# Patient Record
Sex: Female | Born: 1980 | Race: White | Hispanic: No | Marital: Married | Smoking: Former smoker
Health system: Southern US, Community
[De-identification: ages and names within clinical notes are randomized; demographics above are authoritative.]

## PROBLEM LIST (undated history)

## (undated) DIAGNOSIS — J189 Pneumonia, unspecified organism: Secondary | ICD-10-CM

## (undated) DIAGNOSIS — IMO0001 Reserved for inherently not codable concepts without codable children: Secondary | ICD-10-CM

## (undated) DIAGNOSIS — D869 Sarcoidosis, unspecified: Secondary | ICD-10-CM

## (undated) HISTORY — DX: Pneumonia, unspecified organism: J18.9

## (undated) HISTORY — PX: ESOPHAGOGASTRODUODENOSCOPY: SHX1529

---

## 1998-07-28 ENCOUNTER — Encounter: Payer: Self-pay | Admitting: Obstetrics and Gynecology

## 1998-07-28 ENCOUNTER — Ambulatory Visit (HOSPITAL_COMMUNITY): Admission: RE | Admit: 1998-07-28 | Discharge: 1998-07-28 | Payer: Self-pay | Admitting: Obstetrics and Gynecology

## 1998-09-14 ENCOUNTER — Inpatient Hospital Stay (HOSPITAL_COMMUNITY): Admission: AD | Admit: 1998-09-14 | Discharge: 1998-09-14 | Payer: Self-pay | Admitting: Obstetrics and Gynecology

## 1998-10-06 ENCOUNTER — Inpatient Hospital Stay (HOSPITAL_COMMUNITY): Admission: AD | Admit: 1998-10-06 | Discharge: 1998-10-08 | Payer: Self-pay | Admitting: Obstetrics and Gynecology

## 1998-11-18 ENCOUNTER — Other Ambulatory Visit: Admission: RE | Admit: 1998-11-18 | Discharge: 1998-11-18 | Payer: Self-pay | Admitting: Obstetrics and Gynecology

## 1999-11-29 ENCOUNTER — Encounter: Admission: RE | Admit: 1999-11-29 | Discharge: 1999-11-29 | Payer: Self-pay | Admitting: Gastroenterology

## 1999-11-29 ENCOUNTER — Encounter: Payer: Self-pay | Admitting: Gastroenterology

## 1999-12-01 ENCOUNTER — Encounter: Payer: Self-pay | Admitting: Gastroenterology

## 1999-12-01 ENCOUNTER — Ambulatory Visit (HOSPITAL_COMMUNITY): Admission: RE | Admit: 1999-12-01 | Discharge: 1999-12-01 | Payer: Self-pay | Admitting: Gastroenterology

## 1999-12-02 ENCOUNTER — Ambulatory Visit (HOSPITAL_COMMUNITY): Admission: RE | Admit: 1999-12-02 | Discharge: 1999-12-02 | Payer: Self-pay | Admitting: Gastroenterology

## 2000-05-15 ENCOUNTER — Other Ambulatory Visit: Admission: RE | Admit: 2000-05-15 | Discharge: 2000-05-15 | Payer: Self-pay | Admitting: Obstetrics and Gynecology

## 2002-03-14 ENCOUNTER — Other Ambulatory Visit: Admission: RE | Admit: 2002-03-14 | Discharge: 2002-03-14 | Payer: Self-pay | Admitting: Obstetrics & Gynecology

## 2002-03-14 ENCOUNTER — Other Ambulatory Visit: Admission: RE | Admit: 2002-03-14 | Discharge: 2002-03-14 | Payer: Self-pay | Admitting: Obstetrics and Gynecology

## 2002-07-30 ENCOUNTER — Inpatient Hospital Stay (HOSPITAL_COMMUNITY): Admission: AD | Admit: 2002-07-30 | Discharge: 2002-07-30 | Payer: Self-pay | Admitting: Obstetrics and Gynecology

## 2002-09-23 ENCOUNTER — Inpatient Hospital Stay (HOSPITAL_COMMUNITY): Admission: AD | Admit: 2002-09-23 | Discharge: 2002-09-25 | Payer: Self-pay | Admitting: Obstetrics and Gynecology

## 2002-10-29 ENCOUNTER — Other Ambulatory Visit: Admission: RE | Admit: 2002-10-29 | Discharge: 2002-10-29 | Payer: Self-pay | Admitting: Obstetrics and Gynecology

## 2004-06-30 ENCOUNTER — Other Ambulatory Visit: Admission: RE | Admit: 2004-06-30 | Discharge: 2004-06-30 | Payer: Self-pay | Admitting: Obstetrics and Gynecology

## 2004-09-21 ENCOUNTER — Encounter: Admission: RE | Admit: 2004-09-21 | Discharge: 2004-09-21 | Payer: Self-pay | Admitting: Family Medicine

## 2008-08-15 ENCOUNTER — Inpatient Hospital Stay (HOSPITAL_COMMUNITY): Admission: RE | Admit: 2008-08-15 | Discharge: 2008-08-17 | Payer: Self-pay | Admitting: Obstetrics and Gynecology

## 2010-06-14 LAB — CBC
HCT: 29 % — ABNORMAL LOW (ref 36.0–46.0)
HCT: 33.5 % — ABNORMAL LOW (ref 36.0–46.0)
Hemoglobin: 10.2 g/dL — ABNORMAL LOW (ref 12.0–15.0)
Hemoglobin: 11.8 g/dL — ABNORMAL LOW (ref 12.0–15.0)
MCHC: 35.1 g/dL (ref 30.0–36.0)
MCHC: 35.3 g/dL (ref 30.0–36.0)
MCV: 83.7 fL (ref 78.0–100.0)
MCV: 85.4 fL (ref 78.0–100.0)
Platelets: 172 10*3/uL (ref 150–400)
Platelets: 179 10*3/uL (ref 150–400)
RBC: 3.4 MIL/uL — ABNORMAL LOW (ref 3.87–5.11)
RBC: 4.01 MIL/uL (ref 3.87–5.11)
RDW: 13.1 % (ref 11.5–15.5)
RDW: 13.2 % (ref 11.5–15.5)
WBC: 10.5 10*3/uL (ref 4.0–10.5)
WBC: 12.7 10*3/uL — ABNORMAL HIGH (ref 4.0–10.5)

## 2010-06-14 LAB — RPR: RPR Ser Ql: NONREACTIVE

## 2010-07-23 NOTE — Procedures (Signed)
[place]  Patient:    Madison Holt, Madison Holt                     MRN: [id number] Proc. Date: 12/02/99 Adm. Date:  [id number] Attending:  Charna Elizabeth CC:         Meliton Rattan, M.D.   Procedure Report  DATE OF BIRTH:  May 15, 1980  PROCEDURE:  Esophagogastroduodenoscopy with gastroduodenoscopy.  ENDOSCOPIST:  Anselmo Rod, M.D.  INSTRUMENT USED:  Olympus video pan endoscope.  INDICATIONS:  Epigastric and right upper quadrant pain in a 30 year old white female who has peptic ulcer disease, esophagitis, gastritis.  PREPROCEDURE PREPARATION:  Informed consent was procured from the patient. The patient was fasted for eight hours prior to the procedure.  PREPROCEDURE PHYSICAL EXAMINATION:  VITAL SIGNS:  The patient had stable vital signs.  NECK:  Supple.  CHEST:  Clear to auscultation.  HEART:  S1 and S2 are regular.  ABDOMEN:  Soft with normal abdominal bowel sounds.  DESCRIPTION OF PROCEDURE:  The patient was placed in the left lateral decubitus position and sedated with 50 mg of Demerol and 7.5 mg of Versed intravenously.  Once the patient was adequately sedated and maintained on low flow oxygen, continuous cardiac monitoring, the Olympus video panendoscope was advanced through the mouth, over the tongue, into the esophagus under direct vision.  The entire esophagus appeared normal without evidence of ring, stricture, mass, lesions or esophagitis.  The scope was then advanced into the stomach.  There was mild antral gastritis.  No mass, polyps, erosions or ulcerations were seen.  The proximal small bowel to 70 cm appeared normal.  There was no acute outlet obstruction.  IMPRESSION:  Normal EGD except for mild antral gastritis.  RECOMMENDATIONS: 1. Continue Nexium for now. 2. Avoid all nonsteroidals. 3. Increase fluid and fiber in the diet. 4. Outpatient follow up in the next two weeks. 5. _____ p.r.n. for abdominal  spasms. DD:  11/01/99 TD:  12/03/99 Job: [id number]

## 2012-03-07 DIAGNOSIS — J189 Pneumonia, unspecified organism: Secondary | ICD-10-CM

## 2012-03-07 HISTORY — DX: Pneumonia, unspecified organism: J18.9

## 2013-02-04 ENCOUNTER — Inpatient Hospital Stay: Payer: Self-pay | Admitting: Internal Medicine

## 2013-02-04 LAB — CBC
HCT: 45 % (ref 35.0–47.0)
MCH: 29.2 pg (ref 26.0–34.0)
MCHC: 33.9 g/dL (ref 32.0–36.0)
MCV: 86 fL (ref 80–100)
RBC: 5.23 10*6/uL — ABNORMAL HIGH (ref 3.80–5.20)
RDW: 12.8 % (ref 11.5–14.5)
WBC: 11.1 10*3/uL — ABNORMAL HIGH (ref 3.6–11.0)

## 2013-02-04 LAB — BASIC METABOLIC PANEL
Anion Gap: 5 — ABNORMAL LOW (ref 7–16)
Chloride: 105 mmol/L (ref 98–107)
Co2: 25 mmol/L (ref 21–32)
EGFR (African American): >60
Glucose: 92 mg/dL (ref 65–99)
Osmolality: 272 (ref 275–301)
Sodium: 135 mmol/L — ABNORMAL LOW (ref 136–145)

## 2013-02-04 LAB — URINALYSIS, COMPLETE
Bilirubin,UR: NEGATIVE
Blood: NEGATIVE
Glucose,UR: NEGATIVE mg/dL (ref 0–75)
Hyaline Cast: 2
Nitrite: NEGATIVE
Ph: 5 (ref 4.5–8.0)
RBC,UR: 5 /HPF (ref 0–5)
Specific Gravity: 1.016 (ref 1.003–1.030)
Squamous Epithelial: 13
WBC UR: 6 /HPF (ref 0–5)

## 2013-02-05 LAB — CBC WITH DIFFERENTIAL/PLATELET
Basophil #: 0 10*3/uL (ref 0.0–0.1)
Basophil %: 0.2 %
HGB: 13.5 g/dL (ref 12.0–16.0)
Lymphocyte #: 0.5 10*3/uL — ABNORMAL LOW (ref 1.0–3.6)
Lymphocyte %: 7 %
MCH: 29.9 pg (ref 26.0–34.0)
MCHC: 34.1 g/dL (ref 32.0–36.0)
MCV: 87 fL (ref 80–100)
Monocyte %: 0.9 %
Neutrophil #: 6.5 10*3/uL (ref 1.4–6.5)
RBC: 4.53 10*6/uL (ref 3.80–5.20)
RDW: 12.7 % (ref 11.5–14.5)

## 2013-02-06 LAB — BASIC METABOLIC PANEL
BUN: 17 mg/dL (ref 7–18)
Calcium, Total: 8.7 mg/dL (ref 8.5–10.1)
Chloride: 111 mmol/L — ABNORMAL HIGH (ref 98–107)
Co2: 22 mmol/L (ref 21–32)
EGFR (Non-African Amer.): >60
Glucose: 158 mg/dL — ABNORMAL HIGH (ref 65–99)
Potassium: 4.4 mmol/L (ref 3.5–5.1)
Sodium: 141 mmol/L (ref 136–145)

## 2013-02-07 LAB — VANCOMYCIN, TROUGH: Vancomycin, Trough: 4 ug/mL — ABNORMAL LOW (ref 10–20)

## 2013-02-08 LAB — VANCOMYCIN, TROUGH: Vancomycin, Trough: 19 ug/mL (ref 10–20)

## 2013-02-09 LAB — CULTURE, BLOOD (SINGLE)

## 2014-06-27 NOTE — H&P (Signed)
PATIENT NAME:  Madison Holt, Madison Holt MR#:  [id number] DATE OF BIRTH:  Oct 09, 1980  DATE OF ADMISSION:  02/04/2013  PRIMARY CARE PHYSICIAN: None.   EMERGENCY ROOM PHYSICIAN: Dr. Cyril LoosenKinner.   CHIEF COMPLAINT: Shortness of breath and cough.   HISTORY OF PRESENT ILLNESS: A 34 year old female patient with history of shortness of breath and cough for the past 6 weeks. The patient went to urgent care 2 weeks ago and was given a course of Zithromax and prednisone along with inhalers. The patient's symptoms did not improve, so she went back to walk-in clinic again and was given Levaquin. She is almost done with Levaquin. She took 500 mg of Levaquin for 4 days and today is the 5th day, but because of persistent cough and shortness of breath, she comes to Emergency Room.   In the ER, the patient's chest x-ray showed persistent pneumonia on the right side. I was asked to admit the patient because of failure of outpatient therapy with pneumonia. The patient right now feels short of breath and feels tightness in the chest. Says that cough is so worse that her chest hurts and the pain in the chest goes to the back and always feels tight in the chest. Denies any fever now. The patient denies any recent history of travel. No history of COPD or asthma. The patient is working in a salon for a long time. Denies any exposure to different chemicals than usual. The patient states that no sick contacts.   PAST MEDICAL HISTORY: No hypertension or diabetes.   MEDICATIONS: Levaquin 500 mg daily and birth control pills.   ALLERGIES: THE PATIENT DENIES ANY ALLERGIES, BUT THAT SHE SAYS THAT SHE HAS FAMILY HISTORY OF ALLERGY TO MUCINEX, GIVES BLISTERS INSIDE THE THROAT, SO SHE DOES NOT WANT TO TAKE MUCINEX UNLESS IT IS REALLY NEEDED.   SOCIAL HISTORY: No smoking, no drinking, and the patient lives with her husband. She has 3 kids.   FAMILY HISTORY: No history of hypertension or diabetes. No COPD.   PAST SURGICAL HISTORY: None.    REVIEW OF SYSTEMS: CONSTITUTIONAL: Feels tired. Denies any fever. No weight loss.  EYES: No blurred vision.  ENT: No tinnitus. No epistaxis. No difficulty swallowing.  RESPIRATORY: Has cough and shortness of breath and painful respirations. The patient has been having cough and shortness of breath for 6 weeks and started to take antibiotics since 2 weeks.  CARDIOVASCULAR: Does have chest pain on the left side radiating to the back. No orthopnea. No pedal edema. No palpitations.  GASTROINTESTINAL: No nausea. No vomiting. No abdominal pain.  GENITOURINARY: No dysuria or hematuria.  ENDOCRINE: No polyuria or nocturia.  INTEGUMENTARY: No skin rashes.  MUSCULOSKELETAL: No joint pains.  NEUROLOGIC: No numbness or weakness. No dysarthria.  PSYCHIATRIC: No anxiety or insomnia.   PHYSICAL EXAMINATION:  VITAL SIGNS: Temperature 98.1, heart rate 94, blood pressure 159/86. Saturation is 99% on room air.  GENERAL: Alert, awake, oriented, not in distress, answering questions appropriately.  HEENT: normocephalic and atraumatic  EYes: PERLa,EOM intact. No scleral icterus. No conjunctivitis. Hearing is intact. The patient has no pharyngeal erythema.  NECK: No thyroid enlargement. No lymphadenopathy. No JVD. No carotid bruit.  RESPIRATORY: Bilaterally clear to auscultation. The patient has mild expiratory wheeze in all lung fields. Not using accessory muscles of respiration.  CARDIOVASCULAR: S1, S2 regular. No murmurs. Slightly tachycardic. Good pedal pulses, femoral pulses present. No extremity edema.  ABDOMEN: Soft, nontender, nondistended. Bowel sounds present. No hernias.  MUSCULOSKELETAL: Strength 5/5 in upper and  lower extremities. No kyphosis. No cyanosis.  SKIN: No skin rashes. Warm and dry.  LYMPH: No lymphadenopathy in cervical or axillary regions.  NEUROLOGIC: Cranial nerves II through XII are intact. Sensations are intact. Deep tendon reflexes 2+ bilaterally. No dysarthria or aphasia.   PSYCHIATRIC: Oriented to time, place, person.   LABORATORY DATA: WBC 11.1, hemoglobin 15.2, hematocrit 45, platelets 278.   ELECTROLYTES: Sodium 135, potassium 3.9, chloride 105, bicarb 25, BUN 20, creatinine 1.08, glucose 92. The patient's UA is hazy colored, trace leukocyte esterase, 3+ bacteria, and 6  WBC.   Chest x-ray shows patchy confluent increased density in the right upper lobe and also some increased density in the right lower lobe.   EKG shows normal sinus rhythm with 92 beats minute. No ST-T changes.   ASSESSMENT AND PLAN: The patient is a 34 year old female patient with community-acquired pneumonia, failed outpatient therapy. The patient still has right side pneumonia. The patient is admitted to hospitalist service on telemetry. Continue vancomycin and Zosyn and continue Solu-Medrol IV along with DuoNebs and follow how she responds.   THE PATIENT SAID SHE CANNOT TAKE CODEINE-CONTAINING COUGH PREPARATION, SO WE WILL HOLD ANY TUSSIONEX AT THIS TIME. Will give only Tessalon Perles for her cough as needed.   Continue gastrointestinal prophylaxis with proton pump inhibitors, \  , deep vein thrombosis prophylaxis with early ambulation.   TIME SPENT: About 55 minutes on history and physical.   ____________________________ Katha Hamming, MD sk:np D: 02/04/2013 16:33:17 ET T: 02/04/2013 20:12:38 ET JOB#: [id number]  cc: Katha Hamming, MD, <Dictator> Katha Hamming MD ELECTRONICALLY SIGNED 02/13/2013 23:01

## 2014-06-27 NOTE — Discharge Summary (Signed)
PATIENT NAME:  Madison Holt, Fayola MR#:  [id number] DATE OF BIRTH:  [birth year]  DATE OF ADMISSION:  02/04/2013 DATE OF DISCHARGE:  02/08/2013  DISCHARGE DIAGNOSES:  1. Pneumonia.  2. Left lung 5 mm nodule, needs to have followup.  3. Chest pain due to pneumonia.   CONDITION ON DISCHARGE: Stable.   CODE STATUS: FULL CODE.   MEDICATIONS ON DISCHARGE:  1. Prednisone 10 mg oral tablet, start at 60 and taper by 10 mg until complete.  2. Tramadol 50 mg oral 3 to 4 times a day as needed for pain.  3. Amoxicillin/clavulanate 500/125 mg oral tablet every 8 hours for 5 days.  4. Montelukast 10 mg oral tablet once a day.  5. Pantoprazole 40 mg delayed release tablet once a day.  6. Dextromethorphan/guaifenesin oral syrup 5 mL every 4 to 6 hours as needed for cough.   DIET ON DISCHARGE: Regular diet, consistency regular.   TIMEFRAME TO FOLLOW UP: Within 4 to 6 weeks. Advised to follow with PMD in 3 to 5 months. Followup chest x-ray for 5 mm nodule in left lung.   HISTORY OF PRESENTING ILLNESS: As per Dr. Luberta MutterKonidena on 02/04/2013. This is 34 year old female with history of shortness of breath and cough for 6 weeks. Went to urgent care center 3 weeks ago and was given a dose of Zithromax and prednisone with inhaler. Symptoms did not improve. She went back to walk-in clinic and was given Levaquin. She almost finished 4 days of Levaquin, but still she had cough and shortness of breath so decided to come to the Emergency Room. There was some pneumonia on the right side, so she was admitted for pneumonia, failure of outpatient therapy for further management. As she failed routine therapy, we started her on vancomycin and Zosyn IV and kept her in the hospital for symptomatic management of her cough and chest pain. She had slow but gradual improvement in her condition over next 4 days, and she was feeling significantly better, so we discharged her home on Augmentin to finish a total 10 days of therapy.   IMPORTANT  LABORATORY RESULTS IN THE HOSPITAL: White cell count on admission was 11,000. Hemoglobin was 15.2. Troponin less than 0.02. Creatinine 1.08. Urinalysis grossly negative. Chest x-ray, PA and lateral, showed patchy confluent increased density in right upper lobe with minimal increased density in the right lower lobe. May reflect residual pneumonia. Blood cultures were negative. Chest x-ray, PA and lateral, which was done on the 4th of December showed peripheral opacification over the right upper lobe without significant change which may be chronic, although cannot exclude acute infectious process. A 5 mm nodular opacity in left lung.   TOTAL TIME SPENT ON THIS DISCHARGE: 40 minutes.   ____________________________ Hope PigeonVaibhavkumar G. Elisabeth PigeonVachhani, MD vgv:gb D: 02/10/2013 18:02:01 ET T: 02/11/2013 00:30:10 ET JOB#: [id number]  cc: Hope PigeonVaibhavkumar G. Elisabeth PigeonVachhani, MD, <Dictator> Altamese DillingVAIBHAVKUMAR Shalia Bartko MD ELECTRONICALLY SIGNED 02/12/2013 9:22

## 2015-03-23 ENCOUNTER — Encounter: Payer: Self-pay | Admitting: Family Medicine

## 2015-03-23 ENCOUNTER — Ambulatory Visit (INDEPENDENT_AMBULATORY_CARE_PROVIDER_SITE_OTHER): Payer: Self-pay | Admitting: Family Medicine

## 2015-03-23 VITALS — BP 122/84 | HR 96 | Temp 98.3°F | Ht 64.0 in | Wt 142.8 lb

## 2015-03-23 DIAGNOSIS — R5383 Other fatigue: Secondary | ICD-10-CM

## 2015-03-23 DIAGNOSIS — Z13220 Encounter for screening for lipoid disorders: Secondary | ICD-10-CM

## 2015-03-23 DIAGNOSIS — R0600 Dyspnea, unspecified: Secondary | ICD-10-CM

## 2015-03-23 DIAGNOSIS — G4489 Other headache syndrome: Secondary | ICD-10-CM

## 2015-03-23 DIAGNOSIS — Z114 Encounter for screening for human immunodeficiency virus [HIV]: Secondary | ICD-10-CM

## 2015-03-23 DIAGNOSIS — R1011 Right upper quadrant pain: Secondary | ICD-10-CM

## 2015-03-23 NOTE — Progress Notes (Signed)
Dr. Karleen Hampshire T. Trae Bovenzi, MD, CAQ Sports Medicine [facility] and Sports Medicine [address] [place], [address] Phone: [phone] Fax: [phone]  03/23/2015  Patient: Madison Holt, MRN: [id number], DOB: 1980/06/03, 35 y.o.  Primary Physician:  Hannah Beat, MD   Chief Complaint  Patient presents with  . Establish Care  . Nasal Congestion  . Emesis    past 3 months   Subjective:   Madison Holt is a 35 y.o. very pleasant female patient who presents with the following:   pleasant young woman who presents with primary complaints of intermittent shortness of breath over the last few months with any short of breath going up and down her stairs , and a history that is significant for pneumonia 2 years prior where she was hospitalized for 7 days. No known history of COPD or asthma, and no significant smoking history.   She also has been having some intermittent right upper quadrant pain, nauseousness, emesis , and this is been ongoing for at least 2 or 3 months.  Oct, nov, dec throwing up - sick and was throwing up. Gave an antibiotic. This time the only thing is congestion. Has been having some breathing issues. Chest will be hurting and tired.   After pneumonia, had to see a lung doctor - cleared up and went away. In the fall, went away. Trouble going up the steps.  Breathing has been worse and worse since august.   Hot and cold that will happen.   Normal periods.   Emesis - not since christmas, will get nauseous.  Oct - 3 days straight.  Nov, 24 hours.  3 weeks ago.  103 fever at the time.   Will also get some RUQ pain.   Blurred vision. Ha.    Past Medical History, Surgical History, Social History, Family History, Problem List, Medications, and Allergies have been reviewed and updated if relevant.  There are no active problems to display for this patient.   Past Medical History  Diagnosis Date  . Pneumonia 2014    Hosp x 1 week     History reviewed. No pertinent past surgical history.  Social History   Social History  . Marital Status: Divorced    Spouse Name: N/A  . Number of Children: 3  . Years of Education: N/A   Occupational History  . Salon coordinator    Social History Main Topics  . Smoking status: Never Smoker   . Smokeless tobacco: Not on file  . Alcohol Use: 1.2 oz/week    2 Standard drinks or equivalent per week     Comment: soical  . Drug Use: Not on file  . Sexual Activity:    Partners: Male    Pharmacist, hospital Protection: Surgical     Comment: Vasectomy   Other Topics Concern  . Not on file   Social History Narrative   Works as Engineer, petroleum    Children: All boys, 16, 12, 6   Kay and Onalee Hua Hunter's daughter    Family History  Problem Relation Age of Onset  . Arthritis Mother   . Hyperlipidemia Mother   . Hyperlipidemia Father   . Diabetes Father   . Breast cancer Maternal Grandmother   . Hypertension Maternal Grandmother   . Colon cancer Maternal Grandfather   . Hypertension Maternal Grandfather   . Heart disease Paternal Grandfather     Allergies  Allergen Reactions  . Codeine     Other reaction(s): Hallucination  [facility]   Med[facility] and [facility].  ROS: GEN: Acute illness details above GI: Tolerating PO intake GU: maintaining adequate hydration and urination Pulm: No SOB Interactive and getting along well at home.  Otherwise, ROS is as per the HPI.   Objective:   BP 122/84 mmHg  Pulse 96  Temp(Src) 98.3 F (36.8 C) (Oral)  Ht 5\' 4"  (1.626 m)  Wt 142 lb 12.8 oz (64.774 kg)  BMI 24.50 kg/m2  SpO2 98%  LMP 03/01/2015  GEN: WDWN, NAD, Non-toxic, A & O x 3 HEENT: Atraumatic, Normocephalic. Neck supple. No masses, No LAD. Ears and Nose: No external deformity. CV: RRR, No M/G/R. No JVD. No thrill. No extra heart sounds. PULM: CTA B, no wheezes,  crackles, rhonchi. No retractions. No resp. distress. No accessory muscle use. ABD: S, NT, ND, +BS. No rebound. No HSM. EXTR: No c/c/e NEURO Normal gait.  PSYCH: Normally interactive. Conversant. Not depressed or anxious appearing.  Calm demeanor.     Laboratory and Imaging Data:  Assessment and Plan:   Dyspnea  Other fatigue - Plan: Vitamin B12, Basic metabolic panel, CBC with Differential/Platelet, Hepatic function panel, TSH, CANCELED: Basic metabolic panel, CANCELED: CBC with Differential/Platelet, CANCELED: Hepatic function panel, CANCELED: TSH, CANCELED: Vitamin B12  Screening, lipid - Plan: Lipid panel, CANCELED: Lipid panel  Screening for HIV (human immunodeficiency virus) - Plan: HIV antibody, CANCELED: HIV antibody  Abdominal pain, right upper quadrant - Plan: Hepatic function panel, Lipase, CANCELED: Hepatic function panel, CANCELED: Lipase  Other headache syndrome   multiple complaints of unclear origin. Dyspnea of unclear origin. Consideration of spirometry and chest x-ray.    for this and above, we are going to wait for 2 weeks until the patient's medical insurance becomes in effect.   fatigue, abdominal pain, headache, unclear origin. Check basic laboratories as above. Given cyclical abdominal pain and nauseousness with emesis, cholelithiasis that is symptomatic would be high in the differential.    intermittent migraine may also be possible.  Follow-up: after labs  Orders Placed This Encounter  Procedures  . Vitamin B12  . Basic metabolic panel  . CBC with Differential/Platelet  . Hepatic function panel  . TSH  . Lipid panel  . HIV antibody  . Lipase    Signed,  Madison Luebbe T. Kaiel Weide, MD   Patient's Medications   No medications on file

## 2015-04-10 ENCOUNTER — Other Ambulatory Visit (INDEPENDENT_AMBULATORY_CARE_PROVIDER_SITE_OTHER): Payer: BLUE CROSS/BLUE SHIELD

## 2015-04-10 DIAGNOSIS — R5383 Other fatigue: Secondary | ICD-10-CM

## 2015-04-10 DIAGNOSIS — Z114 Encounter for screening for human immunodeficiency virus [HIV]: Secondary | ICD-10-CM

## 2015-04-10 DIAGNOSIS — R1011 Right upper quadrant pain: Secondary | ICD-10-CM

## 2015-04-10 DIAGNOSIS — Z13220 Encounter for screening for lipoid disorders: Secondary | ICD-10-CM | POA: Diagnosis not present

## 2015-04-10 LAB — LIPID PANEL
CHOLESTEROL: 132 mg/dL (ref 0–200)
HDL: 68.6 mg/dL (ref >39.00)
LDL Cholesterol: 51 mg/dL (ref 0–99)
NonHDL: 63.7
Total CHOL/HDL Ratio: 2
Triglycerides: 63 mg/dL (ref 0.0–149.0)
VLDL: 12.6 mg/dL (ref 0.0–40.0)

## 2015-04-10 LAB — CBC WITH DIFFERENTIAL/PLATELET
BASOS PCT: 0.4 % (ref 0.0–3.0)
Basophils Absolute: 0 10*3/uL (ref 0.0–0.1)
Eosinophils Absolute: 0.1 10*3/uL (ref 0.0–0.7)
Eosinophils Relative: 2 % (ref 0.0–5.0)
HCT: 43.4 % (ref 36.0–46.0)
HEMOGLOBIN: 14.6 g/dL (ref 12.0–15.0)
LYMPHS ABS: 1 10*3/uL (ref 0.7–4.0)
LYMPHS PCT: 15.2 % (ref 12.0–46.0)
MCHC: 33.6 g/dL (ref 30.0–36.0)
MCV: 87.4 fl (ref 78.0–100.0)
MONO ABS: 0.5 10*3/uL (ref 0.1–1.0)
MONOS PCT: 7.5 % (ref 3.0–12.0)
Neutro Abs: 4.8 10*3/uL (ref 1.4–7.7)
Neutrophils Relative %: 74.9 % (ref 43.0–77.0)
Platelets: 257 10*3/uL (ref 150.0–400.0)
RBC: 4.96 Mil/uL (ref 3.87–5.11)
RDW: 13.1 % (ref 11.5–15.5)
WBC: 6.4 10*3/uL (ref 4.0–10.5)

## 2015-04-10 LAB — BASIC METABOLIC PANEL
BUN: 23 mg/dL (ref 6–23)
CALCIUM: 9.8 mg/dL (ref 8.4–10.5)
CO2: 27 meq/L (ref 19–32)
CREATININE: 1.1 mg/dL (ref 0.40–1.20)
Chloride: 102 mEq/L (ref 96–112)
GFR: 59.7 mL/min — ABNORMAL LOW (ref >60.00)
Glucose, Bld: 106 mg/dL — ABNORMAL HIGH (ref 70–99)
Potassium: 4.5 mEq/L (ref 3.5–5.1)
SODIUM: 137 meq/L (ref 135–145)

## 2015-04-10 LAB — HEPATIC FUNCTION PANEL
ALBUMIN: 4.3 g/dL (ref 3.5–5.2)
ALT: 14 U/L (ref 0–35)
AST: 16 U/L (ref 0–37)
Alkaline Phosphatase: 61 U/L (ref 39–117)
BILIRUBIN TOTAL: 0.5 mg/dL (ref 0.2–1.2)
Bilirubin, Direct: 0.1 mg/dL (ref 0.0–0.3)
Total Protein: 7.8 g/dL (ref 6.0–8.3)

## 2015-04-10 LAB — VITAMIN B12: VITAMIN B 12: 287 pg/mL (ref 211–911)

## 2015-04-10 LAB — TSH: TSH: 1.58 u[IU]/mL (ref 0.35–4.50)

## 2015-04-10 LAB — LIPASE: Lipase: 26 U/L (ref 11.0–59.0)

## 2015-04-11 LAB — HIV ANTIBODY (ROUTINE TESTING W REFLEX): HIV: NONREACTIVE

## 2015-04-15 ENCOUNTER — Encounter: Payer: Self-pay | Admitting: Family Medicine

## 2015-04-15 NOTE — Telephone Encounter (Signed)
Rena, Please call and triage patient. 

## 2015-04-27 ENCOUNTER — Ambulatory Visit (INDEPENDENT_AMBULATORY_CARE_PROVIDER_SITE_OTHER)
Admission: RE | Admit: 2015-04-27 | Discharge: 2015-04-27 | Disposition: A | Discharge status: Home / Self Care | Payer: BLUE CROSS/BLUE SHIELD | Source: Ambulatory Visit | Attending: Family Medicine | Admitting: Family Medicine

## 2015-04-27 ENCOUNTER — Encounter: Payer: Self-pay | Admitting: Family Medicine

## 2015-04-27 ENCOUNTER — Ambulatory Visit (INDEPENDENT_AMBULATORY_CARE_PROVIDER_SITE_OTHER): Payer: BLUE CROSS/BLUE SHIELD | Admitting: Family Medicine

## 2015-04-27 VITALS — BP 110/80 | HR 74 | Temp 98.4°F | Ht 64.0 in | Wt 145.5 lb

## 2015-04-27 DIAGNOSIS — R1011 Right upper quadrant pain: Secondary | ICD-10-CM

## 2015-04-27 DIAGNOSIS — K920 Hematemesis: Secondary | ICD-10-CM | POA: Diagnosis not present

## 2015-04-27 DIAGNOSIS — R0600 Dyspnea, unspecified: Secondary | ICD-10-CM

## 2015-04-27 DIAGNOSIS — R1010 Upper abdominal pain, unspecified: Secondary | ICD-10-CM | POA: Diagnosis not present

## 2015-04-27 DIAGNOSIS — R0609 Other forms of dyspnea: Secondary | ICD-10-CM | POA: Diagnosis not present

## 2015-04-27 DIAGNOSIS — G8929 Other chronic pain: Secondary | ICD-10-CM

## 2015-04-27 DIAGNOSIS — J849 Interstitial pulmonary disease, unspecified: Secondary | ICD-10-CM

## 2015-04-27 DIAGNOSIS — R110 Nausea: Secondary | ICD-10-CM

## 2015-04-27 DIAGNOSIS — E538 Deficiency of other specified B group vitamins: Secondary | ICD-10-CM

## 2015-04-27 MED ORDER — ALBUTEROL SULFATE (2.5 MG/3ML) 0.083% IN NEBU
2.5000 mg | INHALATION_SOLUTION | Freq: Once | RESPIRATORY_TRACT | Status: AC
  Administered 2015-04-27: 2.5 mg via RESPIRATORY_TRACT

## 2015-04-27 MED ORDER — FLUTICASONE-SALMETEROL 100-50 MCG/DOSE IN AEPB: 1.0000 | INHALATION_SPRAY | Freq: Two times a day (BID) | RESPIRATORY_TRACT | Status: DC

## 2015-04-27 MED ORDER — OMEPRAZOLE 20 MG PO CPDR: 20.0000 mg | DELAYED_RELEASE_CAPSULE | Freq: Two times a day (BID) | ORAL | Status: DC

## 2015-04-27 NOTE — Progress Notes (Signed)
Dr. Karleen Hampshire T. Dorann Davidson, MD, CAQ Sports [facility] Sports [facility] [address] [place], [address] Phone: [phone] Fax: [phone]  04/27/2015  Patient: Madison Holt, MRN: [id number], DOB: Apr 03, 1980, 35 y.o.  Primary Physician:  Hannah Beat, MD   Chief Complaint  Patient presents with  . Follow-up   Subjective:   Madison Holt is a 35 y.o. very pleasant female patient who presents with the following:  F/u ongoing multiple issues.   1. 04/15/2015: Lambert Mody pain - had some chest pain the night before and severe stomach pain the night before. Then bright red blood - the night. This has not recurred, but the patient did vomit up quite a bit of blood at that time.  This was bright red.  She denies taking in a significant amount of NSAIDs.  2. Dyspnea on exertion, dyspnea, minimal effort.  The patient reports that she has been short of breath for at least a couple of years with minimal exertion.  She is only able to walk up one flight of stairs, and then she gets short of breath very significantly.  Does not bother her quite as much when she is walking on flat surfaces.  She was sick earlier in the year from about October through early January off and on.  She did spirometry at Dr. Reita Cliche office in 2014, and that appears to have shown a moderate restrictive pattern.  She was placed on Dulera inhalers, and by memory she improved on them.  Still getting shortness of breath - only 1 flight of stairs.  Flat surfaces does not bother her.   Spirometry: pre and post. 2015 with obstructive pattern. Gold 2 pattern.  03/23/2015 Last OV with Hannah Beat, MD   pleasant young woman who presents with primary complaints of intermittent shortness of breath over the last few months with any short of breath going up and down her stairs , and a history that is significant for pneumonia 2 years prior where she was hospitalized for 7 days. No known history of COPD  or asthma, and no significant smoking history.   She also has been having some intermittent right upper quadrant pain, nauseousness, emesis , and this is been ongoing for at least 2 or 3 months.  Oct, nov, dec throwing up - sick and was throwing up. Gave an antibiotic. This time the only thing is congestion. Has been having some breathing issues. Chest will be hurting and tired.   After pneumonia, had to see a lung doctor - cleared up and went away. In the fall, went away. Trouble going up the steps.  Breathing has been worse and worse since august.   Hot and cold that will happen.   Normal periods.   Emesis - not since christmas, will get nauseous.  Oct - 3 days straight.  Nov, 24 hours.  3 weeks ago.  103 fever at the time.   Will also get some RUQ pain.   Blurred vision. Ha.  Past Medical History, Surgical History, Social History, Family History, Problem List, Medications, and Allergies have been reviewed and updated if relevant.  There are no active problems to display for this patient.   Past Medical History  Diagnosis Date  . Pneumonia 2014    Hosp x 1 week    No past surgical history on file.  Social History   Social History  . Marital Status: Divorced    Spouse Name: N/A  . Number of Children: 3  .  Years of Education: N/A   Occupational History  . Salon coordinator    Social History Main Topics  . Smoking status: Never Smoker   . Smokeless tobacco: Never Used  . Alcohol Use: 1.2 oz/week    2 Standard drinks or equivalent per week     Comment: soical  . Drug Use: No  . Sexual Activity:    Partners: Male    Birth Control/ Protection: Surgical     Comment: Vasectomy   Other Topics Concern  . Not on file   Social History Narrative   Works as Engineer, petroleum    Children: All boys, 16, 12, 6   Kay and Onalee Hua Hunter's daughter    Family History  Problem Relation Age of Onset  . Arthritis Mother   . Hyperlipidemia Mother   . Hyperlipidemia  Father   . Diabetes Father   . Breast cancer Maternal Grandmother   . Hypertension Maternal Grandmother   . Colon cancer Maternal Grandfather   . Hypertension Maternal Grandfather   . Heart disease Paternal Grandfather     Allergies  Allergen Reactions  . Codeine     Other reaction(s): Hallucination    Medication list reviewed and updated in full in [facility].   Medication list reviewed and updated in full in [facility].  ROS: GEN: Acute illness details above GI: Tolerating PO intake - bloody emesis as above GU: maintaining adequate hydration and urination Pulm: SOB as above Interactive and getting along well at home.  Otherwise, ROS is as per the HPI.  Objective:   BP 110/80 mmHg  Pulse 74  Temp(Src) 98.4 F (36.9 C) (Oral)  Ht 5\' 4"  (1.626 m)  Wt 145 lb 8 oz (65.998 kg)  BMI 24.96 kg/m2  LMP 04/19/2015  GEN: WDWN, NAD, Non-toxic, A & O x 3 HEENT: Atraumatic, Normocephalic. Neck supple. No masses, No LAD. Ears and Nose: No external deformity. CV: RRR, No M/G/R. No JVD. No thrill. No extra heart sounds. PULM: CTA B, no wheezes, crackles, rhonchi. No retractions. No resp. distress. No accessory muscle use. ABD: S, NT, ND, +BS. No rebound. No HSM. EXTR: No c/c/e NEURO Normal gait.  PSYCH: Normally interactive. Conversant. Not depressed or anxious appearing.  Calm demeanor.     Laboratory and Imaging Data: Results for orders placed or performed in visit on 04/10/15  Vitamin B12  Result Value Ref Range   Vitamin B-12 287 211 - 911 pg/mL  Basic metabolic panel  Result Value Ref Range   Sodium 137 135 - 145 mEq/L   Potassium 4.5 3.5 - 5.1 mEq/L   Chloride 102 96 - 112 mEq/L   CO2 27 19 - 32 mEq/L   Glucose, Bld 106 (H) 70 - 99 mg/dL   BUN 23 6 - 23 mg/dL   Creatinine, Ser 1.61 0.40 - 1.20 mg/dL   Calcium 9.8 8.4 - 09.6 mg/dL   GFR 04.54 (L) >09.81 mL/min  CBC with Differential/Platelet  Result Value Ref Range   WBC 6.4 4.0 - 10.5 K/uL   RBC  4.96 3.87 - 5.11 Mil/uL   Hemoglobin 14.6 12.0 - 15.0 g/dL   HCT 19.1 47.8 - 29.5 %   MCV 87.4 78.0 - 100.0 fl   MCHC 33.6 30.0 - 36.0 g/dL   RDW 62.1 30.8 - 65.7 %   Platelets 257.0 150.0 - 400.0 K/uL   Neutrophils Relative % 74.9 43.0 - 77.0 %   Lymphocytes Relative 15.2 12.0 - 46.0 %  Monocytes Relative 7.5 3.0 - 12.0 %   Eosinophils Relative 2.0 0.0 - 5.0 %   Basophils Relative 0.4 0.0 - 3.0 %   Neutro Abs 4.8 1.4 - 7.7 K/uL   Lymphs Abs 1.0 0.7 - 4.0 K/uL   Monocytes Absolute 0.5 0.1 - 1.0 K/uL   Eosinophils Absolute 0.1 0.0 - 0.7 K/uL   Basophils Absolute 0.0 0.0 - 0.1 K/uL  Hepatic function panel  Result Value Ref Range   Total Bilirubin 0.5 0.2 - 1.2 mg/dL   Bilirubin, Direct 0.1 0.0 - 0.3 mg/dL   Alkaline Phosphatase 61 39 - 117 U/L   AST 16 0 - 37 U/L   ALT 14 0 - 35 U/L   Total Protein 7.8 6.0 - 8.3 g/dL   Albumin 4.3 3.5 - 5.2 g/dL  TSH  Result Value Ref Range   TSH 1.58 0.35 - 4.50 uIU/mL  Lipid panel  Result Value Ref Range   Cholesterol 132 0 - 200 mg/dL   Triglycerides 13.2 0.0 - 149.0 mg/dL   HDL 44.01 >02.72 mg/dL   VLDL 53.6 0.0 - 64.4 mg/dL   LDL Cholesterol 51 0 - 99 mg/dL   Total CHOL/HDL Ratio 2    NonHDL 63.70   HIV antibody  Result Value Ref Range   HIV 1&2 Ab, 4th Generation NONREACTIVE NONREACTIVE  Lipase  Result Value Ref Range   Lipase 26.0 11.0 - 59.0 U/L    Dg Chest 2 View  04/27/2015  CLINICAL DATA:  COPD. EXAM: CHEST  2 VIEW COMPARISON:  02/07/2013.  02/04/2013. FINDINGS: Mediastinum stable. Heart size stable. Mild bilateral hilar fullness noted, adenopathy cannot be excluded . Diffuse bilateral pulmonary interstitial prominence noted. This suggests an active interstitial process including pneumonitis. Right upper lobe subsegmental atelectasis. Questionable nodular density noted in the left lung base. Previously identified nodule left mid lung field no longer identified. IMPRESSION: 1. Diffuse pulmonary interstitial prominence consistent  with active interstitial lung disease. Atelectatic changes right upper lobe. 2. Mild bilateral hilar adenopathy cannot be excluded. 3. Possible nodule left lung base. Previously identified nodule left mid lung no longer identified . Follow-up chest x-rays to demonstrate clearing suggested. Electronically Signed   By: Maisie Fus  Register   On: 04/27/2015 09:53     Assessment and Plan:   Interstitial lung disease (HCC)  Hematemesis with nausea - Plan: Ambulatory referral to Gastroenterology  Abdominal pain, chronic, right upper quadrant - Plan: Ambulatory referral to Gastroenterology  Dyspnea on exertion - Plan: Spirometry with graph, albuterol (PROVENTIL) (2.5 MG/3ML) 0.083% nebulizer solution 2.5 mg, DG Chest 2 View  B12 deficiency  Spirometry pre and post shows FEV1 / FVC at 72 No improvement post-bronchodilator FEV1/FVC in 2014 was in the 50's at Dr. Reita Cliche office  Concern for potential interstitial lung disease on x-ray. Obstructive pattern on spirometry may be from this. Pulmonology will need to be involved. Dr. Loreta Ave saw the patient today, and has already consulted Dr. Marchelle Gearing who is seeing the patient on 05/11/2015.  Begin b12 oral supplementation.   I started her on PPI for presumed ulcer vs gastritis causing bleeding and consulted GI.  Follow-up: Return in about 1 month (around 05/25/2015).  New Prescriptions   FLUTICASONE-SALMETEROL (ADVAIR) 100-50 MCG/DOSE AEPB    Inhale 1 puff into the lungs 2 (two) times daily.   OMEPRAZOLE (PRILOSEC) 20 MG CAPSULE    Take 1 capsule (20 mg total) by mouth 2 (two) times daily before a meal.   Orders Placed This Encounter  Procedures  .  DG Chest 2 View  . Ambulatory referral to Gastroenterology  . Spirometry with graph    Signed,  Karleen Hampshire T. Kareem Aul, MD   Patient's Medications  New Prescriptions   FLUTICASONE-SALMETEROL (ADVAIR) 100-50 MCG/DOSE AEPB    Inhale 1 puff into the lungs 2 (two) times daily.   OMEPRAZOLE (PRILOSEC) 20  MG CAPSULE    Take 1 capsule (20 mg total) by mouth 2 (two) times daily before a meal.  Previous Medications   No medications on file  Modified Medications   No medications on file  Discontinued Medications   No medications on file

## 2015-04-27 NOTE — Patient Instructions (Signed)
b12 - low normal deficiency

## 2015-04-27 NOTE — Progress Notes (Signed)
Pre visit review using our clinic review tool, if applicable. No additional management support is needed unless otherwise documented below in the visit note. 

## 2015-04-28 ENCOUNTER — Telehealth: Payer: Self-pay | Admitting: Family Medicine

## 2015-04-28 ENCOUNTER — Telehealth: Payer: Self-pay | Admitting: Internal Medicine

## 2015-04-28 DIAGNOSIS — J849 Interstitial pulmonary disease, unspecified: Secondary | ICD-10-CM

## 2015-04-28 NOTE — Telephone Encounter (Signed)
Attempted call, LMOM. Will try again.

## 2015-04-28 NOTE — Telephone Encounter (Signed)
Will hold in triage as TD is looking at scheduled for 3/6-3/7 to be opened prior to calling pt

## 2015-04-28 NOTE — Telephone Encounter (Signed)
Patient scheduled for Consult with MR 05/14/15 at 9am Pt plans to come next week to have labs drawn in our lab - all orders requested have been placed.  PFT order placed - aware that someone will call back to schedule this appt.  HRCT order placed - aware that someone will call back to schedule this appt as well.  Will hold in triage to follow up.

## 2015-04-28 NOTE — Telephone Encounter (Signed)
See my result note. I did my best to explain and go over everything with her.

## 2015-04-28 NOTE — Telephone Encounter (Signed)
Patient Returned call   [phone]

## 2015-04-28 NOTE — Telephone Encounter (Signed)
Called pt and LMTCB x1 Schedule now open. Will order tests after speaking with pt.

## 2015-04-28 NOTE — Telephone Encounter (Signed)
Pt walked in and would like results from chest x-ray.  States she has gotten "bad news" from Dr. Loreta Ave and has been referred to pulmonary.  Pt is requesting a call back ASAP.  Best number to call is [phone]

## 2015-04-28 NOTE — Telephone Encounter (Signed)
Dr Collene Mares referred me this patient 11:38 AM 04/28/2015 - has abnormal cxr ILD with mediastinal node and B symptoms of FUO. Went to GI with NV but she wants me to see patient first  Please call and give patien appt 05/11/15 or 05/12/15 to see me as new consult. However, beore she sees me  1) Do HRCT chest first and then CT chest with contrast - both at same setting - befoe she seems me High Resolution CT chest without contrast on ILD protocol. Only  Dr Lorin Picket or Dr Salvatore Marvel Dr. Vinnie Langton to read  2) Do full PFT before she sees me   3) Serum: ESR, ACE, ANA, DS-DNA, RF, anti-CCP, ssA, ssB, scl-70, ANCA screen, MPO, PR-3, Total CK,  RNP, Aldolase,  Hypersensitivity Pneumonitis Panel - beore she ses me  Diagnosis for all of above - ILD   Thanks  Dr. Brand Males, M.D., [facility].C.P Pulmonary and Critical Care Medicine Staff Physician [facility] Pager: [phone], If no answer or between  15:00h - 7:00h: call [phone]  04/28/2015 11:41 AM

## 2015-04-29 ENCOUNTER — Telehealth: Payer: Self-pay | Admitting: Internal Medicine

## 2015-04-29 DIAGNOSIS — R109 Unspecified abdominal pain: Secondary | ICD-10-CM

## 2015-04-29 DIAGNOSIS — R112 Nausea with vomiting, unspecified: Secondary | ICD-10-CM

## 2015-04-29 DIAGNOSIS — K920 Hematemesis: Secondary | ICD-10-CM

## 2015-04-29 NOTE — Telephone Encounter (Signed)
Dr Loreta Ave called saying she wants to tag CT abd/pevlis with contrast when she [name] does CT chest.   Dx is Nausea. Vomit, abd pain and hematemesis. Please ensure and confirm with me

## 2015-04-29 NOTE — Telephone Encounter (Signed)
Please advise PCC's on orders placed. thanks

## 2015-04-29 NOTE — Telephone Encounter (Signed)
I spoke to pt.  She is planning to come here Monday morning to get her lab work done.  We had pft opening at 9:00 so I scheduled her for that.  She is to have her CT's done on 3/1 at 3:00 at [facility].  Gave pt info for CT appt & pft appt.  Nothing further needed.

## 2015-04-30 NOTE — Telephone Encounter (Signed)
Abd/pelvic ct added and precert pending Tobe Sos

## 2015-04-30 NOTE — Telephone Encounter (Signed)
Order has been placed and spoke with Almyra Free as she is aware. MR is wanting confirmation this is scheduled thanks

## 2015-05-04 ENCOUNTER — Ambulatory Visit (INDEPENDENT_AMBULATORY_CARE_PROVIDER_SITE_OTHER): Payer: BLUE CROSS/BLUE SHIELD | Admitting: Internal Medicine

## 2015-05-04 DIAGNOSIS — J849 Interstitial pulmonary disease, unspecified: Secondary | ICD-10-CM | POA: Diagnosis not present

## 2015-05-04 LAB — PULMONARY FUNCTION TEST
DL/VA % pred: 93 %
DL/VA: 4.48 ml/min/mmHg/L
DLCO UNC % PRED: 88 %
DLCO UNC: 21.37 ml/min/mmHg
FEF 25-75 PRE: 1.73 L/s
FEF 25-75 Post: 2.51 L/sec
FEF2575-%Change-Post: 45 %
FEF2575-%PRED-PRE: 53 %
FEF2575-%Pred-Post: 76 %
FEV1-%CHANGE-POST: 9 %
FEV1-%PRED-POST: 95 %
FEV1-%Pred-Pre: 87 %
FEV1-Post: 2.95 L
FEV1-Pre: 2.69 L
FEV1FVC-%Change-Post: 8 %
FEV1FVC-%Pred-Pre: 84 %
FEV6-%Change-Post: 1 %
FEV6-%Pred-Post: 106 %
FEV6-%Pred-Pre: 104 %
FEV6-PRE: 3.86 L
FEV6-Post: 3.9 L
FEV6FVC-%PRED-PRE: 102 %
FEV6FVC-%Pred-Post: 102 %
FVC-%Change-Post: 1 %
FVC-%Pred-Post: 104 %
FVC-%Pred-Pre: 103 %
FVC-Post: 3.9 L
FVC-Pre: 3.86 L
POST FEV1/FVC RATIO: 76 %
POST FEV6/FVC RATIO: 100 %
Pre FEV1/FVC ratio: 70 %
Pre FEV6/FVC Ratio: 100 %
RV % pred: 100 %
RV: 1.51 L
TLC % PRED: 103 %
TLC: 5.21 L

## 2015-05-04 NOTE — Progress Notes (Signed)
PFT done today. 05/04/2015 

## 2015-05-06 ENCOUNTER — Ambulatory Visit (INDEPENDENT_AMBULATORY_CARE_PROVIDER_SITE_OTHER)
Admission: RE | Admit: 2015-05-06 | Discharge: 2015-05-06 | Disposition: A | Discharge status: Home / Self Care | Payer: BLUE CROSS/BLUE SHIELD | Source: Ambulatory Visit | Attending: Internal Medicine | Admitting: Internal Medicine

## 2015-05-06 ENCOUNTER — Encounter (HOSPITAL_COMMUNITY): Payer: BLUE CROSS/BLUE SHIELD

## 2015-05-06 ENCOUNTER — Other Ambulatory Visit: Payer: BLUE CROSS/BLUE SHIELD

## 2015-05-06 ENCOUNTER — Ambulatory Visit
Admission: RE | Admit: 2015-05-06 | Discharge: 2015-05-06 | Disposition: A | Discharge status: Home / Self Care | Payer: BLUE CROSS/BLUE SHIELD | Source: Ambulatory Visit | Attending: Internal Medicine | Admitting: Internal Medicine

## 2015-05-06 DIAGNOSIS — J849 Interstitial pulmonary disease, unspecified: Secondary | ICD-10-CM | POA: Diagnosis not present

## 2015-05-06 DIAGNOSIS — R109 Unspecified abdominal pain: Secondary | ICD-10-CM

## 2015-05-06 DIAGNOSIS — J8409 Other alveolar and parieto-alveolar conditions: Secondary | ICD-10-CM

## 2015-05-06 DIAGNOSIS — K920 Hematemesis: Secondary | ICD-10-CM

## 2015-05-06 DIAGNOSIS — R110 Nausea: Secondary | ICD-10-CM | POA: Diagnosis not present

## 2015-05-06 DIAGNOSIS — R112 Nausea with vomiting, unspecified: Secondary | ICD-10-CM | POA: Diagnosis not present

## 2015-05-06 MED ORDER — IOHEXOL 300 MG/ML  SOLN
100.0000 mL | Freq: Once | INTRAMUSCULAR | Status: AC | PRN
  Administered 2015-05-06: 100 mL via INTRAVENOUS

## 2015-05-06 NOTE — Telephone Encounter (Signed)
Did not see note until now; pt was seen 04/27/15.

## 2015-05-07 ENCOUNTER — Telehealth: Payer: Self-pay | Admitting: Internal Medicine

## 2015-05-07 LAB — RNP ANTIBODY: RIBONUCLEIC PROTEIN(ENA) ANTIBODY, IGG: NEGATIVE

## 2015-05-07 LAB — MPO/PR-3 (ANCA) ANTIBODIES
Myeloperoxidase Abs: <1
Serine Protease 3: <1

## 2015-05-07 LAB — CK TOTAL AND CKMB (NOT AT ARMC)
CK, MB: <0.7 ng/mL (ref 0.0–5.0)
Total CK: 27 U/L (ref 7–177)

## 2015-05-07 LAB — ANGIOTENSIN CONVERTING ENZYME: Angiotensin-Converting Enzyme: 60 U/L — ABNORMAL HIGH (ref 8–52)

## 2015-05-07 LAB — ANCA SCREEN W REFLEX TITER: ANCA Screen: POSITIVE — AB

## 2015-05-07 LAB — SJOGREN'S SYNDROME ANTIBODS(SSA + SSB)
SSA (RO) (ENA) ANTIBODY, IGG: NEGATIVE
SSB (La) (ENA) Antibody, IgG: <1

## 2015-05-07 LAB — ANTI-SCLERODERMA ANTIBODY: Scleroderma (Scl-70) (ENA) Antibody, IgG: <1

## 2015-05-07 LAB — RHEUMATOID FACTOR: Rhuematoid fact SerPl-aCnc: <10 IU/mL (ref <=14)

## 2015-05-07 LAB — C-ANCA TITER

## 2015-05-07 LAB — CYCLIC CITRUL PEPTIDE ANTIBODY, IGG: Cyclic Citrullin Peptide Ab: <16 Units

## 2015-05-07 LAB — SEDIMENTATION RATE: SED RATE: 27 mm/h — AB (ref 0–20)

## 2015-05-07 LAB — ANTI-DNA ANTIBODY, DOUBLE-STRANDED: DS DNA AB: 1 [IU]/mL

## 2015-05-07 NOTE — Telephone Encounter (Signed)
Results faxed.  Nothing further needed.

## 2015-05-07 NOTE — Telephone Encounter (Signed)
Please send Madison Holt 1980-04-09 - CT abdomen resuls to Dr Charna Elizabeth office  Ct Chest W Contrast  05/06/2015  CLINICAL DATA:  Interstitial lung disease. EXAM: CT CHEST, ABDOMEN, AND PELVIS WITH CONTRAST TECHNIQUE: Multidetector CT imaging of the chest, abdomen and pelvis was performed following the standard protocol during bolus administration of intravenous contrast. CONTRAST:  100 cc of Omnipaque 300 COMPARISON:  High-resolution chest CT of earlier today. No prior abdominal pelvic imaging. FINDINGS: CT CHEST CT CHEST FINDINGS Mediastinum/Nodes: No supraclavicular adenopathy. No axillary adenopathy. Normal heart size, without pericardial effusion. Thoracic adenopathy, as detailed on the prior exam. Example node within the azygoesophageal recess measures 1.8 cm on image 25/series 2. Right hilar adenopathy at 1.5 cm on image 20/series 2. Lungs/Pleura: No pleural fluid. Upper lobe predominant perilymphatic nodularity with architectural distortion again identified, and better evaluated on today's high-resolution study. Musculoskeletal: No acute osseous abnormality. CT ABDOMEN PELVIS FINDINGS Hepatobiliary: Mild hepatic steatosis, without focal liver lesion. Hepatomegaly, 20 cm craniocaudal. Variant lateral segment left liver lobe extending into the left upper quadrant. Normal gallbladder, without biliary ductal dilatation. Pancreas: Normal, without mass or ductal dilatation. Spleen: Normal in size, without focal abnormality. Adrenals/Urinary Tract: Normal adrenal glands. Normal kidneys, without hydronephrosis. Normal urinary bladder. Stomach/Bowel: Normal stomach, without wall thickening. Normal colon, appendix, and terminal ileum. Normal small bowel. Vascular/Lymphatic: Normal caliber of the aorta and branch vessels. Small retroperitoneal nodes. None are pathologic by size criteria. No pelvic sidewall adenopathy. Reproductive: Normal uterus and right adnexa. A left ovarian corpus luteal cyst measures 2.1 cm  on image 103/series 2. Other: Trace free pelvic fluid is likely physiologic. Musculoskeletal: No acute osseous abnormality. IMPRESSION: 1. 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 2. No evidence of subdiaphragmatic sarcoid. 3. Hepatomegaly and hepatic steatosis. 4. Left ovarian corpus luteal cyst. 5.  Trace free pelvic fluid is likely physiologic. Electronically Signed   By: Jeronimo Greaves M.D.   On: 05/06/2015 16:37   Ct Abdomen Pelvis W Contrast  05/06/2015  CLINICAL DATA:  Interstitial lung disease. EXAM: CT CHEST, ABDOMEN, AND PELVIS WITH CONTRAST TECHNIQUE: Multidetector CT imaging of the chest, abdomen and pelvis was performed following the standard protocol during bolus administration of intravenous contrast. CONTRAST:  100 cc of Omnipaque 300 COMPARISON:  High-resolution chest CT of earlier today. No prior abdominal pelvic imaging. FINDINGS: CT CHEST CT CHEST FINDINGS Mediastinum/Nodes: No supraclavicular adenopathy. No axillary adenopathy. Normal heart size, without pericardial effusion. Thoracic adenopathy, as detailed on the prior exam. Example node within the azygoesophageal recess measures 1.8 cm on image 25/series 2. Right hilar adenopathy at 1.5 cm on image 20/series 2. Lungs/Pleura: No pleural fluid. Upper lobe predominant perilymphatic nodularity with architectural distortion again identified, and better evaluated on today's high-resolution study. Musculoskeletal: No acute osseous abnormality. CT ABDOMEN PELVIS FINDINGS Hepatobiliary: Mild hepatic steatosis, without focal liver lesion. Hepatomegaly, 20 cm craniocaudal. Variant lateral segment left liver lobe extending into the left upper quadrant. Normal gallbladder, without biliary ductal dilatation. Pancreas: Normal, without mass or ductal dilatation. Spleen: Normal in size, without focal abnormality. Adrenals/Urinary Tract: Normal adrenal glands. Normal kidneys, without hydronephrosis. Normal urinary bladder. Stomach/Bowel: Normal  stomach, without wall thickening. Normal colon, appendix, and terminal ileum. Normal small bowel. Vascular/Lymphatic: Normal caliber of the aorta and branch vessels. Small retroperitoneal nodes. None are pathologic by size criteria. No pelvic sidewall adenopathy. Reproductive: Normal uterus and right adnexa. A left ovarian corpus luteal cyst measures 2.1 cm on image 103/series 2. Other: Trace free pelvic fluid is likely  physiologic. Musculoskeletal: No acute osseous abnormality. IMPRESSION: 1. 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 2. No evidence of subdiaphragmatic sarcoid. 3. Hepatomegaly and hepatic steatosis. 4. Left ovarian corpus luteal cyst. 5.  Trace free pelvic fluid is likely physiologic. Electronically Signed   By: Jeronimo Greaves M.D.   On: 05/06/2015 16:37   Ct Chest High Resolution  05/06/2015  CLINICAL DATA:  35 year old female with chronic shortness of breath since hospitalization for pneumonia in December 2014. Intermittent fevers, chills and nausea and vomiting approximately 1 week out of every month since October 2016. EXAM: CT CHEST WITHOUT CONTRAST TECHNIQUE: Multidetector CT imaging of the chest was performed following the standard protocol without intravenous contrast. High resolution imaging of the lungs, as well as inspiratory and expiratory imaging, was performed. COMPARISON:  No priors. FINDINGS: Mediastinum/Lymph Nodes: Heart size is normal. There is no significant pericardial fluid, thickening or pericardial calcification. Multiple borderline enlarged and mildly enlarged mediastinal and bilateral hilar lymph nodes measuring up to 1 cm in short axis in the subcarinal nodal station. Esophagus is unremarkable in appearance. No axillary lymphadenopathy. Lungs/Pleura: There is extensive micro and macronodularity throughout the lungs bilaterally. Nodules are distributed predominantly throughout the mid to upper lungs. The nodule distribution appears to be peribronchovascular  and subpleural (i.e., overall distribution is perilymphatic). There are several areas with conglomeration dense of nodules forming mass-like opacities, most evident in the posterior aspect of the right upper lobe abutting the major fissure. These findings are associated with some upward retraction of hilar structures. No pleural effusions. High-resolution images demonstrate no significant regions of ground-glass attenuation, parenchymal banding or frank honeycombing. Inspiratory and expiratory imaging demonstrates minimal air trapping. Upper abdomen: Unremarkable. Musculoskeletal: There are no aggressive appearing lytic or blastic lesions noted in the visualized portions of the skeleton. IMPRESSION: 1. Imaging findings on today's examination are strongly suggestive of sarcoidosis, as discussed above. Clinical correlation is suggested. 2. No finding to suggest interstitial lung disease. Electronically Signed   By: Trudie Reed M.D.   On: 05/06/2015 15:48

## 2015-05-11 ENCOUNTER — Ambulatory Visit (INDEPENDENT_AMBULATORY_CARE_PROVIDER_SITE_OTHER): Payer: BLUE CROSS/BLUE SHIELD | Admitting: Internal Medicine

## 2015-05-11 ENCOUNTER — Encounter: Payer: Self-pay | Admitting: Internal Medicine

## 2015-05-11 VITALS — BP 110/78 | HR 80 | Ht 64.0 in | Wt 146.8 lb

## 2015-05-11 DIAGNOSIS — J849 Interstitial pulmonary disease, unspecified: Secondary | ICD-10-CM | POA: Insufficient documentation

## 2015-05-11 DIAGNOSIS — D860 Sarcoidosis of lung: Secondary | ICD-10-CM | POA: Insufficient documentation

## 2015-05-11 DIAGNOSIS — R599 Enlarged lymph nodes, unspecified: Secondary | ICD-10-CM | POA: Diagnosis not present

## 2015-05-11 DIAGNOSIS — R590 Localized enlarged lymph nodes: Secondary | ICD-10-CM

## 2015-05-11 DIAGNOSIS — R918 Other nonspecific abnormal finding of lung field: Secondary | ICD-10-CM | POA: Diagnosis not present

## 2015-05-11 LAB — HYPERSENSITIVITY PNEUMONITIS
A. FUMIGATUS #1 ABS: NEGATIVE
A. Pullulans Abs: NEGATIVE
MICROPOLYSPORA FAENI IGG: NEGATIVE
Pigeon Serum Abs: NEGATIVE
THERMOACTINOMYCES VULGARIS IGG: NEGATIVE
Thermoact. Saccharii: NEGATIVE

## 2015-05-11 LAB — ALDOLASE: ALDOLASE: 3.3 U/L (ref 3.3–10.3)

## 2015-05-11 NOTE — Progress Notes (Signed)
Subjective:    Patient ID: Madison Holt, female    DOB: 26-May-1980, 35 y.o.   MRN: [id number] PCP Hannah Beat, MD Dr Loreta Ave  HPI  IOV 05/11/2015  Chief Complaint  Patient presents with  . Pulmonary Consult    Pt referred by Dr. Loreta Ave for SOB and chest tightness. Pt is here after CT chest, CT abdoment, and PFT. Pt c/o DOE, chest tightness with SOB chest tightness resolves with rest, dry cough x 5 months.     35 year old female referred by Dr. Loreta Ave. 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 She is exposed to the sprays. She reports that in end of 2014 she was admitted to [facility] after having pneumonia following bronchitis. Post discharge she was given a diagnosis of pneumonia again in February 2015. Ever since that first pneumonia and end 2014 she's had dyspnea on exertion. This is now progressively worse. Climbing stairs makes her dyspneic. When she feels hot she gets dyspneic. She also has associated chest tightness in the center of the chest that happens periodically. It does not appear that dust or pollen make her worse but temperature changes scan. Resting and then taking a deep breath helps her. Symptoms are rated as moderate to severe and they are definitely progressive. This no associated wheezing. The might be a mild cough. Then in October 2016 she started having B symptoms. These are characterized by fever chills and low appetite. These happen for 1 week each month since then. During this time she does not eat much. Couple of these episodes of been associated with nausea vomiting and some hematocrit Mrs. She is extreme short of breath and is worried. Advair does help but makes her tachycardic.   Full pulmonary function test 05/04/2015 is normal   Autoimmune workup 05/06/2015 extensive panel is essentially normal except angiotensin-converting enzyme is elevated at 60 and c-ANCA  is trace positive at 1:40 but PR 3 and MPO antibodies are  negative.  05/06/2015 she also had CT chest with contrast and high resolution CT chest without contrast: There classic pulmonary infiltrates and hilar and subcarinal lymphadenopathy. Overall pattern is consistent with sarcoidosis radiologically according to the radiologist. I personally visualized this film.   Walking desat test 185 feet  X 3 laps on RA -> did not desatrurate    has a past medical history of Pneumonia (2014).   reports that she quit smoking about 17 years ago. Her smoking use included Cigarettes. She has a 5 pack-year smoking history. She has never used smokeless tobacco.  No past surgical history on file.  Allergies  Allergen Reactions  . Codeine     Other reaction(s): Hallucination     There is no immunization history on file for this patient.  Family History  Problem Relation Age of Onset  . Arthritis Mother   . Hyperlipidemia Mother   . Hyperlipidemia Father   . Diabetes Father   . Breast cancer Maternal Grandmother   . Hypertension Maternal Grandmother   . Colon cancer Maternal Grandfather   . Hypertension Maternal Grandfather   . Heart disease Paternal Grandfather      Current outpatient prescriptions:  .  acetaminophen (TYLENOL) 325 MG tablet, Take 650 mg by mouth every 6 (six) hours as needed., Disp: , Rfl:  .  Fluticasone-Salmeterol (ADVAIR) 100-50 MCG/DOSE AEPB, Inhale 1 puff into the lungs 2 (two) times daily., Disp: 60 each, Rfl: 5     Review of Systems  Constitutional: Negative for fever and unexpected weight change.  HENT: Negative for congestion, dental problem, ear pain, nosebleeds, postnasal drip, rhinorrhea, sinus pressure, sneezing, sore throat and trouble swallowing.   Eyes: Negative for redness and itching.  Respiratory: Positive for cough, chest tightness and shortness of breath. Negative for wheezing.   Cardiovascular: Negative for palpitations and leg swelling.  Gastrointestinal: Negative for nausea and vomiting.   Genitourinary: Negative for dysuria.  Musculoskeletal: Negative for joint swelling.  Skin: Negative for rash.  Neurological: Negative for headaches.  Hematological: Does not bruise/bleed easily.  Psychiatric/Behavioral: Negative for dysphoric mood. The patient is not nervous/anxious.        Objective:   Physical Exam  Constitutional: She is oriented to person, place, and time. She appears well-developed and well-nourished. No distress.  HENT:  Head: Normocephalic and atraumatic.  Right Ear: External ear normal.  Left Ear: External ear normal.  Mouth/Throat: Oropharynx is clear and moist. No oropharyngeal exudate.  Eyes: Conjunctivae and EOM are normal. Pupils are equal, round, and reactive to light. Right eye exhibits no discharge. Left eye exhibits no discharge. No scleral icterus.  Neck: Normal range of motion. Neck supple. No JVD present. No tracheal deviation present. No thyromegaly present.  Cardiovascular: Normal rate, regular rhythm, normal heart sounds and intact distal pulses.  Exam reveals no gallop and no friction rub.   No murmur heard. Pulmonary/Chest: Effort normal and breath sounds normal. No respiratory distress. She has no wheezes. She has no rales. She exhibits no tenderness.  Abdominal: Soft. Bowel sounds are normal. She exhibits no distension and no mass. There is no tenderness. There is no rebound and no guarding.  Musculoskeletal: Normal range of motion. She exhibits no edema or tenderness.  Lymphadenopathy:    She has no cervical adenopathy.  Neurological: She is alert and oriented to person, place, and time. She has normal reflexes. No cranial nerve deficit. She exhibits normal muscle tone. Coordination normal.  Skin: Skin is warm and dry. No rash noted. She is not diaphoretic. No erythema. No pallor.  Psychiatric: She has a normal mood and affect. Her behavior is normal. Judgment and thought content normal.  Was in a bit of tears at end of interview - due to  new medical diagnosis and feeling overwhelmed  Vitals reviewed.   Filed Vitals:   05/11/15 0907  BP: 110/78  Pulse: 80  Height: 5\' 4"  (1.626 m)  Weight: 146 lb 12.8 oz (66.588 kg)  SpO2: 98%         Assessment & Plan:     ICD-9-CM ICD-10-CM   1. Pulmonary infiltrate present on computed tomography 793.19 R91.8   2. 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 I think the trace positive c-CNCA is false positive because MPO and PR 3 specific antibodies are negative. Therefore do not think she has vasculitis. I've explained this to her.  She probably will benefit best from and a bronchial ultrasound guided biopsy of the mediastinal nodes including subcarinal node and transbronchial biopsy of the pulmonary infiltrates associated with bronchoalveolar lavage for cell count and differential and CD4: CD8 ratios. Despite all of the above there is a 20% probability that we will have nondiagnostic results. Therefore it might be best that procedures attempted by thoracic surgery Dr. Dorris FetchHendrickson who can then converted to a mediastinoscopy if needed in the same setting.  I have discussed this with Dr. Dorris FetchHendrickson and he has agreed to see the patient.  This has been communicated with the patient the following day on 05/12/2015   I will see her for follow-up visit someone week after her procedure    Dr. [name], M.D., [facility].C.P Pulmonary and Critical Care Medicine Staff Physician  System [address] Pulmonary and Critical Care Pager: [phone], If no answer or between  15:00h - 7:00h: call [phone]  05/12/2015 6:22 PM

## 2015-05-11 NOTE — Patient Instructions (Signed)
ICD-9-CM ICD-10-CM   1. Pulmonary infiltrate present on computed tomography 793.19 R91.8   2. 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

## 2015-05-12 ENCOUNTER — Telehealth: Payer: Self-pay | Admitting: Internal Medicine

## 2015-05-12 DIAGNOSIS — R918 Other nonspecific abnormal finding of lung field: Secondary | ICD-10-CM

## 2015-05-12 NOTE — Telephone Encounter (Signed)
Pt is calling checking the status of bronch being scheduled.  Please advise MR thanks

## 2015-05-12 NOTE — Telephone Encounter (Signed)
Referral to dr hendrickson placed.  Nothing further needed.

## 2015-05-12 NOTE — Telephone Encounter (Signed)
i just heard from Dr Dorris FetchHendrickson of [facility] . He has agreed to do procedure of bronch. ANd if non0diagnostic he will be able to do mediastonasccopy same settting. Please refer to DR Dorris FetchHendrickson and give her fu appt to see me some 1 week after his procedure date

## 2015-05-18 ENCOUNTER — Other Ambulatory Visit: Payer: Self-pay | Admitting: *Deleted

## 2015-05-18 ENCOUNTER — Telehealth: Payer: Self-pay | Admitting: Internal Medicine

## 2015-05-18 ENCOUNTER — Institutional Professional Consult (permissible substitution) (INDEPENDENT_AMBULATORY_CARE_PROVIDER_SITE_OTHER): Payer: BLUE CROSS/BLUE SHIELD | Admitting: Thoracic Surgery (Cardiothoracic Vascular Surgery)

## 2015-05-18 VITALS — BP 116/70 | HR 72 | Resp 16 | Ht 63.5 in | Wt 145.0 lb

## 2015-05-18 DIAGNOSIS — R918 Other nonspecific abnormal finding of lung field: Secondary | ICD-10-CM | POA: Diagnosis not present

## 2015-05-18 DIAGNOSIS — R599 Enlarged lymph nodes, unspecified: Secondary | ICD-10-CM | POA: Diagnosis not present

## 2015-05-18 DIAGNOSIS — R590 Localized enlarged lymph nodes: Secondary | ICD-10-CM

## 2015-05-18 NOTE — Telephone Encounter (Signed)
Stacey with CT calling to speak with Robynn PaneElise specifically about getting a Super D made of this patient's scan.  States that she would like to leave this for Robynn Panelise for 05/19/15 to call her back.  [facility].

## 2015-05-18 NOTE — Progress Notes (Signed)
PCP is Spencer Copland, MD Referring Provider is Ramaswamy, Murali, MD  Chief Complaint  Patient presents with  . Shortness of Breath    on exertion after bronchitis/pneumonia----infiltrates.....eval for BRONCH...PFT 05/04/15, CT CHEST 05/06/15    HPI: A 34-year-old woman with a chief complaint of persistent shortness of breath.  Mrs. Eastwood is a 34-year-old woman. She is a former smoker having quit in 1999. 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 She first became ill in the fall of 2014. She was complaining of cough and shortness of breath. She was diagnosed with pneumonia. She was treated with antibiotics and was in the hospital for about a week. She improved but said her symptoms never completely went away. She was treated again for pneumonia in January 2015. She continued to have intermittent issues with cough and shortness of breath with exertion. In October 2016 she had worsening shortness of breath along with fevers and chills. She was treated with antibiotics. Over the past 4 months she's had about 1 week each month where she has fevers and chills and general malaise. She is persistently short of breath with walking or talking and can no longer run.  She has not had any change in weight over the past 3 months. She has no known exposures.  She saw Dr. Ramaswamy. PFTs were unremarkable. An autoimmune workup showed angiotensin-converting enzyme elevated at 60 and c-ANCA is trace positive at 1:40 but PR 3 and MPO antibodies are negative.  A CT of the chest abdomen and pelvis showed findings suspicious for sarcoidosis.  Past Medical History  Diagnosis Date  . Pneumonia 2014    Hosp x 1 week    No past surgical history on file.  Family History  Problem Relation Age of Onset  . Arthritis Mother   . Hyperlipidemia Mother   . Hyperlipidemia Father   . Diabetes Father   . Breast cancer Maternal Grandmother   . Hypertension Maternal Grandmother   . Colon cancer Maternal Grandfather    . Hypertension Maternal Grandfather   . Heart disease Paternal Grandfather     Social History Social History  Substance Use Topics  . Smoking status: Former Smoker -- 1.00 packs/day for 5 years    Types: Cigarettes    Quit date: 03/07/1998  . Smokeless tobacco: Never Used  . Alcohol Use: 1.2 oz/week    2 Standard drinks or equivalent per week     Comment: soical    Current Outpatient Prescriptions  Medication Sig Dispense Refill  . acetaminophen (TYLENOL) 325 MG tablet Take 650 mg by mouth every 6 (six) hours as needed.    . Fluticasone-Salmeterol (ADVAIR) 100-50 MCG/DOSE AEPB Inhale 1 puff into the lungs 2 (two) times daily. 60 each 5   No current facility-administered medications for this visit.    Allergies  Allergen Reactions  . Codeine     Other reaction(s): Hallucination    Review of Systems  Constitutional: Positive for fever, chills and activity change. Negative for appetite change and unexpected weight change.  Eyes: Positive for visual disturbance (blurry).  Respiratory: Positive for cough, chest tightness and shortness of breath.   Cardiovascular: Positive for chest pain. Negative for leg swelling.  Gastrointestinal: Negative for abdominal pain and blood in stool.  Genitourinary: Negative for dysuria and hematuria.  Musculoskeletal: Negative for joint swelling and arthralgias.  Neurological: Positive for dizziness and light-headedness. Negative for syncope.  Hematological: Negative for adenopathy. Does not bruise/bleed easily.    BP 116/70 mmHg  Pulse   72  Resp 16  Ht 5' 3.5" (1.613 m)  Wt 145 lb (65.772 kg)  BMI 25.28 kg/m2  SpO2 99%  LMP 04/19/2015 Physical Exam  Constitutional: She is oriented to person, place, and time. She appears well-developed and well-nourished. No distress.  HENT:  Head: Normocephalic and atraumatic.  Eyes: Conjunctivae and EOM are normal. Pupils are equal, round, and reactive to light. No scleral icterus.  Neck: Neck  supple. No thyromegaly present.  Cardiovascular: Normal rate, regular rhythm, normal heart sounds and intact distal pulses.  Exam reveals no gallop and no friction rub.   No murmur heard. Pulmonary/Chest: Effort normal and breath sounds normal. No respiratory distress. She has no wheezes. She has no rales.  Abdominal: Soft. She exhibits no distension. There is no tenderness.  Musculoskeletal: She exhibits no edema.  Lymphadenopathy:    She has no cervical adenopathy.  Neurological: She is alert and oriented to person, place, and time. No cranial nerve deficit.  Skin: Skin is warm and dry.  Vitals reviewed.    Diagnostic Tests: CT CHEST, ABDOMEN, AND PELVIS WITH CONTRAST  TECHNIQUE: Multidetector CT imaging of the chest, abdomen and pelvis was performed following the standard protocol during bolus administration of intravenous contrast.  CONTRAST: 100 cc of Omnipaque 300  COMPARISON: High-resolution chest CT of earlier today. No prior abdominal pelvic imaging.  FINDINGS: CT CHEST  CT CHEST FINDINGS  Mediastinum/Nodes: No supraclavicular adenopathy. No axillary adenopathy. Normal heart size, without pericardial effusion. Thoracic adenopathy, as detailed on the prior exam. Example node within the azygoesophageal recess measures 1.8 cm on image 25/series 2.  Right hilar adenopathy at 1.5 cm on image 20/series 2.  Lungs/Pleura: No pleural fluid. Upper lobe predominant perilymphatic nodularity with architectural distortion again identified, and better evaluated on today's high-resolution study.  Musculoskeletal: No acute osseous abnormality.  CT ABDOMEN PELVIS FINDINGS  Hepatobiliary: Mild hepatic steatosis, without focal liver lesion. Hepatomegaly, 20 cm craniocaudal. Variant lateral segment left liver lobe extending into the left upper quadrant. Normal gallbladder, without biliary ductal dilatation.  Pancreas: Normal, without mass or ductal  dilatation.  Spleen: Normal in size, without focal abnormality.  Adrenals/Urinary Tract: Normal adrenal glands. Normal kidneys, without hydronephrosis. Normal urinary bladder.  Stomach/Bowel: Normal stomach, without wall thickening. Normal colon, appendix, and terminal ileum. Normal small bowel.  Vascular/Lymphatic: Normal caliber of the aorta and branch vessels. Small retroperitoneal nodes. None are pathologic by size criteria. No pelvic sidewall adenopathy.  Reproductive: Normal uterus and right adnexa. A left ovarian corpus luteal cyst measures 2.1 cm on image 103/series 2.  Other: Trace free pelvic fluid is likely physiologic.  Musculoskeletal: No acute osseous abnormality.  IMPRESSION: 1. 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 2. No evidence of subdiaphragmatic sarcoid. 3. Hepatomegaly and hepatic steatosis. 4. Left ovarian corpus luteal cyst. 5. Trace free pelvic fluid is likely physiologic.   Electronically Signed  By: Kyle Talbot M.D.  On: 05/06/2015 16:37  I personally reviewed the CT chest and concur with findings noted above  Impression: Mrs. Gorczyca is a 34-year-old woman with progressive dyspnea and persistent cough who has pulmonary infiltrates and mediastinal and hilar adenopathy. These findings are highly suspicious for sarcoidosis, but she does not yet have a diagnosis. AFB or fungal diseases and lymphoma are also in the differential.  She needs a biopsy to confirm the diagnosis.  I recommended to her that we proceed with bronchoscopy and endobronchial ultrasound to sample the pulmonary infiltrates (biopsy and bronchoalveolar lavage) and mediastinal and hilar lymph nodes.   If those are negative we would proceed with mediastinoscopy at the same setting to obtain better samples of the mediastinal lymph nodes. I reviewed the general nature of the procedure with her. She understands this will be done in the operating room under  general anesthesia. We will plan to do this on an outpatient basis. She understands the intraoperative decision making regarding whether to proceed with mediastinoscopy. I reviewed the indications, risks, benefits, and alternatives. She understands the risks include, but are not limited to bleeding, pneumothorax, infection, recurrent nerve injury leading to hoarseness, esophageal injury, as well as the possibility of other unforeseeable complications.  She accepts the risks and wishes to proceed  Plan: Bronchoscopy, endobronchial ultrasound, possible mediastinoscopy on Friday, 06/05/2015.  Steven C Hendrickson, MD [facility] [phone]  

## 2015-05-20 NOTE — Telephone Encounter (Signed)
Called and spoke to ElkinLisa at CT. Misty StanleyLisa is unsure why Misty StanleyStacey called and will have her return my call on 3.16.17. Will await call.

## 2015-05-22 NOTE — Telephone Encounter (Signed)
Called and spoke with Stacy. She states that the issue has been resolved and nothing further is needed. She states that the call should have been sent to Dr. Robby SermonHendricks office instead of ours.

## 2015-05-25 ENCOUNTER — Ambulatory Visit: Payer: BLUE CROSS/BLUE SHIELD | Admitting: Family Medicine

## 2015-05-27 ENCOUNTER — Inpatient Hospital Stay (HOSPITAL_COMMUNITY)
Admission: RE | Admit: 2015-05-27 | Discharge: 2015-05-27 | Disposition: A | Discharge status: Home / Self Care | Payer: BLUE CROSS/BLUE SHIELD | Source: Ambulatory Visit

## 2015-05-27 ENCOUNTER — Other Ambulatory Visit (HOSPITAL_COMMUNITY): Payer: Self-pay | Admitting: *Deleted

## 2015-05-27 NOTE — Pre-Procedure Instructions (Signed)
    Madison BushmanJennifer L Meditz  05/27/2015      [id number] [name] - [address] [name] [id number] Phone: [phone] Fax: [phone]    Your procedure is scheduled on 06-05-2015  Friday    [facility].M.   Call this number if you have problems the morning of surgery:  [phone]   Remember:  Do not eat food or drink liquids after midnight.   Take these medicines the morning of surgery with A SIP OF WATER Tylenol if needed,Advair inhaler,eye drops as needed for dry eyes   Do not wear jewelry, make-up or nail polish.   Do not wear lotions, powders, or perfumes.  You may not wear deodorant.  Do not shave 48 hours prior to surgery.     Do not bring valuables to the hospital.  [facility] is not responsible for any belongings or valuables.  Contacts, dentures or bridgework may not be worn into surgery.  Leave your suitcase in the car.  After surgery it may be brought to your room.  For patients admitted to the hospital, discharge time will be determined by your treatment team.  Patients discharged the day of surgery will not be allowed to drive home.    Special instructions: See attached Sheet for instructions on CHG showers  Please read over the following fact sheets that you were given. Pain Booklet, Coughing and Deep Breathing and Surgical Site Infection Prevention

## 2015-06-01 ENCOUNTER — Encounter (HOSPITAL_COMMUNITY): Payer: Self-pay

## 2015-06-01 ENCOUNTER — Encounter (HOSPITAL_COMMUNITY)
Admission: RE | Admit: 2015-06-01 | Discharge: 2015-06-01 | Disposition: A | Discharge status: Home / Self Care | Payer: BLUE CROSS/BLUE SHIELD | Source: Ambulatory Visit | Attending: Thoracic Surgery (Cardiothoracic Vascular Surgery) | Admitting: Thoracic Surgery (Cardiothoracic Vascular Surgery)

## 2015-06-01 VITALS — BP 130/85 | HR 100 | Temp 98.8°F | Resp 18 | Ht 64.0 in | Wt 146.0 lb

## 2015-06-01 DIAGNOSIS — Z0183 Encounter for blood typing: Secondary | ICD-10-CM | POA: Insufficient documentation

## 2015-06-01 DIAGNOSIS — Z01818 Encounter for other preprocedural examination: Secondary | ICD-10-CM | POA: Diagnosis present

## 2015-06-01 DIAGNOSIS — Z01812 Encounter for preprocedural laboratory examination: Secondary | ICD-10-CM | POA: Insufficient documentation

## 2015-06-01 DIAGNOSIS — R590 Localized enlarged lymph nodes: Secondary | ICD-10-CM

## 2015-06-01 DIAGNOSIS — R918 Other nonspecific abnormal finding of lung field: Secondary | ICD-10-CM

## 2015-06-01 HISTORY — DX: Reserved for inherently not codable concepts without codable children: IMO0001

## 2015-06-01 LAB — COMPREHENSIVE METABOLIC PANEL
ALK PHOS: 51 U/L (ref 38–126)
ALT: 20 U/L (ref 14–54)
AST: 22 U/L (ref 15–41)
Albumin: 3.8 g/dL (ref 3.5–5.0)
Anion gap: 7 (ref 5–15)
BUN: 9 mg/dL (ref 6–20)
CALCIUM: 9.2 mg/dL (ref 8.9–10.3)
CO2: 26 mmol/L (ref 22–32)
CREATININE: 1.02 mg/dL — AB (ref 0.44–1.00)
Chloride: 108 mmol/L (ref 101–111)
Glucose, Bld: 90 mg/dL (ref 65–99)
Potassium: 3.9 mmol/L (ref 3.5–5.1)
SODIUM: 141 mmol/L (ref 135–145)
Total Bilirubin: 0.7 mg/dL (ref 0.3–1.2)
Total Protein: 7 g/dL (ref 6.5–8.1)

## 2015-06-01 LAB — TYPE AND SCREEN
ABO/RH(D): O POS
ANTIBODY SCREEN: NEGATIVE

## 2015-06-01 LAB — PROTIME-INR
INR: 1.07 (ref 0.00–1.49)
PROTHROMBIN TIME: 14.1 s (ref 11.6–15.2)

## 2015-06-01 LAB — CBC
HCT: 42.1 % (ref 36.0–46.0)
HEMOGLOBIN: 14 g/dL (ref 12.0–15.0)
MCH: 29.2 pg (ref 26.0–34.0)
MCHC: 33.3 g/dL (ref 30.0–36.0)
MCV: 87.7 fL (ref 78.0–100.0)
Platelets: 228 10*3/uL (ref 150–400)
RBC: 4.8 MIL/uL (ref 3.87–5.11)
RDW: 13.5 % (ref 11.5–15.5)
WBC: 5.4 10*3/uL (ref 4.0–10.5)

## 2015-06-01 LAB — SURGICAL PCR SCREEN
MRSA, PCR: NEGATIVE
Staphylococcus aureus: NEGATIVE

## 2015-06-01 LAB — HCG, SERUM, QUALITATIVE: Preg, Serum: NEGATIVE

## 2015-06-01 LAB — APTT: aPTT: 26 seconds (ref 24–37)

## 2015-06-01 LAB — ABO/RH: ABO/RH(D): O POS

## 2015-06-01 NOTE — Progress Notes (Signed)
PCP is Dr. Karleen HampshireSpencer Copland Denies ever seeing a cardiologist. Denies ever having a stress test, echo, or card cath.

## 2015-06-05 ENCOUNTER — Ambulatory Visit (HOSPITAL_COMMUNITY): Payer: BLUE CROSS/BLUE SHIELD | Admitting: Certified Registered Nurse Anesthetist

## 2015-06-05 ENCOUNTER — Encounter (HOSPITAL_COMMUNITY): Payer: Self-pay | Admitting: *Deleted

## 2015-06-05 ENCOUNTER — Ambulatory Visit (HOSPITAL_COMMUNITY)
Admission: RE | Admit: 2015-06-05 | Discharge: 2015-06-05 | Disposition: A | Discharge status: Home / Self Care | Payer: BLUE CROSS/BLUE SHIELD | Source: Ambulatory Visit | Attending: Thoracic Surgery (Cardiothoracic Vascular Surgery) | Admitting: Thoracic Surgery (Cardiothoracic Vascular Surgery)

## 2015-06-05 ENCOUNTER — Ambulatory Visit (HOSPITAL_COMMUNITY): Payer: BLUE CROSS/BLUE SHIELD

## 2015-06-05 ENCOUNTER — Encounter (HOSPITAL_COMMUNITY)
Admission: RE | Disposition: A | Discharge status: Home / Self Care | Payer: Self-pay | Source: Ambulatory Visit | Attending: Thoracic Surgery (Cardiothoracic Vascular Surgery)

## 2015-06-05 DIAGNOSIS — Z87891 Personal history of nicotine dependence: Secondary | ICD-10-CM | POA: Insufficient documentation

## 2015-06-05 DIAGNOSIS — Z7951 Long term (current) use of inhaled steroids: Secondary | ICD-10-CM | POA: Diagnosis not present

## 2015-06-05 DIAGNOSIS — R590 Localized enlarged lymph nodes: Secondary | ICD-10-CM | POA: Diagnosis not present

## 2015-06-05 DIAGNOSIS — Z419 Encounter for procedure for purposes other than remedying health state, unspecified: Secondary | ICD-10-CM

## 2015-06-05 DIAGNOSIS — I898 Other specified noninfective disorders of lymphatic vessels and lymph nodes: Secondary | ICD-10-CM | POA: Insufficient documentation

## 2015-06-05 DIAGNOSIS — R918 Other nonspecific abnormal finding of lung field: Secondary | ICD-10-CM | POA: Diagnosis not present

## 2015-06-05 HISTORY — PX: VIDEO BRONCHOSCOPY WITH ENDOBRONCHIAL NAVIGATION: SHX6175

## 2015-06-05 HISTORY — PX: VIDEO BRONCHOSCOPY WITH ENDOBRONCHIAL ULTRASOUND: SHX6177

## 2015-06-05 HISTORY — PX: MEDIASTINOSCOPY: SHX5086

## 2015-06-05 SURGERY — VIDEO BRONCHOSCOPY WITH ENDOBRONCHIAL NAVIGATION
Anesthesia: General | Site: Neck

## 2015-06-05 MED ORDER — DEXAMETHASONE SODIUM PHOSPHATE 10 MG/ML IJ SOLN
INTRAMUSCULAR | Status: AC
  Filled 2015-06-05: qty 1

## 2015-06-05 MED ORDER — NEOSTIGMINE METHYLSULFATE 10 MG/10ML IV SOLN
INTRAVENOUS | Status: DC | PRN
  Administered 2015-06-05: 3 mg via INTRAVENOUS

## 2015-06-05 MED ORDER — HYDROMORPHONE HCL 2 MG PO TABS: 2.0000 mg | ORAL_TABLET | Freq: Four times a day (QID) | ORAL | Status: DC | PRN

## 2015-06-05 MED ORDER — PROPOFOL 10 MG/ML IV BOLUS
INTRAVENOUS | Status: AC
  Filled 2015-06-05: qty 20

## 2015-06-05 MED ORDER — HYDROMORPHONE HCL 1 MG/ML IJ SOLN
0.2500 mg | INTRAMUSCULAR | Status: DC | PRN
  Administered 2015-06-05: 0.25 mg via INTRAVENOUS

## 2015-06-05 MED ORDER — SCOPOLAMINE 1 MG/3DAYS TD PT72
MEDICATED_PATCH | TRANSDERMAL | Status: AC
  Administered 2015-06-05: 1 via TRANSDERMAL
  Filled 2015-06-05: qty 1

## 2015-06-05 MED ORDER — ROCURONIUM BROMIDE 100 MG/10ML IV SOLN
INTRAVENOUS | Status: DC | PRN
  Administered 2015-06-05: 20 mg via INTRAVENOUS
  Administered 2015-06-05: 10 mg via INTRAVENOUS
  Administered 2015-06-05: 50 mg via INTRAVENOUS

## 2015-06-05 MED ORDER — DEXTROSE 5 % IV SOLN
1.5000 g | INTRAVENOUS | Status: AC
  Administered 2015-06-05: 1.5 g via INTRAVENOUS
  Filled 2015-06-05: qty 1.5

## 2015-06-05 MED ORDER — HYDROMORPHONE HCL 1 MG/ML IJ SOLN
INTRAMUSCULAR | Status: AC
  Administered 2015-06-05: 0.25 mg via INTRAVENOUS
  Filled 2015-06-05: qty 1

## 2015-06-05 MED ORDER — FENTANYL CITRATE (PF) 250 MCG/5ML IJ SOLN
INTRAMUSCULAR | Status: AC
  Filled 2015-06-05: qty 5

## 2015-06-05 MED ORDER — FENTANYL CITRATE (PF) 100 MCG/2ML IJ SOLN
INTRAMUSCULAR | Status: DC | PRN
  Administered 2015-06-05 (×6): 50 ug via INTRAVENOUS
  Administered 2015-06-05: 100 ug via INTRAVENOUS

## 2015-06-05 MED ORDER — LIDOCAINE HCL (CARDIAC) 20 MG/ML IV SOLN
INTRAVENOUS | Status: DC | PRN
  Administered 2015-06-05: 100 mg via INTRAVENOUS

## 2015-06-05 MED ORDER — CHLORHEXIDINE GLUCONATE 4 % EX LIQD: 1.0000 "application " | Freq: Once | CUTANEOUS | Status: DC

## 2015-06-05 MED ORDER — ONDANSETRON HCL 4 MG/2ML IJ SOLN
INTRAMUSCULAR | Status: DC | PRN
  Administered 2015-06-05: 4 mg via INTRAVENOUS

## 2015-06-05 MED ORDER — LACTATED RINGERS IV SOLN
INTRAVENOUS | Status: DC
  Administered 2015-06-05: 11:00:00 via INTRAVENOUS

## 2015-06-05 MED ORDER — ONDANSETRON HCL 4 MG/2ML IJ SOLN
INTRAMUSCULAR | Status: AC
  Filled 2015-06-05: qty 2

## 2015-06-05 MED ORDER — LACTATED RINGERS IV SOLN
INTRAVENOUS | Status: DC | PRN
  Administered 2015-06-05 (×2): via INTRAVENOUS

## 2015-06-05 MED ORDER — PROPOFOL 10 MG/ML IV BOLUS
INTRAVENOUS | Status: DC | PRN
  Administered 2015-06-05: 200 mg via INTRAVENOUS

## 2015-06-05 MED ORDER — MIDAZOLAM HCL 2 MG/2ML IJ SOLN
INTRAMUSCULAR | Status: AC
  Filled 2015-06-05: qty 2

## 2015-06-05 MED ORDER — DEXAMETHASONE SODIUM PHOSPHATE 10 MG/ML IJ SOLN
INTRAMUSCULAR | Status: DC | PRN
  Administered 2015-06-05: 10 mg via INTRAVENOUS

## 2015-06-05 MED ORDER — GLYCOPYRROLATE 0.2 MG/ML IJ SOLN
INTRAMUSCULAR | Status: DC | PRN
  Administered 2015-06-05: 0.4 mg via INTRAVENOUS

## 2015-06-05 MED ORDER — EPINEPHRINE HCL 1 MG/ML IJ SOLN
INTRAMUSCULAR | Status: AC
  Filled 2015-06-05: qty 1

## 2015-06-05 MED ORDER — 0.9 % SODIUM CHLORIDE (POUR BTL) OPTIME
TOPICAL | Status: DC | PRN
  Administered 2015-06-05: 1000 mL

## 2015-06-05 MED ORDER — ONDANSETRON HCL 4 MG/2ML IJ SOLN
4.0000 mg | Freq: Once | INTRAMUSCULAR | Status: AC | PRN
  Administered 2015-06-05: 4 mg via INTRAVENOUS

## 2015-06-05 MED ORDER — MIDAZOLAM HCL 5 MG/5ML IJ SOLN
INTRAMUSCULAR | Status: DC | PRN
  Administered 2015-06-05: 2 mg via INTRAVENOUS

## 2015-06-05 MED ORDER — PHENYLEPHRINE HCL 10 MG/ML IJ SOLN
INTRAMUSCULAR | Status: DC | PRN
  Administered 2015-06-05: 80 ug via INTRAVENOUS
  Administered 2015-06-05 (×2): 40 ug via INTRAVENOUS

## 2015-06-05 SURGICAL SUPPLY — 76 items
ADAPTER BRONCH F/PENTAX (ADAPTER) ×3 IMPLANT
APPLIER CLIP LOGIC TI 5 (MISCELLANEOUS) IMPLANT
BLADE SURG 15 STRL LF DISP TIS (BLADE) ×2 IMPLANT
BLADE SURG 15 STRL SS (BLADE) ×1
BRUSH CYTOL CELLEBRITY 1.5X140 (MISCELLANEOUS) IMPLANT
BRUSH SUPERTRAX BIOPSY (INSTRUMENTS) IMPLANT
BRUSH SUPERTRAX NDL-TIP CYTO (INSTRUMENTS) ×3 IMPLANT
CANISTER SUCTION 2500CC (MISCELLANEOUS) ×6 IMPLANT
CHANNEL WORK EXTEND EDGE 180 (KITS) IMPLANT
CHANNEL WORK EXTEND EDGE 45 (KITS) IMPLANT
CHANNEL WORK EXTEND EDGE 90 (KITS) IMPLANT
CLIP TI MEDIUM 6 (CLIP) IMPLANT
CONT SPEC 4OZ CLIKSEAL STRL BL (MISCELLANEOUS) ×9 IMPLANT
COTTONBALL LRG STERILE PKG (GAUZE/BANDAGES/DRESSINGS) IMPLANT
COVER DOME SNAP 22 D (MISCELLANEOUS) ×3 IMPLANT
COVER SURGICAL LIGHT HANDLE (MISCELLANEOUS) ×3 IMPLANT
COVER TABLE BACK 60X90 (DRAPES) ×3 IMPLANT
DERMABOND ADVANCED (GAUZE/BANDAGES/DRESSINGS) ×1
DERMABOND ADVANCED .7 DNX12 (GAUZE/BANDAGES/DRESSINGS) ×2 IMPLANT
DRAPE CHEST BREAST 15X10 FENES (DRAPES) IMPLANT
ELECT REM PT RETURN 9FT ADLT (ELECTROSURGICAL)
ELECTRODE REM PT RTRN 9FT ADLT (ELECTROSURGICAL) IMPLANT
FILTER STRAW FLUID ASPIR (MISCELLANEOUS) IMPLANT
FORCEPS BIOP RJ4 1.8 (CUTTING FORCEPS) IMPLANT
FORCEPS BIOP SUPERTRX PREMAR (INSTRUMENTS) ×3 IMPLANT
GAUZE SPONGE 4X4 12PLY STRL (GAUZE/BANDAGES/DRESSINGS) ×6 IMPLANT
GAUZE SPONGE 4X4 16PLY XRAY LF (GAUZE/BANDAGES/DRESSINGS) IMPLANT
GLOVE SURG SIGNA 7.5 PF LTX (GLOVE) ×3 IMPLANT
GOWN STRL REUS W/ TWL LRG LVL3 (GOWN DISPOSABLE) IMPLANT
GOWN STRL REUS W/ TWL XL LVL3 (GOWN DISPOSABLE) ×6 IMPLANT
GOWN STRL REUS W/TWL LRG LVL3 (GOWN DISPOSABLE)
GOWN STRL REUS W/TWL XL LVL3 (GOWN DISPOSABLE) ×3
HEMOSTAT SURGICEL 2X14 (HEMOSTASIS) IMPLANT
KIT BASIN OR (CUSTOM PROCEDURE TRAY) IMPLANT
KIT CLEAN ENDO COMPLIANCE (KITS) ×9 IMPLANT
KIT PROCEDURE EDGE 180 (KITS) IMPLANT
KIT PROCEDURE EDGE 45 (KITS) IMPLANT
KIT PROCEDURE EDGE 90 (KITS) ×3 IMPLANT
KIT ROOM TURNOVER OR (KITS) ×3 IMPLANT
MARKER SKIN DUAL TIP RULER LAB (MISCELLANEOUS) ×3 IMPLANT
NEEDLE 22X1 1/2 (OR ONLY) (NEEDLE) IMPLANT
NEEDLE BIOPSY TRANSBRONCH 21G (NEEDLE) IMPLANT
NEEDLE BLUNT 18X1 FOR OR ONLY (NEEDLE) IMPLANT
NEEDLE SONO TIP II EBUS (NEEDLE) IMPLANT
NEEDLE SUPERTRX PREMARK BIOPSY (NEEDLE) ×3 IMPLANT
NS IRRIG 1000ML POUR BTL (IV SOLUTION) ×3 IMPLANT
OIL SILICONE PENTAX (PARTS (SERVICE/REPAIRS)) ×3 IMPLANT
PACK SURGICAL SETUP 50X90 (CUSTOM PROCEDURE TRAY) IMPLANT
PAD ARMBOARD 7.5X6 YLW CONV (MISCELLANEOUS) ×9 IMPLANT
PATCHES PATIENT (LABEL) ×9 IMPLANT
PENCIL BUTTON HOLSTER BLD 10FT (ELECTRODE) IMPLANT
SPONGE INTESTINAL PEANUT (DISPOSABLE) IMPLANT
SUT SILK 2 0 (SUTURE)
SUT SILK 2-0 18XBRD TIE 12 (SUTURE) IMPLANT
SUT VIC AB 2-0 CT1 27 (SUTURE)
SUT VIC AB 2-0 CT1 TAPERPNT 27 (SUTURE) IMPLANT
SUT VIC AB 3-0 SH 18 (SUTURE) IMPLANT
SUT VIC AB 3-0 SH 27 (SUTURE)
SUT VIC AB 3-0 SH 27X BRD (SUTURE) IMPLANT
SUT VICRYL 4-0 PS2 18IN ABS (SUTURE) IMPLANT
SWAB COLLECTION DEVICE MRSA (MISCELLANEOUS) IMPLANT
SYR 20CC LL (SYRINGE) ×9 IMPLANT
SYR 20ML ECCENTRIC (SYRINGE) ×3 IMPLANT
SYR 30ML LL (SYRINGE) IMPLANT
SYR 5ML LL (SYRINGE) IMPLANT
SYR 5ML LUER SLIP (SYRINGE) IMPLANT
SYR CONTROL 10ML LL (SYRINGE) IMPLANT
SYRINGE 10CC LL (SYRINGE) IMPLANT
TOWEL OR 17X24 6PK STRL BLUE (TOWEL DISPOSABLE) ×3 IMPLANT
TOWEL OR 17X26 10 PK STRL BLUE (TOWEL DISPOSABLE) IMPLANT
TRAP SPECIMEN MUCOUS 40CC (MISCELLANEOUS) ×6 IMPLANT
TUBE ANAEROBIC SPECIMEN COL (MISCELLANEOUS) IMPLANT
TUBE CONNECTING 12X1/4 (SUCTIONS) IMPLANT
TUBE CONNECTING 20X1/4 (TUBING) ×3 IMPLANT
UNDERPAD 30X30 (UNDERPADS AND DIAPERS) ×3 IMPLANT
WATER STERILE IRR 1000ML POUR (IV SOLUTION) IMPLANT

## 2015-06-05 NOTE — H&P (View-Only) (Signed)
PCP is Hannah BeatSpencer Copland, MD Referring Provider is Kalman Shanamaswamy, Murali, MD  Chief Complaint  Patient presents with  . Shortness of Breath    on exertion after bronchitis/pneumonia----infiltrates....Marland Kitchen.eval for BRONCH...PFT 05/04/15, CT CHEST 05/06/15    HPI: A 35 year old woman with a chief complaint of persistent shortness of breath.  Mrs. Madison Holt is a 35 year old woman. She is a former smoker having quit in 1999. 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 She first became ill in the fall of 2014. She was complaining of cough and shortness of breath. She was diagnosed with pneumonia. She was treated with antibiotics and was in the hospital for about a week. She improved but said her symptoms never completely went away. She was treated again for pneumonia in January 2015. She continued to have intermittent issues with cough and shortness of breath with exertion. In October 2016 she had worsening shortness of breath along with fevers and chills. She was treated with antibiotics. Over the past 4 months she's had about 1 week each month where she has fevers and chills and general malaise. She is persistently short of breath with walking or talking and can no longer run.  She has not had any change in weight over the past 3 months. She has no known exposures.  She saw Dr. Marchelle Gearingamaswamy. PFTs were unremarkable. An autoimmune workup showed angiotensin-converting enzyme elevated at 60 and c-ANCA is trace positive at 1:40 but PR 3 and MPO antibodies are negative.  A CT of the chest abdomen and pelvis showed findings suspicious for sarcoidosis.  Past Medical History  Diagnosis Date  . Pneumonia 2014    Hosp x 1 week    No past surgical history on file.  Family History  Problem Relation Age of Onset  . Arthritis Mother   . Hyperlipidemia Mother   . Hyperlipidemia Father   . Diabetes Father   . Breast cancer Maternal Grandmother   . Hypertension Maternal Grandmother   . Colon cancer Maternal Grandfather    . Hypertension Maternal Grandfather   . Heart disease Paternal Grandfather     Social History Social History  Substance Use Topics  . Smoking status: Former Smoker -- 1.00 packs/day for 5 years    Types: Cigarettes    Quit date: 03/07/1998  . Smokeless tobacco: Never Used  . Alcohol Use: 1.2 oz/week    2 Standard drinks or equivalent per week     Comment: soical    Current Outpatient Prescriptions  Medication Sig Dispense Refill  . acetaminophen (TYLENOL) 325 MG tablet Take 650 mg by mouth every 6 (six) hours as needed.    . Fluticasone-Salmeterol (ADVAIR) 100-50 MCG/DOSE AEPB Inhale 1 puff into the lungs 2 (two) times daily. 60 each 5   No current facility-administered medications for this visit.    Allergies  Allergen Reactions  . Codeine     Other reaction(s): Hallucination    Review of Systems  Constitutional: Positive for fever, chills and activity change. Negative for appetite change and unexpected weight change.  Eyes: Positive for visual disturbance (blurry).  Respiratory: Positive for cough, chest tightness and shortness of breath.   Cardiovascular: Positive for chest pain. Negative for leg swelling.  Gastrointestinal: Negative for abdominal pain and blood in stool.  Genitourinary: Negative for dysuria and hematuria.  Musculoskeletal: Negative for joint swelling and arthralgias.  Neurological: Positive for dizziness and light-headedness. Negative for syncope.  Hematological: Negative for adenopathy. Does not bruise/bleed easily.    BP 116/70 mmHg  Pulse  72  Resp 16  Ht 5' 3.5" (1.613 m)  Wt 145 lb (65.772 kg)  BMI 25.28 kg/m2  SpO2 99%  LMP 04/19/2015 Physical Exam  Constitutional: She is oriented to person, place, and time. She appears well-developed and well-nourished. No distress.  HENT:  Head: Normocephalic and atraumatic.  Eyes: Conjunctivae and EOM are normal. Pupils are equal, round, and reactive to light. No scleral icterus.  Neck: Neck  supple. No thyromegaly present.  Cardiovascular: Normal rate, regular rhythm, normal heart sounds and intact distal pulses.  Exam reveals no gallop and no friction rub.   No murmur heard. Pulmonary/Chest: Effort normal and breath sounds normal. No respiratory distress. She has no wheezes. She has no rales.  Abdominal: Soft. She exhibits no distension. There is no tenderness.  Musculoskeletal: She exhibits no edema.  Lymphadenopathy:    She has no cervical adenopathy.  Neurological: She is alert and oriented to person, place, and time. No cranial nerve deficit.  Skin: Skin is warm and dry.  Vitals reviewed.    Diagnostic Tests: CT CHEST, ABDOMEN, AND PELVIS WITH CONTRAST  TECHNIQUE: Multidetector CT imaging of the chest, abdomen and pelvis was performed following the standard protocol during bolus administration of intravenous contrast.  CONTRAST: 100 cc of Omnipaque 300  COMPARISON: High-resolution chest CT of earlier today. No prior abdominal pelvic imaging.  FINDINGS: CT CHEST  CT CHEST FINDINGS  Mediastinum/Nodes: No supraclavicular adenopathy. No axillary adenopathy. Normal heart size, without pericardial effusion. Thoracic adenopathy, as detailed on the prior exam. Example node within the azygoesophageal recess measures 1.8 cm on image 25/series 2.  Right hilar adenopathy at 1.5 cm on image 20/series 2.  Lungs/Pleura: No pleural fluid. Upper lobe predominant perilymphatic nodularity with architectural distortion again identified, and better evaluated on today's high-resolution study.  Musculoskeletal: No acute osseous abnormality.  CT ABDOMEN PELVIS FINDINGS  Hepatobiliary: Mild hepatic steatosis, without focal liver lesion. Hepatomegaly, 20 cm craniocaudal. Variant lateral segment left liver lobe extending into the left upper quadrant. Normal gallbladder, without biliary ductal dilatation.  Pancreas: Normal, without mass or ductal  dilatation.  Spleen: Normal in size, without focal abnormality.  Adrenals/Urinary Tract: Normal adrenal glands. Normal kidneys, without hydronephrosis. Normal urinary bladder.  Stomach/Bowel: Normal stomach, without wall thickening. Normal colon, appendix, and terminal ileum. Normal small bowel.  Vascular/Lymphatic: Normal caliber of the aorta and branch vessels. Small retroperitoneal nodes. None are pathologic by size criteria. No pelvic sidewall adenopathy.  Reproductive: Normal uterus and right adnexa. A left ovarian corpus luteal cyst measures 2.1 cm on image 103/series 2.  Other: Trace free pelvic fluid is likely physiologic.  Musculoskeletal: No acute osseous abnormality.  IMPRESSION: 1. 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 2. No evidence of subdiaphragmatic sarcoid. 3. Hepatomegaly and hepatic steatosis. 4. Left ovarian corpus luteal cyst. 5. Trace free pelvic fluid is likely physiologic.   Electronically Signed  By: Jeronimo Greaves M.D.  On: 05/06/2015 16:37  I personally reviewed the CT chest and concur with findings noted above  Impression: Mrs. Kingsberry is a 35 year old woman with progressive dyspnea and persistent cough who has pulmonary infiltrates and mediastinal and hilar adenopathy. These findings are highly suspicious for sarcoidosis, but she does not yet have a diagnosis. AFB or fungal diseases and lymphoma are also in the differential.  She needs a biopsy to confirm the diagnosis.  I recommended to her that we proceed with bronchoscopy and endobronchial ultrasound to sample the pulmonary infiltrates (biopsy and bronchoalveolar lavage) and mediastinal and hilar lymph nodes.  If those are negative we would proceed with mediastinoscopy at the same setting to obtain better samples of the mediastinal lymph nodes. I reviewed the general nature of the procedure with her. She understands this will be done in the operating room under  general anesthesia. We will plan to do this on an outpatient basis. She understands the intraoperative decision making regarding whether to proceed with mediastinoscopy. I reviewed the indications, risks, benefits, and alternatives. She understands the risks include, but are not limited to bleeding, pneumothorax, infection, recurrent nerve injury leading to hoarseness, esophageal injury, as well as the possibility of other unforeseeable complications.  She accepts the risks and wishes to proceed  Plan: Bronchoscopy, endobronchial ultrasound, possible mediastinoscopy on Friday, 06/05/2015.  Loreli Slot, MD [facility] [phone]

## 2015-06-05 NOTE — Brief Op Note (Signed)
06/05/2015  3:51 PM  PATIENT:  Madison BushmanJennifer L Holt  35 y.o. female  PRE-OPERATIVE DIAGNOSIS:  RIGHT UPPER LOBE INFILTRATE      MEDIASTINAL ADENOPATHY  POST-OPERATIVE DIAGNOSIS:  RIGHT UPPER LOBE INFILTRATE      MEDIASTINAL ADENOPATHY - NON-NECROTIZING GRANULOMAS  PROCEDURE:  Procedure(s): VIDEO BRONCHOSCOPY WITH ENDOBRONCHIAL NAVIGATION (N/A) VIDEO BRONCHOSCOPY WITH ENDOBRONCHIAL ULTRASOUND (N/A)  MEDIASTINOSCOPY (N/A)  SURGEON:  Surgeon(s) and Role:    * Loreli SlotSteven C Petra Dumler, MD - Primary  ANESTHESIA:   general  EBL:  Total I/O In: 1000 [I.V.:1000] Out: 5 [Blood:5]  BLOOD ADMINISTERED:none  DRAINS: none   LOCAL MEDICATIONS USED:  NONE  SPECIMEN:  Source of Specimen:  RUL and mediastinal lymph nodes  DISPOSITION OF SPECIMEN:  PATHOLOGY  COUNTS:  YES  PLAN OF CARE: Discharge to home after PACU  PATIENT DISPOSITION:  PACU - hemodynamically stable.   Delay start of Pharmacological VTE agent (>24hrs) due to surgical blood loss or risk of bleeding: not applicable

## 2015-06-05 NOTE — Progress Notes (Signed)
Pt refused sacral foam prophylaxis. 

## 2015-06-05 NOTE — Discharge Instructions (Signed)
You have a prescription for hydromorphone a narcotic pain medication. You may use as directed.   You may use Tylenol (acetaminophen) or ibuprofen in addition to, or instead of, the hydromorphone.  You may shower tomorrow.  Beginning Monday 4/3- you may begin driving when you are no longer taking the narcotic pain medication.  You may engage in light physical activity, but should avoid heavy physical activity for the next week.  You may cough up small amounts of blood over the next few days, this is to be expected.  There is a medical adhesive over the incision. It will begin to peel off in 10- 14 days.  Call [phone] if you develop chest pain, shortness of breath, fever > 101F or cough up large amounts of blood.  My office will contact you with follow up information.

## 2015-06-05 NOTE — Anesthesia Preprocedure Evaluation (Addendum)
Anesthesia Evaluation  Patient identified by MRN, date of birth, ID band Patient awake    Reviewed: Allergy & Precautions, H&P , NPO status , Patient's Chart, lab work & pertinent test results  Airway Mallampati: II  TM Distance: >3 FB Neck ROM: full    Dental  (+) Teeth Intact, Dental Advidsory Given   Pulmonary shortness of breath and with exertion, pneumonia, resolved, former smoker,    breath sounds clear to auscultation       Cardiovascular  Rhythm:Regular Rate:Normal     Neuro/Psych    GI/Hepatic   Endo/Other    Renal/GU      Musculoskeletal   Abdominal   Peds  Hematology   Anesthesia Other Findings   Reproductive/Obstetrics                          Anesthesia Physical Anesthesia Plan  ASA: III  Anesthesia Plan: General   Post-op Pain Management:    Induction: Intravenous  Airway Management Planned: Oral ETT  Additional Equipment:   Intra-op Plan:   Post-operative Plan:   Informed Consent:   Dental Advisory Given  Plan Discussed with: Anesthesiologist, CRNA and Surgeon  Anesthesia Plan Comments:       Anesthesia Quick Evaluation

## 2015-06-05 NOTE — Interval H&P Note (Signed)
History and Physical Interval Note:  06/05/2015 1:44 PM  Madison BushmanJennifer L Holt  has presented today for surgery, with the diagnosis of RIGHT UPPER LOBE INFILTRATE MEDIASTINAL ADENOPATHY  The various methods of treatment have been discussed with the patient and family. After consideration of risks, benefits and other options for treatment, the patient has consented to  Procedure(s): VIDEO BRONCHOSCOPY WITH ENDOBRONCHIAL NAVIGATION (N/A) VIDEO BRONCHOSCOPY WITH ENDOBRONCHIAL ULTRASOUND (N/A) POSSIBLE MEDIASTINOSCOPY (N/A) as a surgical intervention .  The patient's history has been reviewed, patient examined, no change in status, stable for surgery.  I have reviewed the patient's chart and labs.  Questions were answered to the patient's satisfaction.     Loreli SlotSteven C Leandre Wien

## 2015-06-05 NOTE — Transfer of Care (Signed)
Immediate Anesthesia Transfer of Care Note  Patient: Madison BushmanJennifer L Mcdougall  Procedure(s) Performed: Procedure(s): VIDEO BRONCHOSCOPY WITH ENDOBRONCHIAL NAVIGATION (N/A) VIDEO BRONCHOSCOPY WITH ENDOBRONCHIAL ULTRASOUND (N/A)  MEDIASTINOSCOPY (N/A)  Patient Location: PACU  Anesthesia Type:General  Level of Consciousness: awake and alert   Airway & Oxygen Therapy: Patient Spontanous Breathing and Patient connected to face mask oxygen  Post-op Assessment: Report given to RN, Post -op Vital signs reviewed and stable and Patient moving all extremities X 4  Post vital signs: stable  Last Vitals:  Filed Vitals:   06/05/15 1025  BP: 114/79  Pulse: 76  Temp: 37.2 C  Resp: 20    Complications: No apparent anesthesia complications

## 2015-06-05 NOTE — Anesthesia Procedure Notes (Addendum)
Procedure Name: Intubation Date/Time: 06/05/2015 1:46 PM Performed by: Carmela RimaMARTINELLI, Laiklyn Pilkenton F Pre-anesthesia Checklist: Patient being monitored, Suction available, Emergency Drugs available, Patient identified and Timeout performed Patient Re-evaluated:Patient Re-evaluated prior to inductionOxygen Delivery Method: Circle system utilized Preoxygenation: Pre-oxygenation with 100% oxygen Intubation Type: IV induction Ventilation: Mask ventilation without difficulty Laryngoscope Size: Mac and 3 Grade View: Grade I Tube type: Oral Tube size: 8.5 mm Number of attempts: 1 Placement Confirmation: positive ETCO2,  ETT inserted through vocal cords under direct vision and breath sounds checked- equal and bilateral Secured at: 22 cm Tube secured with: Tape Dental Injury: Teeth and Oropharynx as per pre-operative assessment

## 2015-06-06 LAB — ACID FAST SMEAR (AFB, MYCOBACTERIA): Acid Fast Smear: NEGATIVE

## 2015-06-06 LAB — ACID FAST SMEAR (AFB): ACID FAST SMEAR - AFSCU2: NEGATIVE

## 2015-06-06 NOTE — Op Note (Signed)
Madison, Holt              ACCOUNT NO.:  [id number]  MEDICAL RECORD NO.:  [id number]  LOCATION:  [facility]                         FACILITY:  [facility]  PHYSICIAN:  Salvatore Decent. Dorris Fetch, M.D.DATE OF BIRTH:  1980/10/25  DATE OF PROCEDURE:  06/05/2015 DATE OF DISCHARGE:  06/05/2015                              OPERATIVE REPORT   PREOPERATIVE DIAGNOSIS:  Mediastinal adenopathy and pulmonary infiltrates.  POSTOPERATIVE DIAGNOSIS:  Non-caseating granulomas consistent with sarcoidosis.  PROCEDURE:   Electromagnetic navigational bronchoscopy with brushings, biopsies, and  bronchoalveolar lavage.   Endobronchial ultrasound with mediastinal lymph node aspirations.   Mediastinoscopy.  ASSISTANT:  None.  ANESTHESIA:  General.  FINDINGS:  Aspirations of level 7 node revealed lymphoid tissue but non-diagnostic. Biopsies of right upper lobe showed giant cells but no definite granulomas Enlarged, granular 4R lymph node.  Frozen section revealed non-caseating granulomas.  CLINICAL NOTE:  Madison Holt is a 35 year old woman who has been suffering from persistent shortness of breath.  A CT showed findings suspicious for sarcoidosis with pulmonary infiltrates, and mediastinal and hilar adenopathy.  She was advised to undergo navigational bronchoscopy, endobronchial ultrasound, and possible mediastinoscopy for diagnostic purposes.  She understood that mediastinoscopy may be necessary depending on the findings of bronchoscopy and endobronchial ultrasound.  OPERATIVE NOTE:  Madison Holt was brought to the operating room on June 05, 2015.  She had induction of general anesthesia and was intubated. She was given intravenous antibiotics.  Sequential compressive devices were placed on the calves for DVT prophylaxis. After performing a time-out, flexible fiberoptic bronchoscopy was performed via the endotracheal tube.  It revealed normal endobronchial anatomy with no endobronchial lesions.  Endobronchial  ultrasound then was performed. There were markedly enlarged level 4R and 7 nodes.  The 7 node appeared to be the easiest to aspirate. Multiple aspirations were obtained from the node. With each aspiration, the needle was advanced into the node with ultrasound visualization and 10 passes were made with the needle with suction applied.  The needle aspirate then was applied to slides.  The bronchoscope was reinserted and the locatable guide for navigation was inserted, registration was performed and there was good correlation of the virtual and video bronchoscopy.  The locatable guide then was navigated to within 1 cm of the site selected for biopsy on the right upper lobe infiltrate. Brushings were obtained as were multiple biopsies.  Part of the biopsies were sent for AFB and fungal cultures, the remainder were sent for pathology.  A bronchoalveolar lavage was performed with the specimen being sent for AFB and fungal cultures.  The bronchoscope was withdrawn.  The specimens were sent to pathology. Findings on the lymph node aspiration showed the lymphoid cells but no diagnosis could be made.  There were giant cells present on the bronchoscopic brushings but no definite granulomas and the decision was made to proceed with mediastinoscopy as discussed with the patient preoperatively.  The neck and chest were prepped and draped in the usual sterile fashion.  An incision was made 1 fingerbreadth above the sternal notch.  It was carried through the skin and subcutaneous tissue.  The strap muscles were separated in the midline.  The pretracheal fascia was identified and  incised and the pretracheal plane was developed bluntly into the mediastinum.  Mediastinoscope was inserted.  A large 4R lymph node was easily identified.  This was whitish in color and granular in texture. Biopsies were taken and sent for frozen section.  The remainder of the node was removed in pieces. One piece was sent for  AFB and fungal cultures and the remainder was sent for permanent pathology.  The wound was packed with gauze.  After 5 minutes the gauze packing was removed, and the mediastinoscope was reinserted. There was good hemostasis, and the mediastinoscope was withdrawn.  Frozen section revealed non-caseating granulomas consistent with sarcoidosis.  The incision was closed with 3- 0 Vicryl interrupted sutures in the platysma and a 4-0 Vicryl subcuticular suture.  Dermabond was applied.  The patient was extubated in the operating room and taken the postanesthetic care unit in good condition.     Salvatore DecentSteven C. Dorris FetchHendrickson, M.D.     SCH/MEDQ  D:  06/05/2015  T:  06/06/2015  Job:  [id number]

## 2015-06-08 ENCOUNTER — Telehealth: Payer: Self-pay | Admitting: Internal Medicine

## 2015-06-08 ENCOUNTER — Encounter (HOSPITAL_COMMUNITY): Payer: Self-pay | Admitting: Thoracic Surgery (Cardiothoracic Vascular Surgery)

## 2015-06-08 LAB — ACID FAST SMEAR (AFB, MYCOBACTERIA)

## 2015-06-08 LAB — TISSUE CULTURE

## 2015-06-08 LAB — ACID FAST SMEAR (AFB): ACID FAST SMEAR - AFSCU2: NEGATIVE

## 2015-06-08 NOTE — Telephone Encounter (Signed)
Elise  pls have Ian BushmanJennifer L Heyboer come to see TP or an aPP next few weeks to disucss bx that is c/w sarcoid and start on prednisone at 40mg  per day ti taper to 10mg  per day over 6 weeks   Dr. Kalman ShanMurali Arelie Kuzel, M.D., [facility].C.P Pulmonary and Critical Care Medicine Staff Physician  System [facility] Pager: [phone], If no answer or between  15:00h - 7:00h: call [phone]  06/08/2015 6:29 PM

## 2015-06-09 ENCOUNTER — Other Ambulatory Visit: Payer: Self-pay | Admitting: *Deleted

## 2015-06-09 ENCOUNTER — Other Ambulatory Visit: Payer: Self-pay

## 2015-06-09 DIAGNOSIS — R110 Nausea: Secondary | ICD-10-CM

## 2015-06-09 DIAGNOSIS — G8918 Other acute postprocedural pain: Secondary | ICD-10-CM

## 2015-06-09 MED ORDER — TRAMADOL HCL 50 MG PO TABS: 50.0000 mg | ORAL_TABLET | Freq: Four times a day (QID) | ORAL | Status: DC | PRN

## 2015-06-09 MED ORDER — PROMETHAZINE HCL 25 MG PO TABS: 25.0000 mg | ORAL_TABLET | Freq: Four times a day (QID) | ORAL | Status: DC | PRN

## 2015-06-09 NOTE — Telephone Encounter (Signed)
lmtcb for pt.  

## 2015-06-09 NOTE — Telephone Encounter (Signed)
Dr Dorris FetchHendrickson approved a RX for Promethazine 25 mg and it was called to [facility]. Patient is aware.

## 2015-06-10 NOTE — Telephone Encounter (Signed)
lmtcb for pt.  

## 2015-06-11 ENCOUNTER — Other Ambulatory Visit: Payer: Self-pay | Admitting: *Deleted

## 2015-06-11 DIAGNOSIS — D860 Sarcoidosis of lung: Secondary | ICD-10-CM

## 2015-06-11 NOTE — Telephone Encounter (Signed)
lmtcb for pt.  

## 2015-06-11 NOTE — Telephone Encounter (Signed)
Called and spoke to pt. Informed her of the recs per MR. Appt made with TP on 06/29/15. Pt verbalized understanding and denied any further questions or concerns at this time.

## 2015-06-11 NOTE — Telephone Encounter (Signed)
[phone], pt cb

## 2015-06-12 ENCOUNTER — Ambulatory Visit (INDEPENDENT_AMBULATORY_CARE_PROVIDER_SITE_OTHER): Payer: BLUE CROSS/BLUE SHIELD | Admitting: Internal Medicine

## 2015-06-12 ENCOUNTER — Encounter: Payer: Self-pay | Admitting: Internal Medicine

## 2015-06-12 VITALS — BP 120/72 | HR 112 | Temp 98.1°F | Resp 16 | Wt 140.0 lb

## 2015-06-12 DIAGNOSIS — D860 Sarcoidosis of lung: Secondary | ICD-10-CM

## 2015-06-12 DIAGNOSIS — R509 Fever, unspecified: Secondary | ICD-10-CM | POA: Diagnosis not present

## 2015-06-12 MED ORDER — PREDNISONE 10 MG PO TABS: 40.0000 mg | ORAL_TABLET | Freq: Every day | ORAL | Status: DC

## 2015-06-12 MED ORDER — SULFAMETHOXAZOLE-TRIMETHOPRIM 800-160 MG PO TABS: 1.0000 | ORAL_TABLET | Freq: Two times a day (BID) | ORAL | Status: DC

## 2015-06-12 NOTE — Assessment & Plan Note (Signed)
Having another systemic spell with this Will start the planned prednisone now

## 2015-06-12 NOTE — Progress Notes (Signed)
Pre visit review using our clinic review tool, if applicable. No additional management support is needed unless otherwise documented below in the visit note. 

## 2015-06-12 NOTE — Assessment & Plan Note (Signed)
Her symptom complex with systemic involvement is the same as 4 recent previous times--related apparently to sarcoidosis Will go ahead and start her treatment I am concerned about the growth of Serratia from the lymph node that was removed. It is unlikely that this illness represents bacterial infection--but certainly possible. I will empirically start septra Note to Tammy Parrett to see if she has other thoughts

## 2015-06-12 NOTE — Progress Notes (Signed)
Subjective:    Patient ID: Madison BushmanJennifer L Holt, female    DOB: 1980-12-17, 35 y.o.   MRN: [id number]  HPI Here due to feeling sick  Starting 4 days ago--not feeling well Repeated vomiting No diarrhea Fever and chills/shakes Feels dizzy/disoriented  Lymph node biopsy/mediastinoscopy done 1 week Sent home that day Dry cough---some blood (expected from the procedure) No nausea--just vomits Appetite is gone No abdominal pain Quite SOB--but no change from since this all started in October  This feels the same as she has had 5 times recently--leading to sarcoidosis diagnosis Current Outpatient Prescriptions on File Prior to Visit  Medication Sig Dispense Refill  . acetaminophen (TYLENOL) 325 MG tablet Take 650 mg by mouth every 6 (six) hours as needed for mild pain or headache.     . Fluticasone-Salmeterol (ADVAIR) 100-50 MCG/DOSE AEPB Inhale 1 puff into the lungs 2 (two) times daily. 60 each 5  . HYDROmorphone (DILAUDID) 2 MG tablet Take 1-2 tablets (2-4 mg total) by mouth every 6 (six) hours as needed for moderate pain or severe pain. 30 tablet 0  . Naphazoline HCl (CLEAR EYES OP) Place 1 drop into both eyes as needed (for dry eyes).    . promethazine (PHENERGAN) 25 MG tablet Take 1 tablet (25 mg total) by mouth every 6 (six) hours as needed for nausea or vomiting. 30 tablet 0  . traMADol (ULTRAM) 50 MG tablet Take 1 tablet (50 mg total) by mouth every 6 (six) hours as needed. 40 tablet 0   No current facility-administered medications on file prior to visit.    Allergies  Allergen Reactions  . Codeine Other (See Comments)    Other reaction(s): Hallucination    Past Medical History  Diagnosis Date  . Pneumonia 2014    Hosp x 1 week  . Shortness of breath dyspnea     Past Surgical History  Procedure Laterality Date  . Esophagogastroduodenoscopy    . Video bronchoscopy with endobronchial navigation N/A 06/05/2015    Procedure: VIDEO BRONCHOSCOPY WITH ENDOBRONCHIAL  NAVIGATION;  Surgeon: Loreli SlotSteven C Hendrickson, MD;  [facility];  Service: Thoracic;  Laterality: N/A;  . Video bronchoscopy with endobronchial ultrasound N/A 06/05/2015    Procedure: VIDEO BRONCHOSCOPY WITH ENDOBRONCHIAL ULTRASOUND;  Surgeon: Loreli SlotSteven C Hendrickson, MD;  [facility];  Service: Thoracic;  Laterality: N/A;  . Mediastinoscopy N/A 06/05/2015    Procedure:  MEDIASTINOSCOPY;  Surgeon: Loreli SlotSteven C Hendrickson, MD;  [facility];  Service: Thoracic;  Laterality: N/A;    Family History  Problem Relation Age of Onset  . Arthritis Mother   . Hyperlipidemia Mother   . Hyperlipidemia Father   . Diabetes Father   . Breast cancer Maternal Grandmother   . Hypertension Maternal Grandmother   . Colon cancer Maternal Grandfather   . Hypertension Maternal Grandfather   . Heart disease Paternal Grandfather     Social History   Social History  . Marital Status: Divorced    Spouse Name: N/A  . Number of Children: 3  . Years of Education: N/A   Occupational History  . Salon coordinator    Social History Main Topics  . Smoking status: Former Smoker -- 1.00 packs/day for 5 years    Types: Cigarettes    Quit date: 03/07/1998  . Smokeless tobacco: Never Used  . Alcohol Use: 1.2 oz/week    2 Standard drinks or equivalent per week     Comment: soical  . Drug Use: No  . Sexual Activity:  Partners: Male    Pharmacist, hospital Protection: Surgical     Comment: Vasectomy   Other Topics Concern  . Not on file   Social History Narrative   Works as Engineer, petroleum    Children: All boys, 16, 12, 6   Kay and Onalee Hua Hunter's daughter   Review of Systems No one else sick Everything hurts Throat/under chin hurts when vomits    Objective:   Physical Exam  Constitutional:  Slightly diaphoretic but no distress  HENT:  Mouth/Throat: Oropharynx is clear and moist. No oropharyngeal exudate.  Neck: No thyromegaly present.  Very sensitive in anterior neck near mediastinoscopy  site Wound is clean and dry--no inflammation  Pulmonary/Chest: Effort normal and breath sounds normal. No respiratory distress. She has no wheezes. She has no rales.  Abdominal: Soft. She exhibits no distension and no mass. There is no tenderness. There is no rebound and no guarding.  No HSM  Lymphadenopathy:    She has no cervical adenopathy.    She has no axillary adenopathy.       Right: No inguinal adenopathy present.       Left: No inguinal adenopathy present.  Skin: No rash noted.          Assessment & Plan:

## 2015-06-12 NOTE — Patient Instructions (Signed)
Start the prednisone at 4 tabs daily. If you feel good, and back to normal, after 10-14 days, you can reduce it to 30mg  daily. Keep your pulmonary follow up appointment Also take the antibiotic unless you hear otherwise.

## 2015-06-16 NOTE — Anesthesia Postprocedure Evaluation (Signed)
Anesthesia Post Note  Patient: Madison BushmanJennifer L Holt  Procedure(s) Performed: Procedure(s) (LRB): VIDEO BRONCHOSCOPY WITH ENDOBRONCHIAL NAVIGATION (N/A) VIDEO BRONCHOSCOPY WITH ENDOBRONCHIAL ULTRASOUND (N/A)  MEDIASTINOSCOPY (N/A)  Patient location during evaluation: PACU Anesthesia Type: General Level of consciousness: awake, awake and alert and oriented Pain management: pain level controlled Vital Signs Assessment: post-procedure vital signs reviewed and stable Respiratory status: spontaneous breathing, nonlabored ventilation and respiratory function stable Cardiovascular status: blood pressure returned to baseline Anesthetic complications: no    Last Vitals:  Filed Vitals:   06/05/15 1715 06/05/15 1730  BP: 116/82 113/71  Pulse: 92 98  Temp:  36.9 C  Resp: 11 12    Last Pain:  Filed Vitals:   06/05/15 1757  PainSc: 4                  Madison Holt

## 2015-06-23 ENCOUNTER — Encounter: Payer: Self-pay | Admitting: Thoracic Surgery (Cardiothoracic Vascular Surgery)

## 2015-06-23 ENCOUNTER — Ambulatory Visit (INDEPENDENT_AMBULATORY_CARE_PROVIDER_SITE_OTHER): Payer: BLUE CROSS/BLUE SHIELD | Admitting: Thoracic Surgery (Cardiothoracic Vascular Surgery)

## 2015-06-23 VITALS — BP 120/80 | HR 94 | Resp 20 | Ht 64.0 in | Wt 140.0 lb

## 2015-06-23 DIAGNOSIS — Z9889 Other specified postprocedural states: Secondary | ICD-10-CM | POA: Diagnosis not present

## 2015-06-23 DIAGNOSIS — R918 Other nonspecific abnormal finding of lung field: Secondary | ICD-10-CM

## 2015-06-23 DIAGNOSIS — R599 Enlarged lymph nodes, unspecified: Secondary | ICD-10-CM | POA: Diagnosis not present

## 2015-06-23 DIAGNOSIS — R590 Localized enlarged lymph nodes: Secondary | ICD-10-CM

## 2015-06-23 NOTE — Progress Notes (Signed)
      [address]       Madison Holt,[place] [id number]             [id number]       HPI: Madison Holt returns today for a postoperative follow-up visit.  She is a 35 year old woman who gentleman to have interstitial lung infiltrates and mediastinal adenopathy. She had bronchoscopy, endobronchial ultrasound, and mediastinoscopy on 06/05/2015.  She did well for the first few days after surgery but then developed severe nausea. She saw Dr. Alphonsus Holt on 06/12/2015 and was started on prednisone 40 mg daily. She has an appointment with Dr. Marchelle Holt on Monday.  She is not having any significant incisional pain. She does have some numbness in the neck around the incision and says sometimes it swells on the right side.  Past Medical History  Diagnosis Date  . Pneumonia 2014    Hosp x 1 week  . Shortness of breath dyspnea      Current Outpatient Prescriptions  Medication Sig Dispense Refill  . acetaminophen (TYLENOL) 325 MG tablet Take 650 mg by mouth every 6 (six) hours as needed for mild pain or headache.     . predniSONE (DELTASONE) 10 MG tablet Take 4 tablets (40 mg total) by mouth daily with breakfast. Wean as directed 120 tablet 0   No current facility-administered medications for this visit.    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 She has started on prednisone. Her nausea has resolved but she hasn't noticed a significant improvement in her breathing to this point. I told her I think the pulmonary manifestations will take a little longer to improve.  She is doing well from a surgical standpoint and there  are no restrictions on her activities.  She has no point with Dr. Marchelle Holt on Monday.  Plan: I will be happy to see Madison Holt back any time in the future if I can be of any further assistance with her care.  Madison SlotSteven C Madison Wisham, MD [facility] [phone]

## 2015-06-29 ENCOUNTER — Encounter: Payer: Self-pay | Admitting: Adult Health

## 2015-06-29 ENCOUNTER — Ambulatory Visit (INDEPENDENT_AMBULATORY_CARE_PROVIDER_SITE_OTHER): Payer: BLUE CROSS/BLUE SHIELD | Admitting: Adult Health

## 2015-06-29 VITALS — BP 116/90 | HR 89 | Temp 98.2°F | Ht 63.0 in | Wt 153.0 lb

## 2015-06-29 DIAGNOSIS — D860 Sarcoidosis of lung: Secondary | ICD-10-CM

## 2015-06-29 NOTE — Progress Notes (Signed)
Subjective:    Patient ID: Madison Holt, female    DOB: 09-11-1980, 35 y.o.   MRN: [id number]  HPI 35 yo Female former smoker seen for pulmonary consult 05/10/2005 by Dr. Marchelle Gearing for shortness of breath and cough.  TEST Pulmonary function tests of 05/04/2015 normal  Autoimmune workup 05/06/2015 extensive panel is essentially normal except angiotensin-converting enzyme is elevated at 60 and c-ANCA is trace positive at 1:40 but PR 3 and MPO antibodies are negative.  05/06/2015 she also had CT chest with contrast and high resolution CT chest without contrast: There classic pulmonary infiltrates and hilar and subcarinal lymphadenopathy. Overall pattern is consistent with sarcoidosis radiologically according to the radiologist. I personally visualized this film.   Walking desat test 185 feet X 3 laps on RA -> did not desatrurate   06/29/2015 Follow up : Bx results Patient returns for a follow-up. Patient was seen last month for pulmonary consult for shortness of breath and cough and suspected sarcoid. Patient had been having cough, shortness of breath. Pulmonary function test was normal. An autoimmune panel in March was essentially negative except for cANCA that was trace positive. Ace level was elevated. CT chest showed pulmonary infiltrates and hilar and subcarinal lymphadenopathy. Patient was sent for a surgical consult. Patient underwent a bronchoscopy, endobronchial ultrasound and mediastinoscopy on 06/05/15 . Surgical pathology came back for nonnecrotizing granuloma and hyaline fibrosis in the lymph nodes. Lung biopsy showed no granulomas or malignancy. Patient was seen by [facility] care on April 7 for acute illness with chills and severe nausea. She was started on prednisone 40 mg. She says that her acute symptoms have resolved. She has not seen any change in her breathing. She continues to have fatigue, cough. She denies any rash, chest pain, orthopnea, PND, hemoptysis, orthopnea. She says  that she has gained some weight since starting prednisone. We discussed healthy dietary choices and exercise. She says that she does have some visual changes. Over the last year. Recommend that she make an appointment with ophthalmology for a annual exam and that she will need annual eye exams yearly. She is currently on prednisone 40 mg daily     Past Medical History  Diagnosis Date  . Pneumonia 2014    Hosp x 1 week  . Shortness of breath dyspnea    Current Outpatient Prescriptions on File Prior to Visit  Medication Sig Dispense Refill  . acetaminophen (TYLENOL) 325 MG tablet Take 650 mg by mouth every 6 (six) hours as needed for mild pain or headache.     . predniSONE (DELTASONE) 10 MG tablet Take 4 tablets (40 mg total) by mouth daily with breakfast. Wean as directed 120 tablet 0   No current facility-administered medications on file prior to visit.     Review of Systems Constitutional:   No  weight loss, night sweats,  Fevers, chills,  +fatigue, or  lassitude.  HEENT:   No headaches,  Difficulty swallowing,  Tooth/dental problems, or  Sore throat,                No sneezing, itching, ear ache, nasal congestion, post nasal drip,   CV:  No chest pain,  Orthopnea, PND, swelling in lower extremities, anasarca, dizziness, palpitations, syncope.   GI  No heartburn, indigestion, abdominal pain, nausea, vomiting, diarrhea, change in bowel habits, loss of appetite, bloody stools.   Resp:   No chest wall deformity  Skin: no rash or lesions.  GU: no dysuria, change in color of urine,  no urgency or frequency.  No flank pain, no hematuria   MS:  No joint pain or swelling.  No decreased range of motion.  No back pain.  Psych:  No change in mood or affect. No depression or anxiety.  No memory loss.          Objective:   Physical Exam Filed Vitals:   06/29/15 1025  BP: 116/90  Pulse: 89  Temp: 98.2 F (36.8 C)  TempSrc: Oral  Height: 5\' 3"  (1.6 m)  Weight: 153 lb (69.4  kg)  SpO2: 96%    GEN: A/Ox3; pleasant , NAD, well nourished   HEENT:  [place]/AT,  EACs-clear, TMs-wnl, NOSE-clear, THROAT-clear, no lesions, no postnasal drip or exudate noted.   NECK:  Supple w/ fair ROM; no JVD; normal carotid impulses w/o bruits; no thyromegaly or nodules palpated; no lymphadenopathy.  RESP  Clear  P & A; w/o, wheezes/ rales/ or rhonchi.no accessory muscle use, no dullness to percussion  CARD:  RRR, no m/r/g  , no peripheral edema, pulses intact, no cyanosis or clubbing.  GI:   Soft & nt; nml bowel sounds; no organomegaly or masses detected.  Musco: Warm bil, no deformities or joint swelling noted.   Neuro: alert, no focal deficits noted.    Skin: Warm, no lesions or rashes  Hridhaan Yohn NP-C  [facility]  06/29/2015        Assessment & Plan:

## 2015-06-29 NOTE — Patient Instructions (Signed)
Decrease Prednisone 30mg  daily for 2 weeks then 20mg  daily for 2 weeks then hold at 10mg  daily .  follow up Dr. Marchelle Gearingamaswamy in 6 weeks and As needed   Make an eye appointment for annual exam .  Please contact office for sooner follow up if symptoms do not improve or worsen or seek emergency care

## 2015-06-29 NOTE — Assessment & Plan Note (Signed)
Nonnecrotizing granulomas on lung biopsy consistent with sarcoid Patient is to continue on prednisone. Steroid patient education given. We'll slowly taper prednisone. Patient is to follow back up in office in 6 weeks. Sarcoid patient education. Discussed with patient. She is advised to get yearly eye exams.  Plan  Decrease Prednisone 30mg  daily for 2 weeks then 20mg  daily for 2 weeks then hold at 10mg  daily .  follow up Dr. Marchelle Gearingamaswamy in 6 weeks and As needed   Make an eye appointment for annual exam .  Please contact office for sooner follow up if symptoms do not improve or worsen or seek emergency care

## 2015-06-30 ENCOUNTER — Encounter: Payer: Self-pay | Admitting: Family Medicine

## 2015-07-06 LAB — FUNGUS CULTURE WITH STAIN

## 2015-07-06 LAB — FUNGUS CULTURE RESULT

## 2015-07-06 LAB — FUNGAL ORGANISM REFLEX

## 2015-07-07 LAB — FUNGUS CULTURE WITH STAIN

## 2015-07-07 LAB — FUNGUS CULTURE RESULT

## 2015-07-07 LAB — FUNGAL ORGANISM REFLEX

## 2015-07-20 LAB — ACID FAST CULTURE WITH REFLEXED SENSITIVITIES (MYCOBACTERIA)
Acid Fast Culture: NEGATIVE
Acid Fast Culture: NEGATIVE

## 2015-07-20 LAB — ACID FAST CULTURE WITH REFLEXED SENSITIVITIES: ACID FAST CULTURE - AFSCU3: NEGATIVE

## 2015-08-10 ENCOUNTER — Ambulatory Visit (INDEPENDENT_AMBULATORY_CARE_PROVIDER_SITE_OTHER): Payer: BLUE CROSS/BLUE SHIELD | Admitting: Internal Medicine

## 2015-08-10 ENCOUNTER — Encounter: Payer: Self-pay | Admitting: Internal Medicine

## 2015-08-10 VITALS — BP 118/78 | HR 93 | Ht 63.0 in | Wt 156.2 lb

## 2015-08-10 DIAGNOSIS — D860 Sarcoidosis of lung: Secondary | ICD-10-CM | POA: Diagnosis not present

## 2015-08-10 MED ORDER — PREDNISONE 10 MG PO TABS
10.0000 mg | ORAL_TABLET | Freq: Every day | ORAL | Status: DC
Start: 2015-08-10 — End: 2016-01-01

## 2015-08-10 NOTE — Patient Instructions (Addendum)
ICD-9-CM ICD-10-CM   1. Pulmonary sarcoidosis (HCC) 135 D86.0    517.8      Pulmonary sarcoidosis stage 2 - symptoms  better with steroids and some worse off steroids  Plan - restart prednisone 10mg  daily  - as discussed this is a long term therapy 1-2 years - to counteract weight gain try low glycemic diet    - take [facility] low glycemic diet sheet - hold off cxr 08/10/2015 as discussed - in future methotrexate an option  - if needing high dose steroids, or too much symptoms or progressive disease or steroid side efect  Followup - rov 3 months  - at followup can discuss drop in prednisone to 5mg  per day - call or return sooner if neeeded

## 2015-08-10 NOTE — Progress Notes (Signed)
Subjective:     Patient ID: Madison Holt, female   DOB: 1980/08/05, 35 y.o.   MRN: [id number]  HPI  35 yo Female former smoker seen for pulmonary consult 05/10/2005 by Dr. Marchelle Gearing for shortness of breath and cough.  TEST Pulmonary function tests of 05/04/2015 normal  Autoimmune workup 05/06/2015 extensive panel is essentially normal except angiotensin-converting enzyme is elevated at 60 and c-ANCA is trace positive at 1:40 but PR 3 and MPO antibodies are negative.  05/06/2015 she also had CT chest with contrast and high resolution CT chest without contrast: There classic pulmonary infiltrates and hilar and subcarinal lymphadenopathy. Overall pattern is consistent with sarcoidosis radiologically according to the radiologist. I personally visualized this film.   Walking desat test 185 feet X 3 laps on RA -> did not desatrurate   06/29/2015 Follow up : Bx results Patient returns for a follow-up. Patient was seen last month for pulmonary consult for shortness of breath and cough and suspected sarcoid. Patient had been having cough, shortness of breath. Pulmonary function test was normal. An autoimmune panel in March was essentially negative except for cANCA that was trace positive. Ace level was elevated (PR-3 and MPO negative). CT chest showed pulmonary infiltrates and hilar and subcarinal lymphadenopathy. Patient was sent for a surgical consult. Patient underwent a bronchoscopy, endobronchial ultrasound and mediastinoscopy on 06/05/15 . Surgical pathology came back for nonnecrotizing granuloma and hyaline fibrosis in the lymph nodes. Lung biopsy showed no granulomas or malignancy. Patient was seen by [facility] care on April 7 for acute illness with chills and severe nausea. She was started on prednisone 40 mg. She says that her acute symptoms have resolved. She has not seen any change in her breathing. She continues to have fatigue, cough. She denies any rash, chest pain, orthopnea, PND,  hemoptysis, orthopnea. She says that she has gained some weight since starting prednisone. We discussed healthy dietary choices and exercise. She says that she does have some visual changes. Over the last year. Recommend that she make an appointment with ophthalmology for a annual exam and that she will need annual eye exams yearly. She is currently on prednisone 40 mg daily   REC pred and taper and hold at 10mg  per day  OV 08/10/2015  Chief Complaint  Patient presents with  . Follow-up    Pt c/o occasional dry cough and SOB/chest tightness with exertion. Pt denies wheeze/CP. Pt states that her breathing is improving. She has noticed that weather does affect her breathing, mostly with extremes in air temperature and humidity.      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  Summoned nurse practitioner 06/29/2015. At that point in time she already been on a few weeks of prednisone at 40 mg per day. At that visit 06/29/2015 she was better. She was asked to taper her prednisone and continue at 10 mg per day. However she ran out of the bottle and she stopped taking the prednisone one week ago. However it turns out that she is reluctant to take prednisone because of weight gain. She says she has gained 30 pounds of weight which is more than her pregnancy weight gain in the past. She does admit that prednisone helped her a lot. While shortness of breath cough chest tightness was all improved with prednisone significantly but now off prednisone for weeks symptoms are returning. The heat and humidity do bother her more significantly. Per hx - eeye exam - no sarcoid  Wondering about measures she  could' do to coutneract weight gain      has a past medical history of Pneumonia (2014) and Shortness of breath dyspnea.   reports that she quit smoking about 17 years ago. Her smoking use included Cigarettes. She has a 5 pack-year smoking history. She has never used  smokeless tobacco.  Past Surgical History  Procedure Laterality Date  . Esophagogastroduodenoscopy    . Video bronchoscopy with endobronchial navigation N/A 06/05/2015    Procedure: VIDEO BRONCHOSCOPY WITH ENDOBRONCHIAL NAVIGATION;  Surgeon: Loreli SlotSteven C Hendrickson, MD;  [facility];  Service: Thoracic;  Laterality: N/A;  . Video bronchoscopy with endobronchial ultrasound N/A 06/05/2015    Procedure: VIDEO BRONCHOSCOPY WITH ENDOBRONCHIAL ULTRASOUND;  Surgeon: Loreli SlotSteven C Hendrickson, MD;  [facility];  Service: Thoracic;  Laterality: N/A;  . Mediastinoscopy N/A 06/05/2015    Procedure:  MEDIASTINOSCOPY;  Surgeon: Loreli SlotSteven C Hendrickson, MD;  [facility];  Service: Thoracic;  Laterality: N/A;    Allergies  Allergen Reactions  . Codeine Other (See Comments)    Other reaction(s): Hallucination     There is no immunization history on file for this patient.  Family History  Problem Relation Age of Onset  . Arthritis Mother   . Hyperlipidemia Mother   . Hyperlipidemia Father   . Diabetes Father   . Breast cancer Maternal Grandmother   . Hypertension Maternal Grandmother   . Colon cancer Maternal Grandfather   . Hypertension Maternal Grandfather   . Heart disease Paternal Grandfather      Current outpatient prescriptions:  .  acetaminophen (TYLENOL) 325 MG tablet, Take 650 mg by mouth every 6 (six) hours as needed for mild pain or headache. , Disp: , Rfl:  .  Fluticasone-Salmeterol (ADVAIR DISKUS) 100-50 MCG/DOSE AEPB, Inhale 1 puff into the lungs 2 (two) times daily., Disp: , Rfl:     Review of Systems     Objective:   Physical Exam  Constitutional: She is oriented to person, place, and time. She appears well-developed and well-nourished. No distress.  HENT:  Head: Normocephalic and atraumatic.  Right Ear: External ear normal.  Left Ear: External ear normal.  Mouth/Throat: Oropharynx is clear and moist. No oropharyngeal exudate.  Mediastinoscopy scar +  Eyes:  Conjunctivae and EOM are normal. Pupils are equal, round, and reactive to light. Right eye exhibits no discharge. Left eye exhibits no discharge. No scleral icterus.  Neck: Normal range of motion. Neck supple. No JVD present. No tracheal deviation present. No thyromegaly present.  Cardiovascular: Normal rate, regular rhythm, normal heart sounds and intact distal pulses.  Exam reveals no gallop and no friction rub.   No murmur heard. Pulmonary/Chest: Effort normal and breath sounds normal. No respiratory distress. She has no wheezes. She has no rales. She exhibits no tenderness.  Abdominal: Soft. Bowel sounds are normal. She exhibits no distension and no mass. There is no tenderness. There is no rebound and no guarding.  Musculoskeletal: Normal range of motion. She exhibits no edema or tenderness.  Lymphadenopathy:    She has no cervical adenopathy.  Neurological: She is alert and oriented to person, place, and time. She has normal reflexes. No cranial nerve deficit. She exhibits normal muscle tone. Coordination normal.  Skin: Skin is warm and dry. No rash noted. She is not diaphoretic. No erythema. No pallor.  Psychiatric: She has a normal mood and affect. Her behavior is normal. Judgment and thought content normal.  Vitals reviewed.   Filed Vitals:   08/10/15 1525  BP: 118/78  Pulse: 93  Height:  (1.6 m)  Weight: 156 lb 3.2 oz (70.852 kg)  SpO2: 97%       Assessment:       ICD-9-CM ICD-10-CM   1. Pulmonary sarcoidosis (HCC) 135 D86.0    517.8         Plan:      Pulmonary sarcoidosis stage 2 - symptoms  better with steroids and some worse off steroids Explained natural hx of few to several years to expect burnout Explained role of prednisone to modulate symptoms and prvent progression  Plan - restart prednisone  daily  - as discussed this is a long term therapy 1-2 years - to counteract weight gain try low glycemic diet    - take sheet - explaiend low carb diet -  hold off cxr 08/10/2015 as discussed - in future methotrexate an option  - if needing high dose steroids, or too much symptoms or progressive disease or steroid side efect  Followup - rov 3 months  - at followup can discuss drop in prednisone to  per day - call or return sooner if neeeded   > 50% of this > 25 min visit spent in face to face counseling or coordination of care    Dr. [name], M.D., [facility].C.P Pulmonary and Critical Care Medicine Staff Physician [facility] Pager: [phone], If no answer or between  15:00h - 7:00h: call [phone]  08/10/2015 3:48 PM

## 2015-08-25 ENCOUNTER — Encounter: Payer: Self-pay | Admitting: Internal Medicine

## 2015-08-26 NOTE — Telephone Encounter (Signed)
MR please advise if we can write doctor's note. Thanks.

## 2015-08-27 ENCOUNTER — Encounter: Payer: Self-pay | Admitting: *Deleted

## 2015-08-27 NOTE — Telephone Encounter (Signed)
Letter has been typed and pinned on to corkboard over *[phone] extension in Triage until patient lets us know if she wants this mailed or picked up.

## 2015-08-27 NOTE — Telephone Encounter (Signed)
Ok to do note to partk closer . I can sign 08/27/2015\ or 08/28/15

## 2015-11-16 ENCOUNTER — Encounter: Payer: Self-pay | Admitting: Internal Medicine

## 2015-11-16 ENCOUNTER — Ambulatory Visit (INDEPENDENT_AMBULATORY_CARE_PROVIDER_SITE_OTHER): Payer: BLUE CROSS/BLUE SHIELD | Admitting: Internal Medicine

## 2015-11-16 VITALS — BP 118/82 | HR 85 | Ht 63.0 in | Wt 155.6 lb

## 2015-11-16 DIAGNOSIS — D860 Sarcoidosis of lung: Secondary | ICD-10-CM

## 2015-11-16 MED ORDER — FOLIC ACID 1 MG PO TABS: 1.0000 mg | ORAL_TABLET | Freq: Every day | ORAL | 5 refills | Status: DC

## 2015-11-16 MED ORDER — METHOTREXATE 2.5 MG PO TABS: 7.5000 mg | ORAL_TABLET | ORAL | 5 refills | Status: DC

## 2015-11-16 NOTE — Addendum Note (Signed)
Addended by: Garfield CorneaMABRY, Errik Mitchelle L on: 11/16/2015 05:51 PM   Modules accepted: Orders

## 2015-11-16 NOTE — Patient Instructions (Addendum)
ICD-9-CM ICD-10-CM   1. Pulmonary sarcoidosis (HCC) 135 D86.0    517.8      Pulmonary sarcoidosis stage 2 - symptoms  better with steroids but you are having steroid intolerance   Plan -[facility] [place] - do cbc, bmet, LFT at [facility]/ - start methotrexate 7.5mg  (2.5mg  x 3 tablets) once a week  - take every Sunday (*any one day of week) - start folic acid 1mg  per day with methotrexate  -stay off prednisone - flu shot at work  Followup - CBC, bmet, [facility] - rov 4-6 weeks - call or return sooner if neeeded

## 2015-11-16 NOTE — Progress Notes (Signed)
Subjective:     Patient ID: Madison Holt, female   DOB: 11-07-1980, 35 y.o.   MRN: [id number]  PCP Hannah Beat, MD   HPI   35 yo Female former smoker seen for pulmonary consult 05/10/2005 by Dr. Marchelle Gearing for shortness of breath and cough.  TEST Pulmonary function tests of 05/04/2015 normal  Autoimmune workup 05/06/2015 extensive panel is essentially normal except angiotensin-converting enzyme is elevated at 60 and c-ANCA is trace positive at 1:40 but PR 3 and MPO antibodies are negative.  05/06/2015 she also had CT chest with contrast and high resolution CT chest without contrast: There classic pulmonary infiltrates and hilar and subcarinal lymphadenopathy. Overall pattern is consistent with sarcoidosis radiologically according to the radiologist. I personally visualized this film.   Walking desat test 185 feet X 3 laps on RA -> did not desatrurate   06/29/2015 Follow up : Bx results Patient returns for a follow-up. Patient was seen last month for pulmonary consult for shortness of breath and cough and suspected sarcoid. Patient had been having cough, shortness of breath. Pulmonary function test was normal. An autoimmune panel in March was essentially negative except for cANCA that was trace positive. Ace level was elevated (PR-3 and MPO negative). CT chest showed pulmonary infiltrates and hilar and subcarinal lymphadenopathy. Patient was sent for a surgical consult. Patient underwent a bronchoscopy, endobronchial ultrasound and mediastinoscopy on 06/05/15 . Surgical pathology came back for nonnecrotizing granuloma and hyaline fibrosis in the lymph nodes. Lung biopsy showed no granulomas or malignancy. Patient was seen by [facility] care on April 7 for acute illness with chills and severe nausea. She was started on prednisone 40 mg. She says that her acute symptoms have resolved. She has not seen any change in her breathing. She continues to have fatigue, cough. She denies any rash, chest  pain, orthopnea, PND, hemoptysis, orthopnea. She says that she has gained some weight since starting prednisone. We discussed healthy dietary choices and exercise. She says that she does have some visual changes. Over the last year. Recommend that she make an appointment with ophthalmology for a annual exam and that she will need annual eye exams yearly. She is currently on prednisone 40 mg daily   REC pred and taper and hold at 10mg  per day  OV 08/10/2015  Chief Complaint  Patient presents with  . Follow-up    Pt c/o occasional dry cough and SOB/chest tightness with exertion. Pt denies wheeze/CP. Pt states that her breathing is improving. She has noticed that weather does affect her breathing, mostly with extremes in air temperature and humidity.      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 At that point in time she already been on a few weeks of prednisone at 40 mg per day. At that visit 06/29/2015 she was better. She was asked to taper her prednisone and continue at 10 mg per day. However she ran out of the bottle and she stopped taking the prednisone one week ago. However it turns out that she is reluctant to take prednisone because of weight gain. She says she has gained 30 pounds of weight which is more than her pregnancy weight gain in the past. She does admit that prednisone helped her a lot. While shortness of breath cough chest tightness was all improved with prednisone significantly but now off prednisone for weeks symptoms are returning. The heat and humidity do bother her more significantly. Per hx - eeye exam -  no sarcoid  Wondering about measures she could' do to coutneract weight gain   OV 11/16/2015  Chief Complaint  Patient presents with  . Follow-up    been off steriods for x 1 month and more sob faster, dry cough, chest pain and some tightness   Follow-up stage II pulmonary sarcoidosis-diagnosis  established March 2017  Last visit June 2017. We reinitiated prednisone. However she went to the beach and forgot to take prednisone. She has not taken prednisone in over a month. With this she says that her mood swings are better. She still has some sleep issues. Being off prednisone initially she was fine but now she feels the shortness of breath and cough are coming back. She struggling between managing the side effects of prednisone and the symptoms of sarcoidosis. Last chest x-ray 06/05/2015 at the time diagnosis was made. Last pulmonary function test was in February 2017 for diagnosis was made. 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 She no longer works in a [employer].     has a past medical history of Pneumonia (2014) and Shortness of breath dyspnea.   reports that she quit smoking about 17 years ago. Her smoking use included Cigarettes. She has a 5.00 pack-year smoking history. She has never used smokeless tobacco.  Past Surgical History:  Procedure Laterality Date  . ESOPHAGOGASTRODUODENOSCOPY    . MEDIASTINOSCOPY N/A 06/05/2015   Procedure:  MEDIASTINOSCOPY;  Surgeon: Loreli Slot, MD;  Location: [facility] OR;  Service: Thoracic;  Laterality: N/A;  . VIDEO BRONCHOSCOPY WITH ENDOBRONCHIAL NAVIGATION N/A 06/05/2015   Procedure: VIDEO BRONCHOSCOPY WITH ENDOBRONCHIAL NAVIGATION;  Surgeon: Loreli Slot, MD;  Location: [facility] OR;  Service: Thoracic;  Laterality: N/A;  . VIDEO BRONCHOSCOPY WITH ENDOBRONCHIAL ULTRASOUND N/A 06/05/2015   Procedure: VIDEO BRONCHOSCOPY WITH ENDOBRONCHIAL ULTRASOUND;  Surgeon: Loreli Slot, MD;  Location: [facility] OR;  Service: Thoracic;  Laterality: N/A;    Allergies  Allergen Reactions  . Codeine Other (See Comments)    Other reaction(s): Hallucination     There is no immunization history on file for this patient.  Family History  Problem Relation Age of Onset  . Arthritis Mother   .  Hyperlipidemia Mother   . Hyperlipidemia Father   . Diabetes Father   . Breast cancer Maternal Grandmother   . Hypertension Maternal Grandmother   . Colon cancer Maternal Grandfather   . Hypertension Maternal Grandfather   . Heart disease Paternal Grandfather      Current Outpatient Prescriptions:  .  acetaminophen (TYLENOL) 325 MG tablet, Take 650 mg by mouth every 6 (six) hours as needed for mild pain or headache. , Disp: , Rfl:  .  Fluticasone-Salmeterol (ADVAIR DISKUS) 100-50 MCG/DOSE AEPB, Inhale 1 puff into the lungs 2 (two) times daily., Disp: , Rfl:  .  predniSONE (DELTASONE) 10 MG tablet, Take 1 tablet (10 mg total) by mouth daily with breakfast. (Patient not taking: Reported on 11/16/2015), Disp: 30 tablet, Rfl: 5    Review of Systems     Objective:   Physical Exam  Constitutional: She is oriented to person, place, and time. She appears well-developed and well-nourished. No distress.  HENT:  Head: Normocephalic and atraumatic.  Right Ear: External ear normal.  Left Ear: External ear normal.  Mouth/Throat: Oropharynx is clear and moist. No oropharyngeal exudate.  Eyes: Conjunctivae and EOM are normal. Pupils are equal, round, and reactive to light. Right eye exhibits no  discharge. Left eye exhibits no discharge. No scleral icterus.  Neck: Normal range of motion. Neck supple. No JVD present. No tracheal deviation present. No thyromegaly present.  Cardiovascular: Normal rate, regular rhythm, normal heart sounds and intact distal pulses.  Exam reveals no gallop and no friction rub.   No murmur heard. Pulmonary/Chest: Effort normal and breath sounds normal. No respiratory distress. She has no wheezes. She has no rales. She exhibits no tenderness.  Abdominal: Soft. Bowel sounds are normal. She exhibits no distension and no mass. There is no tenderness. There is no rebound and no guarding.  Musculoskeletal: Normal range of motion. She exhibits no edema or tenderness.   Lymphadenopathy:    She has no cervical adenopathy.  Neurological: She is alert and oriented to person, place, and time. She has normal reflexes. No cranial nerve deficit. She exhibits normal muscle tone. Coordination normal.  Skin: Skin is warm and dry. No rash noted. She is not diaphoretic. No erythema. No pallor.  Psychiatric: She has a normal mood and affect. Her behavior is normal. Judgment and thought content normal.  Vitals reviewed.  Vitals:   11/16/15 1702  BP: 118/82  Pulse: 85  SpO2: 97%  Weight: 155 lb 9.6 oz (70.6 kg)  Height: 5\' 3"  (1.6 m)   Estimated body mass index is 27.56 kg/m as calculated from the following:   Height as of this encounter: 5\' 3"  (1.6 m).   Weight as of this encounter: 155 lb 9.6 oz (70.6 kg).       Assessment:       ICD-9-CM ICD-10-CM   1. Pulmonary sarcoidosis (HCC) 135 D86.0 DG Chest 2 View   517.8  CBC     Basic Metabolic Panel (BMET)     Hepatic function panel   She's having steroid intolerance. She is concerned about the weight gain. However when she comes off the prednisone symptoms of sarcoidosis or shortness of breath and cough come back. At this point in time he was switched to second line therapy. We discussed this in detail. Currently she is off prednisone. We discussed methotrexate as an option. Given his immunosuppressive effects and the fact she is not on prednisone right now without we will just start her on methotrexate. I discussed the side effects of methotrexate in detail and gave her handout. She will have complete blood count and chemistry and liver function test in [place] before she starts the methotrexate. She also do a chest x-ray before she starts methotrexate. We'll start her on low-dose methotrexate 7.5 mg once a week along with folic acid for rescue. At this point in time because of single agent immunosuppression that will not be need for Bactrim. He'll get a flu shot at work. I will see her in 4-6 weeks with repeat  lab work and evaluation of his symptoms. I've cautioned that methotrexate can take take anywhere from 4-12 weeks to start working      Plan:      Pulmonary sarcoidosis stage 2 - symptoms  better with steroids but you are having steroid intolerance   Plan -[facility] [place] - do cbc, bmet, LFT at [facility] - start methotrexate 7.5mg  (2.5mg  x 3 tablets) once a week  - take every Sunday (*any one day of week) - start folic acid 1mg  per day with methotrexate  -stay off prednisone - flu shot at work  Followup - CBC, bmet, [facility] - rov 4-6 weeks -  call or return sooner if neeeded      Dr. [name], M.D., [facility].C.P Pulmonary and Critical Care Medicine Staff Physician [facility] Pulmonary and Critical Care Pager: [phone], If no answer or between  15:00h - 7:00h: call [phone]  11/16/2015 5:45 PM

## 2015-11-20 ENCOUNTER — Ambulatory Visit
Admission: RE | Admit: 2015-11-20 | Discharge: 2015-11-20 | Disposition: A | Discharge status: Home / Self Care | Payer: Managed Care, Other (non HMO) | Source: Ambulatory Visit | Attending: Internal Medicine | Admitting: Internal Medicine

## 2015-11-20 ENCOUNTER — Other Ambulatory Visit
Admission: RE | Admit: 2015-11-20 | Discharge: 2015-11-20 | Disposition: A | Discharge status: Home / Self Care | Payer: Managed Care, Other (non HMO) | Source: Ambulatory Visit | Attending: Internal Medicine | Admitting: Internal Medicine

## 2015-11-20 DIAGNOSIS — D860 Sarcoidosis of lung: Secondary | ICD-10-CM

## 2015-11-20 LAB — CBC
HEMATOCRIT: 42.4 % (ref 35.0–47.0)
Hemoglobin: 14.7 g/dL (ref 12.0–16.0)
MCH: 29.5 pg (ref 26.0–34.0)
MCHC: 34.7 g/dL (ref 32.0–36.0)
MCV: 84.9 fL (ref 80.0–100.0)
PLATELETS: 248 10*3/uL (ref 150–440)
RBC: 5 MIL/uL (ref 3.80–5.20)
RDW: 12.9 % (ref 11.5–14.5)
WBC: 7.7 10*3/uL (ref 3.6–11.0)

## 2015-11-20 LAB — HEPATIC FUNCTION PANEL
ALBUMIN: 4.4 g/dL (ref 3.5–5.0)
ALT: 42 U/L (ref 14–54)
AST: 30 U/L (ref 15–41)
Alkaline Phosphatase: 47 U/L (ref 38–126)
BILIRUBIN DIRECT: 0.1 mg/dL (ref 0.1–0.5)
BILIRUBIN TOTAL: 0.7 mg/dL (ref 0.3–1.2)
Indirect Bilirubin: 0.6 mg/dL (ref 0.3–0.9)
Total Protein: 7.8 g/dL (ref 6.5–8.1)

## 2015-11-20 LAB — BASIC METABOLIC PANEL
ANION GAP: 7 (ref 5–15)
BUN: 16 mg/dL (ref 6–20)
CALCIUM: 9.2 mg/dL (ref 8.9–10.3)
CO2: 25 mmol/L (ref 22–32)
CREATININE: 0.95 mg/dL (ref 0.44–1.00)
Chloride: 102 mmol/L (ref 101–111)
Glucose, Bld: 82 mg/dL (ref 65–99)
Potassium: 3.8 mmol/L (ref 3.5–5.1)
SODIUM: 134 mmol/L — AB (ref 135–145)

## 2015-11-23 ENCOUNTER — Telehealth: Payer: Self-pay | Admitting: Internal Medicine

## 2015-11-23 NOTE — Telephone Encounter (Signed)
  Let Madison BushmanJennifer L Holt  Know cxr - cw sarcoid - nothing new findings. Labs normal   Dr. Kalman ShanMurali Esmee Fallaw, M.D., [facility].C.P Pulmonary and Critical Care Medicine Staff Physician [place] [facility] [name]: [phone], If no answer or between  15:00h - 7:00h: call [phone]  11/23/2015 12:45 PM       Dg Chest 2 View  Result Date: 11/20/2015 CLINICAL DATA:  Sarcoidosis. History of previous tobacco use, previous episodes of pneumonia. EXAM: CHEST  2 VIEW COMPARISON:  PA and lateral chest x-ray of June 05, 2015 FINDINGS: The lungs are mildly hyper inflated. There are stable increased interstitial densities diffusely with areas of confluence in the right upper lobe and left mid lung. There is no pleural effusion. There is no pneumothorax. The heart and pulmonary vascularity are normal. The mediastinum is normal in width. There is stable gentle levocurvature centered at T11-12. There is no compression fracture. IMPRESSION: Chronic interstitial changes with areas of parenchymal scarring consistent with sarcoidosis. No alveolar pneumonia. No bulky mediastinal or hilar lymphadenopathy. Electronically Signed   By: David  Swazil[place]Jordan M.D.   On: 11/20/2015 15:19    PULMONARY No results for input(s): PHART, PCO2ART, PO2ART, HCO3, TCO2, O2SAT in the last 168 hours.  Invalid input(s): PCO2, PO2  CBC  Recent Labs Lab 11/20/15 1725  HGB 14.7  HCT 42.4  WBC 7.7  PLT 248    COAGULATION No results for input(s): INR in the last 168 hours.  CARDIAC  No results for input(s): TROPONINI in the last 168 hours. No results for input(s): PROBNP in the last 168 hours.   CHEMISTRY  Recent Labs Lab 11/20/15 1725  NA 134*  K 3.8  CL 102  CO2 25  GLUCOSE 82  BUN 16  CREATININE 0.95  CALCIUM 9.2   Estimated Creatinine Clearance: 77.2 mL/min (by C-G formula based on SCr of 0.95 mg/dL).   LIVER  Recent Labs Lab 11/20/15 1725  AST 30  ALT 42  ALKPHOS 47   BILITOT 0.7  PROT 7.8  ALBUMIN 4.4     INFECTIOUS No results for input(s): LATICACIDVEN, PROCALCITON in the last 168 hours.   ENDOCRINE CBG (last 3)  No results for input(s): GLUCAP in the last 72 hours.

## 2015-11-23 NOTE — Telephone Encounter (Signed)
Results have been explained to patient, pt expressed understanding. Nothing further needed.  

## 2015-11-23 NOTE — Telephone Encounter (Signed)
lmtcb X1 for pt to relay results/recs.  

## 2015-12-07 ENCOUNTER — Encounter: Payer: Self-pay | Admitting: Internal Medicine

## 2015-12-07 NOTE — Telephone Encounter (Signed)
LMTCB to get further info

## 2015-12-08 ENCOUNTER — Encounter: Payer: Self-pay | Admitting: Internal Medicine

## 2015-12-08 NOTE — Telephone Encounter (Signed)
Naused and vomit can happen < 10% of the time with mehtotrexate; I personally have not see in it so reall world might be < 5% of the tme . Hve questions  1. What is the exact dose and schedule she is taking? It is a weekly medication and need to know she did not get a wrong fill on this  2. How long has she been taking?  3. Is she taking bactrim with it?  4. IS she taking folic acido or MVT with it?  5. Is there diarrhea?  6. aNy resp symptoms?  THanks  Dr. Kalman ShanMurali Alexica Schlossberg, M.D., [facility].C.P Pulmonary and Critical Care Medicine Staff Physician [place] [facility] [place] Pulmonary and Critical Care Pager: [phone], If no answer or between  15:00h - 7:00h: call [phone]  12/08/2015 1:40 PM

## 2015-12-08 NOTE — Telephone Encounter (Signed)
LMTCB

## 2015-12-08 NOTE — Telephone Encounter (Signed)
MR - Please see email and advise. Thanks!

## 2015-12-09 NOTE — Telephone Encounter (Signed)
LMTCB

## 2015-12-09 NOTE — Telephone Encounter (Signed)
Spoke with pt. Here are her answers:  1. 3 - 2.5mg  tabs every week 2. 3 weeks 3. No 4. Yes 5. No 6. SOB, chest tightness, wheezing and cough are present  MR - please advise. Thanks.

## 2015-12-09 NOTE — Telephone Encounter (Signed)
Pt returning call

## 2015-12-18 ENCOUNTER — Encounter: Payer: Self-pay | Admitting: Internal Medicine

## 2015-12-18 ENCOUNTER — Ambulatory Visit: Payer: Self-pay | Admitting: Internal Medicine

## 2015-12-18 NOTE — Telephone Encounter (Signed)
See below email from patient:  Hey.. I'm interested in getting the B12 shot. With this new medication that I'm on, it's making me depressed, tired, moody, and not myself. I'm usually a bubbly, out-going person, but lately, I'm not feeling my normal self. I've been looking into the B12 shot and was wondering if this is something that Dr. Marchelle Gearingamaswamy would recommend for me while taking Methotrexate.  MR please advise.  Thanks!

## 2015-12-22 NOTE — Telephone Encounter (Signed)
With methotrexate - folic acid B9 is recommended. She should be on a mutlivitamin that has or straight up folic acid 1mg  daily. No evidence that B12 is needed for her situation unless PCP Hannah BeatSpencer Copland, MD thinks there is b12 deficiency.   Also, some evidence new that vit d could be hhavine a role in some of her symptos - has this been checked. If not, please order  Thanks  Dr. Kalman ShanMurali Tayten Heber, M.D., [facility].P Pulmonary and Critical Care Medicine Staff Physician [place] [facility] [place] Pulmonary and Critical Care Pager: [phone], If no answer or between  15:00h - 7:00h: call [phone]  12/22/2015 2:42 PM

## 2016-01-01 ENCOUNTER — Ambulatory Visit (INDEPENDENT_AMBULATORY_CARE_PROVIDER_SITE_OTHER): Payer: Managed Care, Other (non HMO) | Admitting: Internal Medicine

## 2016-01-01 ENCOUNTER — Encounter: Payer: Self-pay | Admitting: Internal Medicine

## 2016-01-01 ENCOUNTER — Other Ambulatory Visit (INDEPENDENT_AMBULATORY_CARE_PROVIDER_SITE_OTHER): Payer: Managed Care, Other (non HMO)

## 2016-01-01 VITALS — BP 102/70 | HR 84 | Ht 63.0 in | Wt 154.6 lb

## 2016-01-01 DIAGNOSIS — D860 Sarcoidosis of lung: Secondary | ICD-10-CM | POA: Diagnosis not present

## 2016-01-01 DIAGNOSIS — Z5181 Encounter for therapeutic drug level monitoring: Secondary | ICD-10-CM | POA: Diagnosis not present

## 2016-01-01 DIAGNOSIS — R5383 Other fatigue: Secondary | ICD-10-CM

## 2016-01-01 NOTE — Progress Notes (Signed)
Subjective:     Patient ID: Madison Holt, female   DOB: March 24, 1980, 35 y.o.   MRN: [id number]  HPI    35 yo Female former smoker seen for pulmonary consult 05/10/2005 by Dr. Marchelle Gearing for shortness of breath and cough.  TEST Pulmonary function tests of 05/04/2015 normal  Autoimmune workup 05/06/2015 extensive panel is essentially normal except angiotensin-converting enzyme is elevated at 60 and c-ANCA is trace positive at 1:40 but PR 3 and MPO antibodies are negative.  05/06/2015 she also had CT chest with contrast and high resolution CT chest without contrast: There classic pulmonary infiltrates and hilar and subcarinal lymphadenopathy. Overall pattern is consistent with sarcoidosis radiologically according to the radiologist. I personally visualized this film.   Walking desat test 185 feet X 3 laps on RA -> did not desatrurate   06/29/2015 Follow up : Bx results Patient returns for a follow-up. Patient was seen last month for pulmonary consult for shortness of breath and cough and suspected sarcoid. Patient had been having cough, shortness of breath. Pulmonary function test was normal. An autoimmune panel in March was essentially negative except for cANCA that was trace positive. Ace level was elevated (PR-3 and MPO negative). CT chest showed pulmonary infiltrates and hilar and subcarinal lymphadenopathy. Patient was sent for a surgical consult. Patient underwent a bronchoscopy, endobronchial ultrasound and mediastinoscopy on 06/05/15 . Surgical pathology came back for nonnecrotizing granuloma and hyaline fibrosis in the lymph nodes. Lung biopsy showed no granulomas or malignancy. Patient was seen by [facility] care on April 7 for acute illness with chills and severe nausea. She was started on prednisone 40 mg. She says that her acute symptoms have resolved. She has not seen any change in her breathing. She continues to have fatigue, cough. She denies any rash, chest pain, orthopnea, PND,  hemoptysis, orthopnea. She says that she has gained some weight since starting prednisone. We discussed healthy dietary choices and exercise. She says that she does have some visual changes. Over the last year. Recommend that she make an appointment with ophthalmology for a annual exam and that she will need annual eye exams yearly. She is currently on prednisone 40 mg daily   REC pred and taper and hold at 10mg  per day  OV 08/10/2015  Chief Complaint  Patient presents with  . Follow-up    Pt c/o occasional dry cough and SOB/chest tightness with exertion. Pt denies wheeze/CP. Pt states that her breathing is improving. She has noticed that weather does affect her breathing, mostly with extremes in air temperature and humidity.      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 At that point in time she already been on a few weeks of prednisone at 40 mg per day. At that visit 06/29/2015 she was better. She was asked to taper her prednisone and continue at 10 mg per day. However she ran out of the bottle and she stopped taking the prednisone one week ago. However it turns out that she is reluctant to take prednisone because of weight gain. She says she has gained 30 pounds of weight which is more than her pregnancy weight gain in the past. She does admit that prednisone helped her a lot. While shortness of breath cough chest tightness was all improved with prednisone significantly but now off prednisone for weeks symptoms are returning. The heat and humidity do bother her more significantly. Per hx - eeye exam - no sarcoid  Wondering about  measures she could' do to coutneract weight gain   OV 11/16/2015  Chief Complaint  Patient presents with  . Follow-up    been off steriods for x 1 month and more sob faster, dry cough, chest pain and some tightness   Follow-up stage II pulmonary sarcoidosis-diagnosis established March  2017  Last visit June 2017. We reinitiated prednisone. However she went to the beach and forgot to take prednisone. She has not taken prednisone in over a month. With this she says that her mood swings are better. She still has some sleep issues. Being off prednisone initially she was fine but now she feels the shortness of breath and cough are coming back. She struggling between managing the side effects of prednisone and the symptoms of sarcoidosis. Last chest x-ray 06/05/2015 at the time diagnosis was made. Last pulmonary function test was in February 2017 for diagnosis was made. 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     has a past medical history of Pneumonia (2014) and Shortness of breath dyspnea.   reports that she quit smoking about 17 years ago. Her smoking use included Cigarettes. She has a 5.00 pack-year smoking history. She has never used smokeless tobacco.   OV 01/01/2016  Chief Complaint  Patient presents with  . Follow-up    Pt states she is taking Methotrexate and has not noticed a difference. Pt states the methotrexate makes her have depression, anxious, nausea, fatigued. Pt c/o dry cough - no change, chest pain - no change from last OV.    Follow-up stage II pulmonary sarcoidosis normal bony function tests spring 2017 at the time of diagnosis but significant respirator symptoms  Last visit 11/16/2015. At that visit she is reporting significant side effects especially mood swings with prednisone even though it was helping her symptoms. We opted for methotrexate 7.5 mg once weekly. She now presents for follow-up. She tells me that the methotrexate is ineffective with respirator symptoms of cough and shortness of breath. In addition she is taking it compliantly but is having some GI side effects of nausea and vomiting and also mood swings. She talk to mother was on the same medication  for rheumatoid arthritis and apparently she has similar side effects. She feels very strongly the methotrexate as the cause of the side effects. She feels extremely frustrated because of the lack of control of a sarcoid symptoms and side effects from therapy. She is up-to-date with her flu shot. She is looking for other options. She says she is getting 30 pounds or so and is now sedentary and is upset this is not her personality. She is open to attending pulmonary rehabilitation. Mentioned she is reporting significant fatigue by end of the day. She is wondering about her thyroid, vitamin D levels. Off note she is on Advair and this is causing significant tremors  Most recent lab work 11/20/2015 of CBC chemistry liver function tests all normal    has a past medical history of Pneumonia (2014) and Shortness of breath dyspnea.   reports that she quit smoking about 17 years ago. Her smoking use included Cigarettes. She has a 5.00 pack-year smoking history. She has never used smokeless tobacco.  Past Surgical History:  Procedure Laterality Date  . ESOPHAGOGASTRODUODENOSCOPY    . MEDIASTINOSCOPY N/A 06/05/2015   Procedure:  MEDIASTINOSCOPY;  Surgeon: Loreli Slot, MD;  Location: [facility] OR;  Service: Thoracic;  Laterality: N/A;  . VIDEO BRONCHOSCOPY WITH ENDOBRONCHIAL NAVIGATION N/A 06/05/2015   Procedure: VIDEO BRONCHOSCOPY WITH ENDOBRONCHIAL NAVIGATION;  Surgeon: Loreli SlotSteven C Hendrickson, MD;  [facility];  Service: Thoracic;  Laterality: N/A;  . VIDEO BRONCHOSCOPY WITH ENDOBRONCHIAL ULTRASOUND N/A 06/05/2015   Procedure: VIDEO BRONCHOSCOPY WITH ENDOBRONCHIAL ULTRASOUND;  Surgeon: Loreli SlotSteven C Hendrickson, MD;  Location: [facility] OR;  Service: Thoracic;  Laterality: N/A;    Allergies  Allergen Reactions  . Codeine Other (See Comments)    Other reaction(s): Hallucination    Immunization History  Administered Date(s) Administered  . Influenza,inj,Quad PF,36+ Mos 12/21/2015    Family History  Problem  Relation Age of Onset  . Arthritis Mother   . Hyperlipidemia Mother   . Hyperlipidemia Father   . Diabetes Father   . Breast cancer Maternal Grandmother   . Hypertension Maternal Grandmother   . Colon cancer Maternal Grandfather   . Hypertension Maternal Grandfather   . Heart disease Paternal Grandfather      Current Outpatient Prescriptions:  .  acetaminophen (TYLENOL) 325 MG tablet, Take 650 mg by mouth every 6 (six) hours as needed for mild pain or headache. , Disp: , Rfl:  .  Fluticasone-Salmeterol (ADVAIR DISKUS) 100-50 MCG/DOSE AEPB, Inhale 1 puff into the lungs 2 (two) times daily., Disp: , Rfl:  .  folic acid (FOLVITE) 1 MG tablet, Take 1 tablet (1 mg total) by mouth daily., Disp: 30 tablet, Rfl: 5 .  methotrexate (RHEUMATREX) 2.5 MG tablet, Take 3 tablets (7.5 mg total) by mouth once a week. Caution:Chemotherapy. Protect from light., Disp: 12 tablet, Rfl: 5    Review of Systems     Objective:   Physical Exam  Constitutional: She is oriented to person, place, and time. She appears well-developed and well-nourished. No distress.  HENT:  Head: Normocephalic and atraumatic.  Right Ear: External ear normal.  Left Ear: External ear normal.  Mouth/Throat: Oropharynx is clear and moist. No oropharyngeal exudate.  Scar mediastinoscopy  Eyes: Conjunctivae and EOM are normal. Pupils are equal, round, and reactive to light. Right eye exhibits no discharge. Left eye exhibits no discharge. No scleral icterus.  Neck: Normal range of motion. Neck supple. No JVD present. No tracheal deviation present. No thyromegaly present.  Cardiovascular: Normal rate, regular rhythm, normal heart sounds and intact distal pulses.  Exam reveals no gallop and no friction rub.   No murmur heard. Pulmonary/Chest: Effort normal and breath sounds normal. No respiratory distress. She has no wheezes. She has no rales. She exhibits no tenderness.  Abdominal: Soft. Bowel sounds are normal. She exhibits no  distension and no mass. There is no tenderness. There is no rebound and no guarding.  Musculoskeletal: Normal range of motion. She exhibits no edema or tenderness.  Lymphadenopathy:    She has no cervical adenopathy.  Neurological: She is alert and oriented to person, place, and time. She has normal reflexes. No cranial nerve deficit. She exhibits normal muscle tone. Coordination normal.  Skin: Skin is warm and dry. No rash noted. She is not diaphoretic. No erythema. No pallor.  Psychiatric: She has a normal mood and affect. Her behavior is normal. Judgment and thought content normal.  Vitals reviewed.   Vitals:   01/01/16 1212  BP: 102/70  Pulse: 84  SpO2: 98%  Weight: 154 lb 9.6 oz (70.1 kg)  Height: 5\' 3"  (1.6 m)    Estimated body mass index is 27.39 kg/m as calculated from the following:   Height as of  this encounter: 5\' 3"  (1.6 m).   Weight as of this encounter: 154 lb 9.6 oz (70.1 kg).      Assessment:       ICD-9-CM ICD-10-CM   1. Pulmonary sarcoidosis (HCC) 135 D86.0    517.8    2. Other fatigue 780.79 R53.83   3. Encounter for therapeutic drug monitoring V58.83 Z51.81    Despite normal pulmonary function test she continues to have symptoms of sarcoidosis or shortness of breath and cough associated with intolerance to Advair form of tremors and methotrexate in the form of mood swings and GI side effects. It is possible to 50 comes from the medications on the sarcoidosis itself.     Plan:     We discussed several options of therapy including reducing methotrexate and she removing long-acting beta agonist in her inhalers and limiting only due to inhaled steroid. Other option is to add a low-dose prednisone for the above. Another option was to do complete observation therapy. Other option was to do a second opinion at [facility] at [facility]. Other option was to do TNF- Alpha blockade. We discussed the advantages, disadvantages and limitations of each approach. We  decided to go with option 1 and a joint decision making.  Therefore we will reduce methotrexate to 5 mg once daily and switch her Advair to inhaled corticosteroid. In addition we will also check for vitamin D levels and TSH levels. We'll also get her referred to pulmonary rehabilitation. We are hoping that in the next 6 weeks or so the combination of this will help control her respiration symptoms and not give side effects.   > 50% of this > 25 min visit spent in face to face counseling or coordination of care    Dr. [name], M.D., [facility].C.P Pulmonary and Critical Care Medicine Staff Physician [facility]monary and Critical Care Pager: [phone], If no answer or between  15:00h - 7:00h: call [phone]  01/01/2016 12:52 PM

## 2016-01-01 NOTE — Patient Instructions (Addendum)
ICD-9-CM ICD-10-CM   1. Pulmonary sarcoidosis (HCC) 135 D86.0    517.8    2. Other fatigue 780.79 R53.83   3. Encounter for therapeutic drug monitoring V58.83 Z51.81      Pulmonary sarcoidosis stage 2 - lot of side effects with treatment Fatigue can be disease or therapy related  Plan -  do Vit D, TSH,  LFT at [facility] 01/01/2016 - will call with results - stop advair . Instead just do Inhaled steroid daily QVAR 40mcg 2 puff twice daily sample - cut down  methotrexate 5mg  (2.5mg  x 2 tablets) once a week  - take every Sunday (*any one day of week) - continue folic acid 1mg  per day with methotrexate  -stay off prednisone per our discussion - [facility]  Followup - PFT in 6 weeks - rov 6 weeks - call or return sooner if neeeded

## 2016-01-04 LAB — VITAMIN D 25 HYDROXY (VIT D DEFICIENCY, FRACTURES): VITD: 29.75 ng/mL — ABNORMAL LOW (ref 30.00–100.00)

## 2016-01-04 LAB — TSH: TSH: 0.32 u[IU]/mL — ABNORMAL LOW (ref 0.35–4.50)

## 2016-01-05 ENCOUNTER — Telehealth: Payer: Self-pay | Admitting: Internal Medicine

## 2016-01-05 MED ORDER — VITAMIN D3 1.25 MG (50000 UT) PO CAPS: 50000.0000 [IU] | ORAL_CAPSULE | ORAL | 2 refills | Status: DC

## 2016-01-05 NOTE — Telephone Encounter (Signed)
Called and spoke to pt. Informed her of the results and recs per MR. Rx sent to preferred pharmacy. Pt verbalized understanding and denied any further questions or concerns at this time.   

## 2016-01-05 NOTE — Telephone Encounter (Signed)
TSH - lower side of normal - she should talk to pcp Hannah BeatSpencer Copland, MD about referral to endocrine  Vit D is lower - just below normaml  - vitamin D3 50,000 (50k) units once a week x 12 weeks, then once a month on first of each month  - this cannot be vitamin d2. But has to to be Vit D3  - If pharmacy only has vitamin d2 let me know, then he should get replesta (spl vit d3) at same dose - replesta can be obtained by following instructions at [email or web address]   The above can account for fatigue  Dr. Kalman ShanMurali Andyn Sales, M.D., [facility].C.P Pulmonary and Critical Care Medicine Staff Physician [facility]monary and Critical Care Pager: [phone], If no answer or between  15:00h - 7:00h: call [phone]  01/05/2016 9:04 AM

## 2016-01-12 ENCOUNTER — Ambulatory Visit: Payer: Self-pay | Admitting: Internal Medicine

## 2016-01-15 ENCOUNTER — Telehealth (HOSPITAL_COMMUNITY): Payer: Self-pay

## 2016-01-15 NOTE — Telephone Encounter (Signed)
Called patient in regards to Pulmonary Rehab referral. Mailbox was full so I was unable to leave a message. Letter already sent in mail. This is the third attempt.

## 2016-02-03 ENCOUNTER — Telehealth: Payer: Self-pay | Admitting: Internal Medicine

## 2016-02-03 DIAGNOSIS — J849 Interstitial pulmonary disease, unspecified: Secondary | ICD-10-CM

## 2016-02-03 NOTE — Telephone Encounter (Signed)
Called and spoke to pt. [facility] is [facility].   PCC's please advise if this requires a new order. Thanks.

## 2016-02-04 NOTE — Telephone Encounter (Signed)
Will need order place for [facility] then

## 2016-02-04 NOTE — Telephone Encounter (Signed)
Order placed. Pt aware. Nothing further needed at this time.

## 2016-02-24 ENCOUNTER — Telehealth: Payer: Self-pay | Admitting: Internal Medicine

## 2016-02-24 NOTE — Telephone Encounter (Signed)
Sent pt a mychart message in case she has questions,.

## 2016-02-24 NOTE — Telephone Encounter (Signed)
Called and spoke with pt and she stated that the referral for pulmonary rehab had been placed but then she requested that this be changed to [facility].  Pt is wanting to see when she will be able to start this/  [facility].  thanks

## 2016-02-24 NOTE — Telephone Encounter (Signed)
Patient is returning phone call.  °

## 2016-02-24 NOTE — Telephone Encounter (Signed)
Attempted to call patient, but VM was full. Will attempt to call later.

## 2016-02-24 NOTE — Telephone Encounter (Signed)
Order was put in on 02/04/16 for pt to have Pulm Rehab [facility].  It usually takes a couple of weeks for them to contact the pt to set up rehab.  I was going to call the pt & give her [facility]'s Pulm Rehab Dept ph # - [phone] but pt's vm is full & I was unable to leave message.  I called [facility] & spoke to [place]ica.  She states they tried to call pt on 12/5 & vm was full.  She states they will keep trying to contact the pt.

## 2016-03-22 ENCOUNTER — Ambulatory Visit: Payer: Managed Care, Other (non HMO) | Admitting: Internal Medicine

## 2016-03-22 ENCOUNTER — Encounter: Payer: Managed Care, Other (non HMO) | Admitting: Internal Medicine

## 2016-03-28 ENCOUNTER — Encounter: Payer: Managed Care, Other (non HMO) | Attending: Internal Medicine | Admitting: Respiratory Therapy

## 2016-03-28 VITALS — Ht 63.5 in | Wt 147.7 lb

## 2016-03-28 DIAGNOSIS — D869 Sarcoidosis, unspecified: Secondary | ICD-10-CM

## 2016-03-28 DIAGNOSIS — J849 Interstitial pulmonary disease, unspecified: Secondary | ICD-10-CM | POA: Diagnosis not present

## 2016-03-28 NOTE — Patient Instructions (Signed)
Patient Instructions  Patient Details  Name: Madison Holt MRN: [id number] Date of Birth: [birth year] Referring Provider:  [name], MD  Below are the personal goals you chose as well as exercise and nutrition goals. Our goal is to help you keep on track towards obtaining and maintaining your goals. We will be discussing your progress on these goals with you throughout the program.  Initial Exercise Prescription:     Initial Exercise Prescription - 03/28/16 1600      Date of Initial Exercise RX and Referring Provider   Date 03/28/16   Referring Provider [name] MD     Treadmill   MPH 2.5   Grade 0.5   Minutes 15   METs 3.09     Recumbant Elliptical   Level 2   RPM 50   Minutes 15   METs 2     T5 Nustep   Level 3   Watts --  60-80 spm   Minutes 15   METs 3     Prescription Details   Frequency (times per week) 3   Duration Progress to 45 minutes of aerobic exercise without signs/symptoms of physical distress     Intensity   THRR 40-80% of Max Heartrate 113-160   Ratings of Perceived Exertion 11-13   Perceived Dyspnea 0-4     Progression   Progression Continue to progress workloads to maintain intensity without signs/symptoms of physical distress.     Resistance Training   Training Prescription Yes   Weight 3 lbs   Reps 10-15      Exercise Goals: Frequency: Be able to perform aerobic exercise three times per week working toward 3-5 days per week.  Intensity: Work with a perceived exertion of 11 (fairly light) - 15 (hard) as tolerated. Follow your new exercise prescription and watch for changes in prescription as you progress with the program. Changes will be reviewed with you when they are made.  Duration: You should be able to do 30 minutes of continuous aerobic exercise in addition to a 5 minute warm-up and a 5 minute cool-down routine.  Nutrition Goals: Your personal nutrition goals will be established when you do your nutrition  analysis with the dietician.  The following are nutrition guidelines to follow: Cholesterol < 200mg /day Sodium < 1500mg /day Fiber: Women under 50 yrs - 25 grams per day  Personal Goals:     Personal Goals and Risk Factors at Admission - 03/28/16 1558      Core Components/Risk Factors/Patient Goals on Admission    Weight Management Yes;Weight Loss   Intervention Weight Management: Develop a combined nutrition and exercise program designed to reach desired caloric intake, while maintaining appropriate intake of nutrient and fiber, sodium and fats, and appropriate energy expenditure required for the weight goal.;Weight Management: Provide education and appropriate resources to help participant work on and attain dietary goals.   Admit Weight 147 lb 11.2 oz (67 kg)   Goal Weight: Short Term 142 lb (64.4 kg)   Goal Weight: Long Term 120 lb (54.4 kg)   Expected Outcomes Short Term: Continue to assess and modify interventions until short term weight is achieved;Long Term: Adherence to nutrition and physical activity/exercise program aimed toward attainment of established weight goal;Weight Maintenance: Understanding of the daily nutrition guidelines, which includes 25-35% calories from fat, 7% or less cal from saturated fats, less than 200mg  cholesterol, less than 1.5gm of sodium, & 5 or more servings of fruits and vegetables daily;Weight Loss: Understanding of general recommendations for a  balanced deficit meal plan, which promotes 1-2 lb weight loss per week and includes a negative energy balance of [phone] kcal/d;Understanding recommendations for meals to include 15-35% energy as protein, 25-35% energy from fat, 35-60% energy from carbohydrates, less than 200mg  of dietary cholesterol, 20-35 gm of total fiber daily;Understanding of distribution of calorie intake throughout the day with the consumption of 4-5 meals/snacks   Sedentary Yes  Goal: be able to run, play sports, be active with her children    Intervention Provide advice, education, support and counseling about physical activity/exercise needs.;Develop an individualized exercise prescription for aerobic and resistive training based on initial evaluation findings, risk stratification, comorbidities and participant's personal goals.   Expected Outcomes Achievement of increased cardiorespiratory fitness and enhanced flexibility, muscular endurance and strength shown through measurements of functional capacity and personal statement of participant.   Increase Strength and Stamina Yes   Intervention Provide advice, education, support and counseling about physical activity/exercise needs.;Develop an individualized exercise prescription for aerobic and resistive training based on initial evaluation findings, risk stratification, comorbidities and participant's personal goals.   Expected Outcomes Achievement of increased cardiorespiratory fitness and enhanced flexibility, muscular endurance and strength shown through measurements of functional capacity and personal statement of participant.   Improve shortness of breath with ADL's Yes   Intervention Provide education, individualized exercise plan and daily activity instruction to help decrease symptoms of SOB with activities of daily living.   Expected Outcomes Short Term: Achieves a reduction of symptoms when performing activities of daily living.   Develop more efficient breathing techniques such as purse lipped breathing and diaphragmatic breathing; and practicing self-pacing with activity Yes   Intervention Provide education, demonstration and support about specific breathing techniuqes utilized for more efficient breathing. Include techniques such as pursed lipped breathing, diaphragmatic breathing and self-pacing activity.   Expected Outcomes Short Term: Participant will be able to demonstrate and use breathing techniques as needed throughout daily activities.   Increase knowledge of  respiratory medications and ability to use respiratory devices properly  Yes  Qvar; spacer given with instructions.   Intervention Provide education and demonstration as needed of appropriate use of medications, inhalers, and oxygen therapy.   Expected Outcomes Short Term: Achieves understanding of medications use. Understands that oxygen is a medication prescribed by physician. Demonstrates appropriate use of inhaler and oxygen therapy.      Tobacco Use Initial Evaluation: History  Smoking Status   Former Smoker   Packs/day: 1.00   Years: 5.00   Types: Cigarettes   Quit date: 03/07/1998  Smokeless Tobacco   Never Used    Copy of goals given to participant.

## 2016-03-28 NOTE — Progress Notes (Signed)
Pulmonary Individual Treatment Plan  Patient Details  Name: Madison Holt MRN: [id number] Date of Birth: [birth year] Referring Provider:   Flowsheet Row Pulmonary Rehab from 03/28/2016 in [facility] Cardiac and Pulmonary Rehab  Referring Provider  [name] MD      Initial Encounter Date:  Flowsheet Row Pulmonary Rehab from 03/28/2016 in [facility] Cardiac and Pulmonary Rehab  Date  03/28/16  Referring Provider  [name] MD      Visit Diagnosis: Sarcoidosis [place] [facility])  Patient's Home Medications on Admission:  Current Outpatient Prescriptions:    acetaminophen (TYLENOL) 325 MG tablet, Take 650 mg by mouth every 6 (six) hours as needed for mild pain or headache. , Disp: , Rfl:    Cholecalciferol (VITAMIN D3) 50000 units CAPS, Take 50,000 Units by mouth once a week., Disp: 4 capsule, Rfl: 2   Fluticasone-Salmeterol (ADVAIR DISKUS) 100-50 MCG/DOSE AEPB, Inhale 1 puff into the lungs 2 (two) times daily., Disp: , Rfl:    folic acid (FOLVITE) 1 MG tablet, Take 1 tablet (1 mg total) by mouth daily., Disp: 30 tablet, Rfl: 5   methotrexate (RHEUMATREX) 2.5 MG tablet, Take 3 tablets (7.5 mg total) by mouth once a week. Caution:Chemotherapy. Protect from light., Disp: 12 tablet, Rfl: 5  Past Medical History: Past Medical History:  Diagnosis Date   Pneumonia 2014   Hosp x 1 week   Shortness of breath dyspnea     Tobacco Use: History  Smoking Status   Former Smoker   Packs/day: 1.00   Years: 5.00   Types: Cigarettes   Quit date: 03/07/1998  Smokeless Tobacco   Never Used    Labs: Recent Review [facility] for [facility] Cardiac and Pulmonary Rehab Latest Ref Rng & Units 04/10/2015   Cholestrol 0 - 200 mg/dL 782   LDLCALC 0 - 99 mg/dL 51   HDL >95.62 mg/dL 13.08   Trlycerides 0.0 - 149.0 mg/dL 65.7       ADL UCSD:     Pulmonary Assessment Scores    Row Name 03/28/16 1554         ADL UCSD   ADL Phase Entry     SOB Score total 52     Rest 2      Walk 3     Stairs 4     Bath 2     Dress 2     Shop 2        Pulmonary Function Assessment:     Pulmonary Function Assessment - 03/28/16 1552      Initial Spirometry Results   FVC% 103 %   FEV1% 87 %   FEV1/FVC Ratio 70   Comments Test date 05/03/16     Post Bronchodilator Spirometry Results   FVC% 104 %   FEV1% 95 %   FEV1/FVC Ratio 76     Breath   Bilateral Breath Sounds Clear   Shortness of Breath Yes;Limiting activity;Fear of Shortness of Breath      Exercise Target Goals: Date: 03/28/16  Exercise Program Goal: Individual exercise prescription set with THRR, safety & activity barriers. Participant demonstrates ability to understand and report RPE using BORG scale, to self-measure pulse accurately, and to acknowledge the importance of the exercise prescription.  Exercise Prescription Goal: Starting with aerobic activity 30 plus minutes a day, 3 days per week for initial exercise prescription. Provide home exercise prescription and guidelines that participant acknowledges understanding prior to discharge.  Activity Barriers & Risk Stratification:     Activity  Barriers & Cardiac Risk Stratification - 03/28/16 1552      Activity Barriers & Cardiac Risk Stratification   Activity Barriers Shortness of Breath;Deconditioning   Cardiac Risk Stratification Moderate      6 Minute Walk:     6 Minute Walk    Row Name 03/28/16 1621         6 Minute Walk   Phase Initial     Distance 965 feet     Walk Time 4.31 minutes     # of Rest Breaks 1  1:41      MPH 2.54     METS 4.21     RPE 17     Perceived Dyspnea  7     VO2 Peak 14.75     Symptoms Yes (comment)     Comments dizzy, SOB, tingling in bilateral fingers, hands, and feet, legs wobbly, chest pressure "like someone in sitting on my chest"     Resting HR 66 bpm     Resting BP 106/66     Max Ex. HR 112 bpm     Max Ex. BP 126/66     2 Minute Post BP 126/64       Interval HR   Baseline HR 66     1  Minute HR 85     2 Minute HR 91     4 Minute HR 109     5 Minute HR 112     6 Minute HR 103     2 Minute Post HR 85     Interval Heart Rate? Yes       Interval Oxygen   Interval Oxygen? Yes     Baseline Oxygen Saturation % 96 %     Baseline Liters of Oxygen 0 L  Room Air     1 Minute Oxygen Saturation % 95 %     1 Minute Liters of Oxygen 0 L     2 Minute Oxygen Saturation % 88 %  At 2:54 86%     2 Minute Liters of Oxygen 0 L     3 Minute Oxygen Saturation % 92 %  Rest at 3:51 85% with quick recovery to 99%     3 Minute Liters of Oxygen 0 L     4 Minute Oxygen Saturation % 99 %     4 Minute Liters of Oxygen 0 L     5 Minute Oxygen Saturation % 100 %     5 Minute Liters of Oxygen 0 L     6 Minute Oxygen Saturation % 100 %     6 Minute Liters of Oxygen 0 L     2 Minute Post Oxygen Saturation % 99 %     2 Minute Post Liters of Oxygen 0 L        Initial Exercise Prescription:     Initial Exercise Prescription - 03/28/16 1600      Date of Initial Exercise RX and Referring Provider   Date 03/28/16   Referring Provider [name] MD     Treadmill   MPH 2.5   Grade 0.5   Minutes 15   METs 3.09     Recumbant Elliptical   Level 2   RPM 50   Minutes 15   METs 2     T5 Nustep   Level 3   Watts --  60-80 spm   Minutes 15   METs 3     Prescription Details   Frequency (  times per week) 3   Duration Progress to 45 minutes of aerobic exercise without signs/symptoms of physical distress     Intensity   THRR 40-80% of Max Heartrate 113-160   Ratings of Perceived Exertion 11-13   Perceived Dyspnea 0-4     Progression   Progression Continue to progress workloads to maintain intensity without signs/symptoms of physical distress.     Resistance Training   Training Prescription Yes   Weight 3 lbs   Reps 10-15      Perform Capillary Blood Glucose checks as needed.  Exercise Prescription Changes:   Exercise Comments:   Discharge Exercise Prescription  (Final Exercise Prescription Changes):    Nutrition:  Target Goals: Understanding of nutrition guidelines, daily intake of sodium 1500mg , cholesterol 200mg , calories 30% from fat and 7% or less from saturated fats, daily to have 5 or more servings of fruits and vegetables.  Biometrics:     Pre Biometrics - 03/28/16 1629      Pre Biometrics   Height 5' 3.5" (1.613 m)   Weight 147 lb 11.2 oz (67 kg)   Waist Circumference 32 inches   Hip Circumference 41 inches   Waist to Hip Ratio 0.78 %   BMI (Calculated) 25.8       Nutrition Therapy Plan and Nutrition Goals:   Nutrition Discharge: Rate Your Plate Scores:   Psychosocial: Target Goals: Acknowledge presence or absence of depression, maximize coping skills, provide positive support system. Participant is able to verbalize types and ability to use techniques and skills needed for reducing stress and depression.  Initial Review & Psychosocial Screening:     Initial Psych Review & Screening - 03/28/16 1601      Family Dynamics   Good Support System? Yes   Comments Ms Whittinghill has good support from her family and friends. She states she is not depressed, but is very frustrated with her shortness of breath and limited activity. Ms Widrig is looking forward to [facility] to help her achieve her goals of improved breathing and increased activity.     Screening Interventions   Interventions Encouraged to exercise;Program counselor consult      Quality of Life Scores:     Quality of Life - 03/28/16 1557      Quality of Life Scores   Health/Function Pre 21 %   Socioeconomic Pre 20.88 %   Psych/Spiritual Pre 21 %   Family Pre 21 %   GLOBAL Pre 20.97 %      PHQ-9: Recent Review Flowsheet Data    Depression screen [facility] 2/9 03/28/2016   Decreased Interest 2   Down, Depressed, Hopeless 2   PHQ - 2 Score 4   Altered sleeping 3   Tired, decreased energy 3   Change in appetite 0   Feeling bad or failure about yourself  2    Trouble concentrating 0   Moving slowly or fidgety/restless 0   Suicidal thoughts 0   PHQ-9 Score 12   Difficult doing work/chores Somewhat difficult      Psychosocial Evaluation and Intervention:   Psychosocial Re-Evaluation:  Education: Education Goals: Education classes will be provided on a weekly basis, covering required topics. Participant will state understanding/return demonstration of topics presented.  Learning Barriers/Preferences:     Learning Barriers/Preferences - 03/28/16 1552      Learning Barriers/Preferences   Learning Barriers None   Learning Preferences None      Education Topics: Initial Evaluation Education: - Verbal, written and demonstration of respiratory meds,  RPE/PD scales, oximetry and breathing techniques. Instruction on use of nebulizers and MDIs: cleaning and proper use, rinsing mouth with steroid doses and importance of monitoring MDI activations. Flowsheet Row Pulmonary Rehab from 03/28/2016 in [facility] Cardiac and Pulmonary Rehab  Date  03/28/16  Educator  LB  Instruction Review Code  2- meets goals/outcomes      General Nutrition Guidelines/Fats and Fiber: -Group instruction provided by verbal, written material, models and posters to present the general guidelines for heart healthy nutrition. Gives an explanation and review of dietary fats and fiber.   Controlling Sodium/Reading Food Labels: -Group verbal and written material supporting the discussion of sodium use in heart healthy nutrition. Review and explanation with models, verbal and written materials for utilization of the food label.   Exercise Physiology & Risk Factors: - Group verbal and written instruction with models to review the exercise physiology of the cardiovascular system and associated critical values. Details cardiovascular disease risk factors and the goals associated with each risk factor.   Aerobic Exercise & Resistance Training: - Gives group verbal and written  discussion on the health impact of inactivity. On the components of aerobic and resistive training programs and the benefits of this training and how to safely progress through these programs.   Flexibility, Balance, General Exercise Guidelines: - Provides group verbal and written instruction on the benefits of flexibility and balance training programs. Provides general exercise guidelines with specific guidelines to those with heart or lung disease. Demonstration and skill practice provided.   Stress Management: - Provides group verbal and written instruction about the health risks of elevated stress, cause of high stress, and healthy ways to reduce stress.   Depression: - Provides group verbal and written instruction on the correlation between heart/lung disease and depressed mood, treatment options, and the stigmas associated with seeking treatment.   Exercise & Equipment Safety: - Individual verbal instruction and demonstration of equipment use and safety with use of the equipment.   Infection Prevention: - Provides verbal and written material to individual with discussion of infection control including proper hand washing and proper equipment cleaning during exercise session.   Falls Prevention: - Provides verbal and written material to individual with discussion of falls prevention and safety.   Diabetes: - Individual verbal and written instruction to review signs/symptoms of diabetes, desired ranges of glucose level fasting, after meals and with exercise. Advice that pre and post exercise glucose checks will be done for 3 sessions at entry of program.   Chronic Lung Diseases: - Group verbal and written instruction to review new updates, new respiratory medications, new advancements in procedures and treatments. Provide informative websites and "800" numbers of self-education.   Lung Procedures: - Group verbal and written instruction to describe testing methods done to  diagnose lung disease. Review the outcome of test results. Describe the treatment choices: Pulmonary Function Tests, ABGs and oximetry.   Energy Conservation: - Provide group verbal and written instruction for methods to conserve energy, plan and organize activities. Instruct on pacing techniques, use of adaptive equipment and posture/positioning to relieve shortness of breath.   Triggers: - Group verbal and written instruction to review types of environmental controls: home humidity, furnaces, filters, dust mite/pet prevention, HEPA vacuums. To discuss weather changes, air quality and the benefits of nasal washing.   Exacerbations: - Group verbal and written instruction to provide: warning signs, infection symptoms, calling MD promptly, preventive modes, and value of vaccinations. Review: effective airway clearance, coughing and/or vibration techniques. Create an Action  Plan.   Oxygen: - Individual and group verbal and written instruction on oxygen therapy. Includes supplement oxygen, available portable oxygen systems, continuous and intermittent flow rates, oxygen safety, concentrators, and Medicare reimbursement for oxygen.   Respiratory Medications: - Group verbal and written instruction to review medications for lung disease. Drug class, frequency, complications, importance of spacers, rinsing mouth after steroid MDI's, and proper cleaning methods for nebulizers. Flowsheet Row [facility]  Date  03/28/16  Educator  LB  Instruction Review Code  2- meets goals/outcomes      AED/CPR: - Group verbal and written instruction with the use of models to demonstrate the basic use of the AED with the basic ABC's of resuscitation.   Breathing Retraining: - Provides individuals verbal and written instruction on purpose, frequency, and proper technique of diaphragmatic breathing and pursed-lipped breathing. Applies individual practice  skills. Flowsheet [facility]  Date  03/28/16  Educator  LB  Instruction Review Code  2- meets goals/outcomes      Anatomy and Physiology of the Lungs: - Group verbal and written instruction with the use of models to provide basic lung anatomy and physiology related to function, structure and complications of lung disease.   Heart Failure: - Group verbal and written instruction on the basics of heart failure: signs/symptoms, treatments, explanation of ejection fraction, enlarged heart and cardiomyopathy.   Sleep Apnea: - Individual verbal and written instruction to review Obstructive Sleep Apnea. Review of risk factors, methods for diagnosing and types of masks and machines for OSA.   Anxiety: - Provides group, verbal and written instruction on the correlation between heart/lung disease and anxiety, treatment options, and management of anxiety.   Relaxation: - Provides group, verbal and written instruction about the benefits of relaxation for patients with heart/lung disease. Also provides patients with examples of relaxation techniques.   Knowledge Questionnaire Score:     Knowledge Questionnaire Score - 03/28/16 1552      Knowledge Questionnaire Score   Pre Score 5/10       Core Components/Risk Factors/Patient Goals at Admission:     Personal Goals and Risk Factors at Admission - 03/28/16 1558      Core Components/Risk Factors/Patient Goals on Admission    Weight Management Yes;Weight Loss   Intervention Weight Management: Develop a combined nutrition and exercise program designed to reach desired caloric intake, while maintaining appropriate intake of nutrient and fiber, sodium and fats, and appropriate energy expenditure required for the weight goal.;Weight Management: Provide education and appropriate resources to help participant work on and attain dietary goals.   Admit Weight 147 lb 11.2 oz (67 kg)    Goal Weight: Short Term 142 lb (64.4 kg)   Goal Weight: Long Term 120 lb (54.4 kg)   Expected Outcomes Short Term: Continue to assess and modify interventions until short term weight is achieved;Long Term: Adherence to nutrition and physical activity/exercise program aimed toward attainment of established weight goal;Weight Maintenance: Understanding of the daily nutrition guidelines, which includes 25-35% calories from fat, 7% or less cal from saturated fats, less than 200mg  cholesterol, less than 1.5gm of sodium, & 5 or more servings of fruits and vegetables daily;Weight Loss: Understanding of general recommendations for a balanced deficit meal plan, which promotes 1-2 lb weight loss per week and includes a negative energy balance of [phone] kcal/d;Understanding recommendations for meals to include 15-35% energy as protein, 25-35% energy from fat, 35-60%  energy from carbohydrates, less than 200mg  of dietary cholesterol, 20-35 gm of total fiber daily;Understanding of distribution of calorie intake throughout the day with the consumption of 4-5 meals/snacks   Sedentary Yes  Goal: be able to run, play sports, be active with her children   Intervention Provide advice, education, support and counseling about physical activity/exercise needs.;Develop an individualized exercise prescription for aerobic and resistive training based on initial evaluation findings, risk stratification, comorbidities and participant's personal goals.   Expected Outcomes Achievement of increased cardiorespiratory fitness and enhanced flexibility, muscular endurance and strength shown through measurements of functional capacity and personal statement of participant.   Increase Strength and Stamina Yes   Intervention Provide advice, education, support and counseling about physical activity/exercise needs.;Develop an individualized exercise prescription for aerobic and resistive training based on initial evaluation findings, risk  stratification, comorbidities and participant's personal goals.   Expected Outcomes Achievement of increased cardiorespiratory fitness and enhanced flexibility, muscular endurance and strength shown through measurements of functional capacity and personal statement of participant.   Improve shortness of breath with ADL's Yes   Intervention Provide education, individualized exercise plan and daily activity instruction to help decrease symptoms of SOB with activities of daily living.   Expected Outcomes Short Term: Achieves a reduction of symptoms when performing activities of daily living.   Develop more efficient breathing techniques such as purse lipped breathing and diaphragmatic breathing; and practicing self-pacing with activity Yes   Intervention Provide education, demonstration and support about specific breathing techniuqes utilized for more efficient breathing. Include techniques such as pursed lipped breathing, diaphragmatic breathing and self-pacing activity.   Expected Outcomes Short Term: Participant will be able to demonstrate and use breathing techniques as needed throughout daily activities.   Increase knowledge of respiratory medications and ability to use respiratory devices properly  Yes  Qvar; spacer given with instructions.   Intervention Provide education and demonstration as needed of appropriate use of medications, inhalers, and oxygen therapy.   Expected Outcomes Short Term: Achieves understanding of medications use. Understands that oxygen is a medication prescribed by physician. Demonstrates appropriate use of inhaler and oxygen therapy.      Core Components/Risk Factors/Patient Goals Review:    Core Components/Risk Factors/Patient Goals at Discharge (Final Review):    ITP Comments:   Comments: Ms Kirn plans to start [facility] on 04/04/16 and attend 3 days/week.

## 2016-03-29 ENCOUNTER — Ambulatory Visit (INDEPENDENT_AMBULATORY_CARE_PROVIDER_SITE_OTHER): Payer: Managed Care, Other (non HMO) | Admitting: Internal Medicine

## 2016-03-29 ENCOUNTER — Ambulatory Visit (INDEPENDENT_AMBULATORY_CARE_PROVIDER_SITE_OTHER): Payer: Managed Care, Other (non HMO) | Admitting: Adult Health

## 2016-03-29 ENCOUNTER — Encounter: Payer: Self-pay | Admitting: Adult Health

## 2016-03-29 VITALS — BP 110/64 | HR 90 | Temp 97.9°F | Ht 64.0 in | Wt 147.8 lb

## 2016-03-29 DIAGNOSIS — D860 Sarcoidosis of lung: Secondary | ICD-10-CM

## 2016-03-29 DIAGNOSIS — J849 Interstitial pulmonary disease, unspecified: Secondary | ICD-10-CM

## 2016-03-29 LAB — PULMONARY FUNCTION TEST
DL/VA % PRED: 99 %
DL/VA: 4.79 ml/min/mmHg/L
DLCO COR: 20.49 ml/min/mmHg
DLCO cor % pred: 84 %
DLCO unc % pred: 89 %
DLCO unc: 21.71 ml/min/mmHg
FEF 25-75 PRE: 1.77 L/s
FEF 25-75 Post: 2.46 L/sec
FEF2575-%Change-Post: 39 %
FEF2575-%PRED-PRE: 54 %
FEF2575-%Pred-Post: 76 %
FEV1-%Change-Post: 9 %
FEV1-%PRED-PRE: 83 %
FEV1-%Pred-Post: 91 %
FEV1-POST: 2.82 L
FEV1-PRE: 2.57 L
FEV1FVC-%Change-Post: 7 %
FEV1FVC-%Pred-Pre: 83 %
FEV6-%CHANGE-POST: 2 %
FEV6-%Pred-Post: 102 %
FEV6-%Pred-Pre: 100 %
FEV6-POST: 3.78 L
FEV6-PRE: 3.68 L
FEV6FVC-%Change-Post: 0 %
FEV6FVC-%PRED-POST: 102 %
FEV6FVC-%PRED-PRE: 101 %
FVC-%Change-Post: 1 %
FVC-%PRED-POST: 101 %
FVC-%PRED-PRE: 99 %
FVC-POST: 3.78 L
FVC-Pre: 3.71 L
POST FEV6/FVC RATIO: 100 %
PRE FEV1/FVC RATIO: 69 %
Post FEV1/FVC ratio: 75 %
Pre FEV6/FVC Ratio: 99 %
RV % PRED: 105 %
RV: 1.61 L
TLC % PRED: 102 %
TLC: 5.19 L

## 2016-03-29 NOTE — Progress Notes (Signed)
 @Patient  ID: Madison BushmanJennifer L Iafrate, female    DOB: 06-08-1980, 36 y.o.   MRN: [id number]  Chief Complaint  Patient presents with  . Follow-up    Sarcoid     Referring provider: Hannah Beatopland, [place], MD  HPI: 36 yo female followed for Sarcoid   TEST  Pulmonary function tests of 05/04/2015 normal  Autoimmune workup 05/06/2015 extensive panel is essentially normal except angiotensin-converting enzyme is elevated at 60 and c-ANCA is trace positive at 1:40 but PR 3 and MPO antibodies are negative.  03/29/16 Follow up : Sarcoid  Pt returns for 3 month follow up for Sarcoid . She remains on Methotrexate.  She is also on QVAR Twice daily  . No flare of cough or dyspnea.  She is due for labs today .  She has noticed spot along inner thighs/perineal area that is hyperpigmented. No other skin rash.  We discussed going to her dermatologist for evaluation .  She is going to pulmonary rehab.  She does get occasional chest tightness that has been going on for months. She denies syncope or palpitations. 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     Allergies  Allergen Reactions  . Codeine Other (See Comments)    Other reaction(s): Hallucination    Immunization History  Administered Date(s) Administered  . Influenza,inj,Quad PF,36+ Mos 12/21/2015    Past Medical History:  Diagnosis Date  . Pneumonia 2014   Hosp x 1 week  . Shortness of breath dyspnea     Tobacco History: History  Smoking Status  . Former Smoker  . Packs/day: 1.00  . Years: 5.00  . Types: Cigarettes  . Quit date: 03/07/1998  Smokeless Tobacco  . Never Used   Counseling given: Not Answered   Outpatient Encounter Prescriptions as of 03/29/2016  Medication Sig  . acetaminophen (TYLENOL) 325 MG tablet Take 650 mg by mouth every 6 (six) hours as needed for mild pain or headache.   . beclomethasone (QVAR) 40 MCG/ACT inhaler Inhale 2 puffs into the lungs 2 (two) times daily.  .  Cholecalciferol (VITAMIN D3) 50000 units CAPS Take 50,000 Units by mouth once a week.  . folic acid (FOLVITE) 1 MG tablet Take 1 tablet (1 mg total) by mouth daily.  . methotrexate (RHEUMATREX) 2.5 MG tablet Take 3 tablets (7.5 mg total) by mouth once a week. Caution:Chemotherapy. Protect from light. (Patient taking differently: Take 5 mg by mouth once a week. Caution:Chemotherapy. Protect from light.)  . [DISCONTINUED] Fluticasone-Salmeterol (ADVAIR DISKUS) 100-50 MCG/DOSE AEPB Inhale 1 puff into the lungs 2 (two) times daily.   No facility-administered encounter medications on file as of 03/29/2016.      Review of Systems  Constitutional:   No  weight loss, night sweats,  Fevers, chills,  +fatigue, or  lassitude.  HEENT:   No headaches,  Difficulty swallowing,  Tooth/dental problems, or  Sore throat,                No sneezing, itching, ear ache, nasal congestion, post nasal drip,   CV:  No chest pain,  Orthopnea, PND, swelling in lower extremities, anasarca, dizziness, palpitations, syncope.   GI  No heartburn, indigestion, abdominal pain, nausea, vomiting, diarrhea, change in bowel habits, loss of appetite, bloody stools.   Resp:   No excess mucus, no productive cough,  No non-productive cough,  No coughing up of blood.  No change in color of mucus.  No wheezing.  No chest wall deformity  Skin: no rash or lesions.  GU: no dysuria, change in color of urine, no urgency or frequency.  No flank pain, no hematuria   MS:  No joint pain or swelling.  No decreased range of motion.  No back pain.    Physical Exam  BP 110/64   Pulse 90   Temp 97.9 F (36.6 C) (Oral)   Ht 5\' 4"  (1.626 m)   Wt 147 lb 12.8 oz (67 kg)   SpO2 98%   BMI 25.37 kg/m   GEN: A/Ox3; pleasant , NAD, well nourished    HEENT:  [place]/AT,  EACs-clear, TMs-wnl, NOSE-clear, THROAT-clear, no lesions, no postnasal drip or exudate noted.   NECK:  Supple w/ fair ROM; no JVD; normal carotid impulses w/o bruits; no  thyromegaly or nodules palpated; no lymphadenopathy.    RESP  Clear  P & A; w/o, wheezes/ rales/ or rhonchi. no accessory muscle use, no dullness to percussion  CARD:  RRR, no m/r/g, no peripheral edema, pulses intact, no cyanosis or clubbing.  GI:   Soft & nt; nml bowel sounds; no organomegaly or masses detected.   Musco: Warm bil, no deformities or joint swelling noted.   Neuro: alert, no focal deficits noted.    Skin: Warm, no lesions or rashes  Psych:  No change in mood or affect. No depression or anxiety.  No memory loss.  Lab Results:  CBC  BMET  BNP No results found for: BNP  ProBNP No results found for: PROBNP  Imaging: No results found.   Assessment & Plan:   No problem-specific Assessment & Plan notes found for this encounter.     Rubye Oaks, NP 03/29/2016

## 2016-03-29 NOTE — Patient Instructions (Signed)
Follow up with Dermatology for skin lession  Follow up with  Cardiology at your office as discussed.  Continue on Methotrexate .  Labs today .  Continue on QVAR 2 puffs Twice daily   Continue with pulmonary rehab.  follow up Dr. Marchelle Gearingamaswamy in 3 months and As needed

## 2016-04-04 DIAGNOSIS — D869 Sarcoidosis, unspecified: Secondary | ICD-10-CM

## 2016-04-04 DIAGNOSIS — J849 Interstitial pulmonary disease, unspecified: Secondary | ICD-10-CM | POA: Diagnosis not present

## 2016-04-04 NOTE — Progress Notes (Signed)
Daily Session Note  Patient Details  Name: LATREECE MOCHIZUKI MRN: [id number] Date of Birth: 02-Nov-1980 Referring Provider:   Flowsheet Row Pulmonary Rehab from 03/28/2016 in [facility] Card[facility] and [facility]  Referring Provider  Brand Males MD      Encounter Date: 04/04/2016  Check In:     Session Check In - 04/04/16 1259      Check-In   Location [facility]-Cardiac & Pulmonary Rehab   Staff Present Carson Myrtle, BS, RRT, Respiratory Therapist;Kelly Amedeo Plenty, BS, ACSM CEP, Exercise Physiologist;Darthula Desa Oletta Darter, BA, ACSM CEP, Exercise Physiologist   Supervising physician immediately available to respond to emergencies [facility] immediately available ER MD   Physician(s) Corky Downs and Paduchowski   Medication changes reported     No   Fall or balance concerns reported    No   Warm-up and Cool-down Performed as group-led instruction   Resistance Training Performed Yes   VAD Patient? No           Exercise Prescription Changes - 04/04/16 1300      Response to Exercise   Blood Pressure (Admit) 112/78   Blood Pressure (Exercise) 110/74   Blood Pressure (Exit) 118/60   Heart Rate (Admit) 82 bpm   Heart Rate (Exercise) 116 bpm   Heart Rate (Exit) 87 bpm   Oxygen Saturation (Admit) 96 %   Oxygen Saturation (Exercise) 97 %   Oxygen Saturation (Exit) 98 %   Rating of Perceived Exertion (Exercise) 14   Perceived Dyspnea (Exercise) 3   Duration Progress to 45 minutes of aerobic exercise without signs/symptoms of physical distress   Intensity THRR unchanged     Progression   Progression Continue to progress workloads to maintain intensity without signs/symptoms of physical distress.     Resistance Training   Training Prescription Yes   Weight 3   Reps 10-15     Interval Training   Interval Training No     Treadmill   MPH 2.5   Grade 0.5   Minutes 15   METs 3.09     Recumbant Elliptical   Level 2   RPM 50   Minutes 15   METs 1.4     T5 Nustep   Level 3   Minutes 15       Goals Met:  Proper associated with RPD/PD & O2 Sat Independence with exercise equipment Exercise tolerated well Strength training completed today  Goals Unmet:  Not Applicable  Comments: First full day of exercise!  Patient was oriented to gym and equipment including functions, settings, policies, and procedures.  Patient's individual exercise prescription and treatment plan were reviewed.  All starting workloads were established based on the results of the 6 minute walk test done at initial orientation visit.  The plan for exercise progression was also introduced and progression will be customized based on patient's performance and goals.    Dr. Emily Filbert is Medical Director for [place] and [facility].

## 2016-04-06 ENCOUNTER — Encounter: Payer: Self-pay | Admitting: *Deleted

## 2016-04-06 ENCOUNTER — Encounter: Payer: Managed Care, Other (non HMO) | Admitting: *Deleted

## 2016-04-06 DIAGNOSIS — D869 Sarcoidosis, unspecified: Secondary | ICD-10-CM

## 2016-04-06 DIAGNOSIS — J849 Interstitial pulmonary disease, unspecified: Secondary | ICD-10-CM | POA: Diagnosis not present

## 2016-04-06 NOTE — Progress Notes (Signed)
Daily Session Note  Patient Details  Name: Madison Holt MRN: [id number] Date of Birth: 08/25/1980 Referring Provider:   Flowsheet Row Pulmonary Rehab from 03/28/2016 in [facility] Cardiac and Pulmonary Rehab  Referring Provider  Brand Males MD      Encounter Date: 04/06/2016  Check In:     Session Check In - 04/06/16 1219      Check-In   Location [facility]-Cardiac & Pulmonary Rehab   Staff Present Carson Myrtle, BS, RRT, Respiratory Lennie Hummer, MA, ACSM RCEP, Exercise Physiologist;Koi Zangara RN BSN   Supervising physician immediately available to respond to emergencies [facility] immediately available ER MD   Physician(s) Drs. Malinda and Williams   Medication changes reported     No   Fall or balance concerns reported    No   Warm-up and Cool-down Performed as group-led Location manager Performed Yes   VAD Patient? No     Pain Assessment   Currently in Pain? No/denies   Multiple Pain Sites No         Goals Met:  Proper associated with RPD/PD & O2 Sat Independence with exercise equipment Using PLB without cueing & demonstrates good technique Exercise tolerated well Strength training completed today  Goals Unmet:  Not Applicable  Comments:  Pt able to follow exercise prescription today without complaint.  Will continue to monitor for progression.  Reviewed home exercises with Madison Holt today. She plans to walk at home for exercise in addition to using her home gym . Also discussed RPE, pulse, THR, restrictions, s/sx when to call MD, and weather considerations. She verbalized understanding.     Dr. Emily Filbert is Medical Director for [place] and [facility].

## 2016-04-06 NOTE — Progress Notes (Signed)
Pulmonary Individual Treatment Plan  Patient Details 2 Name: Madison Holt MRN: [id number] Date of Birth: 06/02/1980 Referring Provider:   Flowsheet Row Pulmonary Rehab from 03/28/2016 in [facility] Cardiac and Pulmonary Rehab  Referring Provider  Brand Males MD      Initial Encounter Date:  Flowsheet Row Pulmonary Rehab from 03/28/2016 in [facility] Cardiac and Pulmonary Rehab  Date  03/28/16  Referring Provider  Brand Males MD      Visit Diagnosis: Sarcoidosis [facility])  Patient's Home Medications on Admission:  Current Outpatient Prescriptions:  .  acetaminophen (TYLENOL) 325 MG tablet, Take 650 mg by mouth every 6 (six) hours as needed for mild pain or headache. , Disp: , Rfl:  .  beclomethasone (QVAR) 40 MCG/ACT inhaler, Inhale 2 puffs into the lungs 2 (two) times daily., Disp: , Rfl:  .  Cholecalciferol (VITAMIN D3) 50000 units CAPS, Take 50,000 Units by mouth once a week., Disp: 4 capsule, Rfl: 2 .  folic acid (FOLVITE) 1 MG tablet, Take 1 tablet (1 mg total) by mouth daily., Disp: 30 tablet, Rfl: 5 .  methotrexate (RHEUMATREX) 2.5 MG tablet, Take 3 tablets (7.5 mg total) by mouth once a week. Caution:Chemotherapy. Protect from light. (Patient taking differently: Take 5 mg by mouth once a week. Caution:Chemotherapy. Protect from light.), Disp: 12 tablet, Rfl: 5  Past Medical History: Past Medical History:  Diagnosis Date  . Pneumonia 2014   Hosp x 1 week  . Shortness of breath dyspnea     Tobacco Use: History  Smoking Status  . Former Smoker  . Packs/day: 1.00  . Years: 5.00  . Types: Cigarettes  . Quit date: 03/07/1998  Smokeless Tobacco  . Never Used    Labs: Recent Review Flowsheet Data    Labs for ITP Cardiac and Pulmonary Rehab Latest Ref Rng & Units 04/10/2015   Cholestrol 0 - 200 mg/dL 132   LDLCALC 0 - 99 mg/dL 51   HDL >39.00 mg/dL 68.60   Trlycerides 0.0 - 149.0 mg/dL 63.0       ADL UCSD:     Pulmonary Assessment Scores    Row Name 03/28/16  1554         ADL UCSD   ADL Phase Entry     SOB Score total 52     Rest 2     Walk 3     Stairs 4     Bath 2     Dress 2     Shop 2        Pulmonary Function Assessment:     Pulmonary Function Assessment - 03/28/16 1552      Initial Spirometry Results   FVC% 103 %   FEV1% 87 %   FEV1/FVC Ratio 70   Comments Test date 05/03/16     Post Bronchodilator Spirometry Results   FVC% 104 %   FEV1% 95 %   FEV1/FVC Ratio 76     Breath   Bilateral Breath Sounds Clear   Shortness of Breath Yes;Limiting activity;Fear of Shortness of Breath      Exercise Target Goals:    Exercise Program Goal: Individual exercise prescription set with THRR, safety & activity barriers. Participant demonstrates ability to understand and report RPE using BORG scale, to self-measure pulse accurately, and to acknowledge the importance of the exercise prescription.  Exercise Prescription Goal: Starting with aerobic activity 30 plus minutes a day, 3 days per week for initial exercise prescription. Provide home exercise prescription and guidelines that participant acknowledges understanding  prior to discharge.  Activity Barriers & Risk Stratification:     Activity Barriers & Cardiac Risk Stratification - 03/28/16 1552      Activity Barriers & Cardiac Risk Stratification   Activity Barriers Shortness of Breath;Deconditioning   Cardiac Risk Stratification Moderate      6 Minute Walk:     6 Minute Walk    Row Name 03/28/16 1621         6 Minute Walk   Phase Initial     Distance 965 feet     Walk Time 4.31 minutes     # of Rest Breaks 1  1:41      MPH 2.54     METS 4.21     RPE 17     Perceived Dyspnea  7     VO2 Peak 14.75     Symptoms Yes (comment)     Comments dizzy, SOB, tingling in bilateral fingers, hands, and feet, legs wobbly, chest pressure "like someone in sitting on my chest"     Resting HR 66 bpm     Resting BP 106/66     Max Ex. HR 112 bpm     Max Ex. BP 126/66      2 Minute Post BP 126/64       Interval HR   Baseline HR 66     1 Minute HR 85     2 Minute HR 91     4 Minute HR 109     5 Minute HR 112     6 Minute HR 103     2 Minute Post HR 85     Interval Heart Rate? Yes       Interval Oxygen   Interval Oxygen? Yes     Baseline Oxygen Saturation % 96 %     Baseline Liters of Oxygen 0 L  Room Air     1 Minute Oxygen Saturation % 95 %     1 Minute Liters of Oxygen 0 L     2 Minute Oxygen Saturation % 88 %  At 2:54 86%     2 Minute Liters of Oxygen 0 L     3 Minute Oxygen Saturation % 92 %  Rest at 3:51 85% with quick recovery to 99%     3 Minute Liters of Oxygen 0 L     4 Minute Oxygen Saturation % 99 %     4 Minute Liters of Oxygen 0 L     5 Minute Oxygen Saturation % 100 %     5 Minute Liters of Oxygen 0 L     6 Minute Oxygen Saturation % 100 %     6 Minute Liters of Oxygen 0 L     2 Minute Post Oxygen Saturation % 99 %     2 Minute Post Liters of Oxygen 0 L        Initial Exercise Prescription:     Initial Exercise Prescription - 03/28/16 1600      Date of Initial Exercise RX and Referring Provider   Date 03/28/16   Referring Provider Brand Males MD     Treadmill   MPH 2.5   Grade 0.5   Minutes 15   METs 3.09     Recumbant Elliptical   Level 2   RPM 50   Minutes 15   METs 2     T5 Nustep   Level 3   Watts --  60-80 spm   Minutes  15   METs 3     Prescription Details   Frequency (times per week) 3   Duration Progress to 45 minutes of aerobic exercise without signs/symptoms of physical distress     Intensity   THRR 40-80% of Max Heartrate 113-160   Ratings of Perceived Exertion 11-13   Perceived Dyspnea 0-4     Progression   Progression Continue to progress workloads to maintain intensity without signs/symptoms of physical distress.     Resistance Training   Training Prescription Yes   Weight 3 lbs   Reps 10-15      Perform Capillary Blood Glucose checks as needed.  Exercise Prescription  Changes:     Exercise Prescription Changes    Row Name 04/04/16 1300 04/06/16 1200           Exercise Review   Progression  - -  Second Day of Exercise        Response to Exercise   Blood Pressure (Admit) 112/78 110/70      Blood Pressure (Exercise) 110/74 90/68      Blood Pressure (Exit) 118/60 100/60      Heart Rate (Admit) 82 bpm 88 bpm      Heart Rate (Exercise) 116 bpm 116 bpm      Heart Rate (Exit) 87 bpm 106 bpm      Oxygen Saturation (Admit) 96 % 99 %      Oxygen Saturation (Exercise) 97 % 97 %      Oxygen Saturation (Exit) 98 % 98 %      Rating of Perceived Exertion (Exercise) 14 13      Perceived Dyspnea (Exercise) 3 4      Symptoms  - SOB on treadmill      Comments  - Home Exercise Guidelines given 04/06/16      Duration Progress to 45 minutes of aerobic exercise without signs/symptoms of physical distress Progress to 45 minutes of aerobic exercise without signs/symptoms of physical distress      Intensity THRR unchanged THRR unchanged        Progression   Progression Continue to progress workloads to maintain intensity without signs/symptoms of physical distress. Continue to progress workloads to maintain intensity without signs/symptoms of physical distress.      Average METs  - 2.02        Resistance Training   Training Prescription Yes Yes      Weight 3 green band      Reps 10-15 10-15        Interval Training   Interval Training No No        Treadmill   MPH 2.5 2      Grade 0.5 0.5      Minutes 15 15      METs 3.09 2.67        NuStep   Level  - 4      Minutes  - 15        Recumbant Elliptical   Level 2 2      RPM 50 50      Minutes 15 15      METs 1.4 1.6        T5 Nustep   Level 3 3      Watts  - -  58 spm      Minutes 15 15      METs  - 1.8        Home Exercise Plan   Plans to continue exercise at  -  Home  walking and using home gym      Frequency  - Add 1 additional day to program exercise sessions.         Exercise Comments:      Exercise Comments    Row Name 04/04/16 1305 04/06/16 1245 04/06/16 1410       Exercise Comments First full day of exercise!  Patient was oriented to gym and equipment including functions, settings, policies, and procedures.  Patient's individual exercise prescription and treatment plan were reviewed.  All starting workloads were established based on the results of the 6 minute walk test done at initial orientation visit.  The plan for exercise progression was also introduced and progression will be customized based on patient's performance and goals. Reviewed home exercises with Delsa Sale today. She plans to walk at home for exercise in addition to using her home gym . Also discussed RPE, pulse, THR, restrictions, s/sx when to call MD, and weather considerations. She verbalized understanding.  Today Jenn had a pack of crackers prior to exercise and it was much easier for her to exercise today.        Discharge Exercise Prescription (Final Exercise Prescription Changes):     Exercise Prescription Changes - 04/06/16 1200      Exercise Review   Progression --  Second Day of Exercise     Response to Exercise   Blood Pressure (Admit) 110/70   Blood Pressure (Exercise) 90/68   Blood Pressure (Exit) 100/60   Heart Rate (Admit) 88 bpm   Heart Rate (Exercise) 116 bpm   Heart Rate (Exit) 106 bpm   Oxygen Saturation (Admit) 99 %   Oxygen Saturation (Exercise) 97 %   Oxygen Saturation (Exit) 98 %   Rating of Perceived Exertion (Exercise) 13   Perceived Dyspnea (Exercise) 4   Symptoms SOB on treadmill   Comments Home Exercise Guidelines given 04/06/16   Duration Progress to 45 minutes of aerobic exercise without signs/symptoms of physical distress   Intensity THRR unchanged     Progression   Progression Continue to progress workloads to maintain intensity without signs/symptoms of physical distress.   Average METs 2.02     Resistance Training   Training Prescription Yes   Weight green band    Reps 10-15     Interval Training   Interval Training No     Treadmill   MPH 2   Grade 0.5   Minutes 15   METs 2.67     NuStep   Level 4   Minutes 15     Recumbant Elliptical   Level 2   RPM 50   Minutes 15   METs 1.6     T5 Nustep   Level 3   Watts --  58 spm   Minutes 15   METs 1.8     Home Exercise Plan   Plans to continue exercise at Home  walking and using home gym   Frequency Add 1 additional day to program exercise sessions.       Nutrition:  Target Goals: Understanding of nutrition guidelines, daily intake of sodium <1532m, cholesterol <2079m calories 30% from fat and 7% or less from saturated fats, daily to have 5 or more servings of fruits and vegetables.  Biometrics:     Pre Biometrics - 03/28/16 1629      Pre Biometrics   Height 5' 3.5" (1.613 m)   Weight 147 lb 11.2 oz (67 kg)   Waist Circumference 32 inches   Hip  Circumference 41 inches   Waist to Hip Ratio 0.78 %   BMI (Calculated) 25.8       Nutrition Therapy Plan and Nutrition Goals:   Nutrition Discharge: Rate Your Plate Scores:   Psychosocial: Target Goals: Acknowledge presence or absence of depression, maximize coping skills, provide positive support system. Participant is able to verbalize types and ability to use techniques and skills needed for reducing stress and depression.  Initial Review & Psychosocial Screening:     Initial Psych Review & Screening - 03/28/16 1601      Family Dynamics   Good Support System? Yes   Comments Ms Wiegert has good support from her family and friends. She states she is not depressed, but is very frustrated with her shortness of breath and limited activity. Ms Offutt is looking forward to [facility] to help her achieve her goals of improved breathing and increased activity.     Screening Interventions   Interventions Encouraged to exercise;Program counselor consult      Quality of Life Scores:     Quality of Life - 03/28/16 1557       Quality of Life Scores   Health/Function Pre 21 %   Socioeconomic Pre 20.88 %   Psych/Spiritual Pre 21 %   Family Pre 21 %   GLOBAL Pre 20.97 %      PHQ-9: Recent Review Flowsheet Data    Depression screen [facility] 2/9 03/28/2016   Decreased Interest 2   Down, Depressed, Hopeless 2   PHQ - 2 Score 4   Altered sleeping 3   Tired, decreased energy 3   Change in appetite 0   Feeling bad or failure about yourself  2   Trouble concentrating 0   Moving slowly or fidgety/restless 0   Suicidal thoughts 0   PHQ-9 Score 12   Difficult doing work/chores Somewhat difficult      Psychosocial Evaluation and Intervention:     Psychosocial Evaluation - 04/04/16 1219      Psychosocial Evaluation & Interventions   Interventions Encouraged to exercise with the program and follow exercise prescription;Relaxation education;Stress management education   Comments Counselor met with Ms. Steenson today for initial psychosocial evaluation.  She is a 36 year old who was diagnosed with Sarcoidosis almost a year ago.  She has a strong support system with lots of family close by and a significant other in the home with her.  She reports sleeping better now that she is off the steroids and her appetite has stabilized to where she can actually lose weight now.  She denies a history of depression or anxiety or current symptoms.  counselor brought to her attention that her PHQ-9 score of "12" indicates some possible depressive symptoms.  Discussed this with her stating her interrupted sleep and the stress of this diagnosis at such a young age have impacted her mood.  She is trying to determine her "new norm" and what to expect from this point on since she used to be so active in sports, etc.  Other stress in her life are parenting (84) children with her (3) boys and (2) steps there half time.  Her goals for this program are to learn how to "breathe again."  Counselor encouraged her to participate in the psychoeducational  components of this program to help with stress and relaxation.  Staff will be following with Ms. Melby throughout the course of this program.     Continued Psychosocial Services Needed Yes  Follow on mood and sleep  once exercises a few weeks to see if symptoms improve.       Psychosocial Re-Evaluation:  Education: Education Goals: Education classes will be provided on a weekly basis, covering required topics. Participant will state understanding/return demonstration of topics presented.  Learning Barriers/Preferences:     Learning Barriers/Preferences - 03/28/16 1552      Learning Barriers/Preferences   Learning Barriers None   Learning Preferences None      Education Topics: Initial Evaluation Education: - Verbal, written and demonstration of respiratory meds, RPE/PD scales, oximetry and breathing techniques. Instruction on use of nebulizers and MDIs: cleaning and proper use, rinsing mouth with steroid doses and importance of monitoring MDI activations. Flowsheet Row Pulmonary Rehab from 04/06/2016 in [facility] Cardiac and Pulmonary Rehab  Date  03/28/16  Educator  LB  Instruction Review Code  2- meets goals/outcomes      General Nutrition Guidelines/Fats and Fiber: -Group instruction provided by verbal, written material, models and posters to present the general guidelines for heart healthy nutrition. Gives an explanation and review of dietary fats and fiber.   Controlling Sodium/Reading Food Labels: -Group verbal and written material supporting the discussion of sodium use in heart healthy nutrition. Review and explanation with models, verbal and written materials for utilization of the food label.   Exercise Physiology & Risk Factors: - Group verbal and written instruction with models to review the exercise physiology of the cardiovascular system and associated critical values. Details cardiovascular disease risk factors and the goals associated with each risk  factor.   Aerobic Exercise & Resistance Training: - Gives group verbal and written discussion on the health impact of inactivity. On the components of aerobic and resistive training programs and the benefits of this training and how to safely progress through these programs. Flowsheet Row Pulmonary Rehab from 04/06/2016 in [facility] Cardiac and Pulmonary Rehab  Date  04/06/16  Educator  [facility]  Instruction Review Code  2- meets goals/outcomes      Flexibility, Balance, General Exercise Guidelines: - Provides group verbal and written instruction on the benefits of flexibility and balance training programs. Provides general exercise guidelines with specific guidelines to those with heart or lung disease. Demonstration and skill practice provided.   Stress Management: - Provides group verbal and written instruction about the health risks of elevated stress, cause of high stress, and healthy ways to reduce stress.   Depression: - Provides group verbal and written instruction on the correlation between heart/lung disease and depressed mood, treatment options, and the stigmas associated with seeking treatment.   Exercise & Equipment Safety: - Individual verbal instruction and demonstration of equipment use and safety with use of the equipment. Flowsheet Row Pulmonary Rehab from 04/06/2016 in [facility] Cardiac and Pulmonary Rehab  Date  04/04/16  Educator  [facility]  Instruction Review Code  2- meets goals/outcomes      Infection Prevention: - Provides verbal and written material to individual with discussion of infection control including proper hand washing and proper equipment cleaning during exercise session. Flowsheet Row Pulmonary Rehab from 04/06/2016 in [facility] Cardiac and Pulmonary Rehab  Date  04/04/16  Educator  [facility]  Instruction Review Code  2- meets goals/outcomes      Falls Prevention: - Provides verbal and written material to individual with discussion of falls prevention and  safety.   Diabetes: - Individual verbal and written instruction to review signs/symptoms of diabetes, desired ranges of glucose level fasting, after meals and with exercise. Advice that pre and post exercise glucose checks will  be done for 3 sessions at entry of program.   Chronic Lung Diseases: - Group verbal and written instruction to review new updates, new respiratory medications, new advancements in procedures and treatments. Provide informative websites and "800" numbers of self-education.   Lung Procedures: - Group verbal and written instruction to describe testing methods done to diagnose lung disease. Review the outcome of test results. Describe the treatment choices: Pulmonary Function Tests, ABGs and oximetry.   Energy Conservation: - Provide group verbal and written instruction for methods to conserve energy, plan and organize activities. Instruct on pacing techniques, use of adaptive equipment and posture/positioning to relieve shortness of breath.   Triggers: - Group verbal and written instruction to review types of environmental controls: home humidity, furnaces, filters, dust mite/pet prevention, HEPA vacuums. To discuss weather changes, air quality and the benefits of nasal washing.   Exacerbations: - Group verbal and written instruction to provide: warning signs, infection symptoms, calling MD promptly, preventive modes, and value of vaccinations. Review: effective airway clearance, coughing and/or vibration techniques. Create an Sports administrator.   Oxygen: - Individual and group verbal and written instruction on oxygen therapy. Includes supplement oxygen, available portable oxygen systems, continuous and intermittent flow rates, oxygen safety, concentrators, and Medicare reimbursement for oxygen.   Respiratory Medications: - Group verbal and written instruction to review medications for lung disease. Drug class, frequency, complications, importance of spacers, rinsing  mouth after steroid MDI's, and proper cleaning methods for nebulizers. Flowsheet Row Pulmonary Rehab from 04/06/2016 in [facility] Cardiac and Pulmonary Rehab  Date  03/28/16  Educator  LB  Instruction Review Code  2- meets goals/outcomes      AED/CPR: - Group verbal and written instruction with the use of models to demonstrate the basic use of the AED with the basic ABC's of resuscitation.   Breathing Retraining: - Provides individuals verbal and written instruction on purpose, frequency, and proper technique of diaphragmatic breathing and pursed-lipped breathing. Applies individual practice skills. Flowsheet Row Pulmonary Rehab from 04/06/2016 in [facility] Cardiac and Pulmonary Rehab  Date  03/28/16  Educator  LB  Instruction Review Code  2- meets goals/outcomes      Anatomy and Physiology of the Lungs: - Group verbal and written instruction with the use of models to provide basic lung anatomy and physiology related to function, structure and complications of lung disease.   Heart Failure: - Group verbal and written instruction on the basics of heart failure: signs/symptoms, treatments, explanation of ejection fraction, enlarged heart and cardiomyopathy.   Sleep Apnea: - Individual verbal and written instruction to review Obstructive Sleep Apnea. Review of risk factors, methods for diagnosing and types of masks and machines for OSA.   Anxiety: - Provides group, verbal and written instruction on the correlation between heart/lung disease and anxiety, treatment options, and management of anxiety.   Relaxation: - Provides group, verbal and written instruction about the benefits of relaxation for patients with heart/lung disease. Also provides patients with examples of relaxation techniques.   Knowledge Questionnaire Score:     Knowledge Questionnaire Score - 03/28/16 1552      Knowledge Questionnaire Score   Pre Score 5/10       Core Components/Risk Factors/Patient Goals at  Admission:     Personal Goals and Risk Factors at Admission - 03/28/16 1558      Core Components/Risk Factors/Patient Goals on Admission    Weight Management Yes;Weight Loss   Intervention Weight Management: Develop a combined nutrition and exercise program designed to reach  desired caloric intake, while maintaining appropriate intake of nutrient and fiber, sodium and fats, and appropriate energy expenditure required for the weight goal.;Weight Management: Provide education and appropriate resources to help participant work on and attain dietary goals.   Admit Weight 147 lb 11.2 oz (67 kg)   Goal Weight: Short Term 142 lb (64.4 kg)   Goal Weight: Long Term 120 lb (54.4 kg)   Expected Outcomes Short Term: Continue to assess and modify interventions until short term weight is achieved;Long Term: Adherence to nutrition and physical activity/exercise program aimed toward attainment of established weight goal;Weight Maintenance: Understanding of the daily nutrition guidelines, which includes 25-35% calories from fat, 7% or less cal from saturated fats, less than 235m cholesterol, less than 1.5gm of sodium, & 5 or more servings of fruits and vegetables daily;Weight Loss: Understanding of general recommendations for a balanced deficit meal plan, which promotes 1-2 lb weight loss per week and includes a negative energy balance of [phone] kcal/d;Understanding recommendations for meals to include 15-35% energy as protein, 25-35% energy from fat, 35-60% energy from carbohydrates, less than 2027mof dietary cholesterol, 20-35 gm of total fiber daily;Understanding of distribution of calorie intake throughout the day with the consumption of 4-5 meals/snacks   Sedentary Yes  Goal: be able to run, play sports, be active with her children   Intervention Provide advice, education, support and counseling about physical activity/exercise needs.;Develop an individualized exercise prescription for aerobic and resistive  training based on initial evaluation findings, risk stratification, comorbidities and participant's personal goals.   Expected Outcomes Achievement of increased cardiorespiratory fitness and enhanced flexibility, muscular endurance and strength shown through measurements of functional capacity and personal statement of participant.   Increase Strength and Stamina Yes   Intervention Provide advice, education, support and counseling about physical activity/exercise needs.;Develop an individualized exercise prescription for aerobic and resistive training based on initial evaluation findings, risk stratification, comorbidities and participant's personal goals.   Expected Outcomes Achievement of increased cardiorespiratory fitness and enhanced flexibility, muscular endurance and strength shown through measurements of functional capacity and personal statement of participant.   Improve shortness of breath with ADL's Yes   Intervention Provide education, individualized exercise plan and daily activity instruction to help decrease symptoms of SOB with activities of daily living.   Expected Outcomes Short Term: Achieves a reduction of symptoms when performing activities of daily living.   Develop more efficient breathing techniques such as purse lipped breathing and diaphragmatic breathing; and practicing self-pacing with activity Yes   Intervention Provide education, demonstration and support about specific breathing techniuqes utilized for more efficient breathing. Include techniques such as pursed lipped breathing, diaphragmatic breathing and self-pacing activity.   Expected Outcomes Short Term: Participant will be able to demonstrate and use breathing techniques as needed throughout daily activities.   Increase knowledge of respiratory medications and ability to use respiratory devices properly  Yes  Qvar; spacer given with instructions.   Intervention Provide education and demonstration as needed of  appropriate use of medications, inhalers, and oxygen therapy.   Expected Outcomes Short Term: Achieves understanding of medications use. Understands that oxygen is a medication prescribed by physician. Demonstrates appropriate use of inhaler and oxygen therapy.      Core Components/Risk Factors/Patient Goals Review:    Core Components/Risk Factors/Patient Goals at Discharge (Final Review):    ITP Comments:   Comments: 30 day review

## 2016-04-07 NOTE — Assessment & Plan Note (Signed)
Continue on current regimen  Labs today .

## 2016-04-08 ENCOUNTER — Encounter: Payer: Managed Care, Other (non HMO) | Attending: Internal Medicine | Admitting: *Deleted

## 2016-04-08 DIAGNOSIS — J849 Interstitial pulmonary disease, unspecified: Secondary | ICD-10-CM | POA: Diagnosis not present

## 2016-04-08 DIAGNOSIS — D869 Sarcoidosis, unspecified: Secondary | ICD-10-CM

## 2016-04-11 NOTE — Progress Notes (Signed)
Daily Session Note  Patient Details  Name: Madison Holt MRN: [id number] Date of Birth: September 11, 1980 Referring Provider:   Flowsheet Row Pulmonary Rehab from 03/28/2016 in [facility]  Referring Provider  Brand Males MD      Encounter Date: 04/08/2016  Check In:      Goals Met:  Proper associated with RPD/PD & O2 Sat Independence with exercise equipment Exercise tolerated well Strength training completed today  Goals Unmet:  Not Applicable  Comments: Pt able to follow exercise prescription today without complaint.  Will continue to monitor for progression.    Dr. Emily Filbert is Medical Director for [facility].

## 2016-04-15 ENCOUNTER — Encounter: Payer: Managed Care, Other (non HMO) | Admitting: *Deleted

## 2016-04-15 DIAGNOSIS — D869 Sarcoidosis, unspecified: Secondary | ICD-10-CM

## 2016-04-15 NOTE — Progress Notes (Signed)
Incomplete Session Note  Patient Details  Name: Madison BushmanJennifer L Holt MRN: [id number] Date of Birth: 06-24-1980 Referring Provider:   Flowsheet Row [facility]  Referring Provider  Kalman Shanamaswamy, Murali MD      Madison Holt did not complete her rehab session. Madison Holt arrived and said she was out sick on Monday and Wednesday. Madison Holt said she was tested for the flu at Urgent Care and it was negative. Madison Holt reports that she is currently on antibioitcs for her cough but no fever. I suggested that she not exercise today so she can recover from this illness better. I only checked her blood pressure which was 112/60.

## 2016-04-18 ENCOUNTER — Encounter: Payer: Self-pay | Admitting: Respiratory Therapy

## 2016-04-18 DIAGNOSIS — D869 Sarcoidosis, unspecified: Secondary | ICD-10-CM

## 2016-04-18 NOTE — Progress Notes (Signed)
Pulmonary Individual Treatment Plan  Patient Details  Name: Madison Holt MRN: [id number] Date of Birth: 07-10-1980 Referring Provider:   Flowsheet Row Pulmonary Rehab from 03/28/2016 in [facility] Cardiac and Pulmonary Rehab  Referring Provider  Brand Males MD      Initial Encounter Date:  Flowsheet Row Pulmonary Rehab from 03/28/2016 in [facility] Cardiac and Pulmonary Rehab  Date  03/28/16  Referring Provider  Brand Males MD      Visit Diagnosis: Sarcoidosis [facility])  Patient's Home Medications on Admission:  Current Outpatient Prescriptions:    acetaminophen (TYLENOL) 325 MG tablet, Take 650 mg by mouth every 6 (six) hours as needed for mild pain or headache. , Disp: , Rfl:    azithromycin (ZITHROMAX) 250 MG tablet, Take 2 tablets (522m) by mouth on Day 1. Take 1 tablet (2535m by mouth on Days 2-5., Disp: , Rfl:    beclomethasone (QVAR) 40 MCG/ACT inhaler, Inhale 2 puffs into the lungs 2 (two) times daily., Disp: , Rfl:    benzonatate (TESSALON) 200 MG capsule, Take by mouth., Disp: , Rfl:    Cholecalciferol (VITAMIN D3) 50000 units CAPS, Take 50,000 Units by mouth once a week., Disp: 4 capsule, Rfl: 2   folic acid (FOLVITE) 1 MG tablet, Take 1 tablet (1 mg total) by mouth daily., Disp: 30 tablet, Rfl: 5   methotrexate (RHEUMATREX) 2.5 MG tablet, Take 3 tablets (7.5 mg total) by mouth once a week. Caution:Chemotherapy. Protect from light. (Patient taking differently: Take 5 mg by mouth once a week. Caution:Chemotherapy. Protect from light.), Disp: 12 tablet, Rfl: 5  Past Medical History: Past Medical History:  Diagnosis Date   Pneumonia 2014   Hosp x 1 week   Shortness of breath dyspnea     Tobacco Use: History  Smoking Status   Former Smoker   Packs/day: 1.00   Years: 5.00   Types: Cigarettes   Quit date: 03/07/1998  Smokeless Tobacco   Never Used    Labs: Recent Review [facility] Latest Ref Rng &  Units 04/10/2015   Cholestrol 0 - 200 mg/dL 132   LDLCALC 0 - 99 mg/dL 51   HDL >39.00 mg/dL 68.60   Trlycerides 0.0 - 149.0 mg/dL 63.0       ADL UCSD:     Pulmonary Assessment Scores    Row Name 03/28/16 1554         ADL UCSD   ADL Phase Entry     SOB Score total 52     Rest 2     Walk 3     Stairs 4     Bath 2     Dress 2     Shop 2        Pulmonary Function Assessment:     Pulmonary Function Assessment - 03/28/16 1552      Initial Spirometry Results   FVC% 103 %   FEV1% 87 %   FEV1/FVC Ratio 70   Comments Test date 05/03/16     Post Bronchodilator Spirometry Results   FVC% 104 %   FEV1% 95 %   FEV1/FVC Ratio 76     Breath   Bilateral Breath Sounds Clear   Shortness of Breath Yes;Limiting activity;Fear of Shortness of Breath      Exercise Target Goals:    Exercise Program Goal: Individual exercise prescription set with THRR, safety & activity barriers. Participant demonstrates ability to understand and report RPE using BORG scale, to  self-measure pulse accurately, and to acknowledge the importance of the exercise prescription.  Exercise Prescription Goal: Starting with aerobic activity 30 plus minutes a day, 3 days per week for initial exercise prescription. Provide home exercise prescription and guidelines that participant acknowledges understanding prior to discharge.  Activity Barriers & Risk Stratification:     Activity Barriers & Cardiac Risk Stratification - 03/28/16 1552      Activity Barriers & Cardiac Risk Stratification   Activity Barriers Shortness of Breath;Deconditioning   Cardiac Risk Stratification Moderate      6 Minute Walk:     6 Minute Walk    Row Name 03/28/16 1621         6 Minute Walk   Phase Initial     Distance 965 feet     Walk Time 4.31 minutes     # of Rest Breaks 1  1:41      MPH 2.54     METS 4.21     RPE 17     Perceived Dyspnea  7     VO2 Peak 14.75     Symptoms Yes (comment)     Comments dizzy,  SOB, tingling in bilateral fingers, hands, and feet, legs wobbly, chest pressure "like someone in sitting on my chest"     Resting HR 66 bpm     Resting BP 106/66     Max Ex. HR 112 bpm     Max Ex. BP 126/66     2 Minute Post BP 126/64       Interval HR   Baseline HR 66     1 Minute HR 85     2 Minute HR 91     4 Minute HR 109     5 Minute HR 112     6 Minute HR 103     2 Minute Post HR 85     Interval Heart Rate? Yes       Interval Oxygen   Interval Oxygen? Yes     Baseline Oxygen Saturation % 96 %     Baseline Liters of Oxygen 0 L  Room Air     1 Minute Oxygen Saturation % 95 %     1 Minute Liters of Oxygen 0 L     2 Minute Oxygen Saturation % 88 %  At 2:54 86%     2 Minute Liters of Oxygen 0 L     3 Minute Oxygen Saturation % 92 %  Rest at 3:51 85% with quick recovery to 99%     3 Minute Liters of Oxygen 0 L     4 Minute Oxygen Saturation % 99 %     4 Minute Liters of Oxygen 0 L     5 Minute Oxygen Saturation % 100 %     5 Minute Liters of Oxygen 0 L     6 Minute Oxygen Saturation % 100 %     6 Minute Liters of Oxygen 0 L     2 Minute Post Oxygen Saturation % 99 %     2 Minute Post Liters of Oxygen 0 L        Initial Exercise Prescription:     Initial Exercise Prescription - 03/28/16 1600      Date of Initial Exercise RX and Referring Provider   Date 03/28/16   Referring Provider Brand Males MD     Treadmill   MPH 2.5   Grade 0.5   Minutes 15   METs  3.09     Recumbant Elliptical   Level 2   RPM 50   Minutes 15   METs 2     T5 Nustep   Level 3   Watts --  60-80 spm   Minutes 15   METs 3     Prescription Details   Frequency (times per week) 3   Duration Progress to 45 minutes of aerobic exercise without signs/symptoms of physical distress     Intensity   THRR 40-80% of Max Heartrate 113-160   Ratings of Perceived Exertion 11-13   Perceived Dyspnea 0-4     Progression   Progression Continue to progress workloads to maintain  intensity without signs/symptoms of physical distress.     Resistance Training   Training Prescription Yes   Weight 3 lbs   Reps 10-15      Perform Capillary Blood Glucose checks as needed.  Exercise Prescription Changes:     Exercise Prescription Changes    Row Name 04/04/16 1300 04/06/16 1200           Exercise Review   Progression  -- --  Second Day of Exercise        Response to Exercise   Blood Pressure (Admit) 112/78 110/70      Blood Pressure (Exercise) 110/74 90/68      Blood Pressure (Exit) 118/60 100/60      Heart Rate (Admit) 82 bpm 88 bpm      Heart Rate (Exercise) 116 bpm 116 bpm      Heart Rate (Exit) 87 bpm 106 bpm      Oxygen Saturation (Admit) 96 % 99 %      Oxygen Saturation (Exercise) 97 % 97 %      Oxygen Saturation (Exit) 98 % 98 %      Rating of Perceived Exertion (Exercise) 14 13      Perceived Dyspnea (Exercise) 3 4      Symptoms  -- SOB on treadmill      Comments  -- Home Exercise Guidelines given 04/06/16      Duration Progress to 45 minutes of aerobic exercise without signs/symptoms of physical distress Progress to 45 minutes of aerobic exercise without signs/symptoms of physical distress      Intensity THRR unchanged THRR unchanged        Progression   Progression Continue to progress workloads to maintain intensity without signs/symptoms of physical distress. Continue to progress workloads to maintain intensity without signs/symptoms of physical distress.      Average METs  -- 2.02        Resistance Training   Training Prescription Yes Yes      Weight 3 green band      Reps 10-15 10-15        Interval Training   Interval Training No No        Treadmill   MPH 2.5 2      Grade 0.5 0.5      Minutes 15 15      METs 3.09 2.67        NuStep   Level  -- 4      Minutes  -- 15        Recumbant Elliptical   Level 2 2      RPM 50 50      Minutes 15 15      METs 1.4 1.6        T5 Nustep   Level 3 3  Watts  -- --  58 spm       Minutes 15 15      METs  -- 1.8        Home Exercise Plan   Plans to continue exercise at  -- Home  walking and using home gym      Frequency  -- Add 1 additional day to program exercise sessions.         Exercise Comments:     Exercise Comments    Row Name 04/04/16 1305 04/06/16 1245 04/06/16 1410       Exercise Comments First full day of exercise!  Patient was oriented to gym and equipment including functions, settings, policies, and procedures.  Patient's individual exercise prescription and treatment plan were reviewed.  All starting workloads were established based on the results of the 6 minute walk test done at initial orientation visit.  The plan for exercise progression was also introduced and progression will be customized based on patient's performance and goals. Reviewed home exercises with Madison Holt today. She plans to walk at home for exercise in addition to using her home gym . Also discussed RPE, pulse, THR, restrictions, s/sx when to call MD, and weather considerations. She verbalized understanding.  Today Madison Holt had a pack of crackers prior to exercise and it was much easier for her to exercise today.        Discharge Exercise Prescription (Final Exercise Prescription Changes):     Exercise Prescription Changes - 04/06/16 1200      Exercise Review   Progression --  Second Day of Exercise     Response to Exercise   Blood Pressure (Admit) 110/70   Blood Pressure (Exercise) 90/68   Blood Pressure (Exit) 100/60   Heart Rate (Admit) 88 bpm   Heart Rate (Exercise) 116 bpm   Heart Rate (Exit) 106 bpm   Oxygen Saturation (Admit) 99 %   Oxygen Saturation (Exercise) 97 %   Oxygen Saturation (Exit) 98 %   Rating of Perceived Exertion (Exercise) 13   Perceived Dyspnea (Exercise) 4   Symptoms SOB on treadmill   Comments Home Exercise Guidelines given 04/06/16   Duration Progress to 45 minutes of aerobic exercise without signs/symptoms of physical distress   Intensity THRR  unchanged     Progression   Progression Continue to progress workloads to maintain intensity without signs/symptoms of physical distress.   Average METs 2.02     Resistance Training   Training Prescription Yes   Weight green band   Reps 10-15     Interval Training   Interval Training No     Treadmill   MPH 2   Grade 0.5   Minutes 15   METs 2.67     NuStep   Level 4   Minutes 15     Recumbant Elliptical   Level 2   RPM 50   Minutes 15   METs 1.6     T5 Nustep   Level 3   Watts --  58 spm   Minutes 15   METs 1.8     Home Exercise Plan   Plans to continue exercise at Home  walking and using home gym   Frequency Add 1 additional day to program exercise sessions.       Nutrition:  Target Goals: Understanding of nutrition guidelines, daily intake of sodium <1589m, cholesterol <2049m calories 30% from fat and 7% or less from saturated fats, daily to have 5 or more servings of fruits and vegetables.  Biometrics:     Pre Biometrics - 03/28/16 1629      Pre Biometrics   Height 5' 3.5" (1.613 m)   Weight 147 lb 11.2 oz (67 kg)   Waist Circumference 32 inches   Hip Circumference 41 inches   Waist to Hip Ratio 0.78 %   BMI (Calculated) 25.8       Nutrition Therapy Plan and Nutrition Goals:   Nutrition Discharge: Rate Your Plate Scores:   Psychosocial: Target Goals: Acknowledge presence or absence of depression, maximize coping skills, provide positive support system. Participant is able to verbalize types and ability to use techniques and skills needed for reducing stress and depression.  Initial Review & Psychosocial Screening:     Initial Psych Review & Screening - 03/28/16 1601      Family Dynamics   Good Support System? Yes   Comments Madison Holt has good support from her family and friends. She states she is not depressed, but is very frustrated with her shortness of breath and limited activity. Madison Holt is looking forward to [facility] to help  her achieve her goals of improved breathing and increased activity.     Screening Interventions   Interventions Encouraged to exercise;Program counselor consult      Quality of Life Scores:     Quality of Life - 03/28/16 1557      Quality of Life Scores   Health/Function Pre 21 %   Socioeconomic Pre 20.88 %   Psych/Spiritual Pre 21 %   Family Pre 21 %   GLOBAL Pre 20.97 %      PHQ-9: Recent Review Flowsheet Data    Depression screen [facility] 2/9 03/28/2016   Decreased Interest 2   Down, Depressed, Hopeless 2   PHQ - 2 Score 4   Altered sleeping 3   Tired, decreased energy 3   Change in appetite 0   Feeling bad or failure about yourself  2   Trouble concentrating 0   Moving slowly or fidgety/restless 0   Suicidal thoughts 0   PHQ-9 Score 12   Difficult doing work/chores Somewhat difficult      Psychosocial Evaluation and Intervention:     Psychosocial Evaluation - 04/04/16 1219      Psychosocial Evaluation & Interventions   Interventions Encouraged to exercise with the program and follow exercise prescription;Relaxation education;Stress management education   Comments Counselor met with Madison. Holt today for initial psychosocial evaluation.  She is a 36 year old who was diagnosed with Sarcoidosis almost a year ago.  She has a strong support system with lots of family close by and a significant other in the home with her.  She reports sleeping better now that she is off the steroids and her appetite has stabilized to where she can actually lose weight now.  She denies a history of depression or anxiety or current symptoms.  counselor brought to her attention that her PHQ-9 score of "12" indicates some possible depressive symptoms.  Discussed this with her stating her interrupted sleep and the stress of this diagnosis at such a young age have impacted her mood.  She is trying to determine her "new norm" and what to expect from this point on since she used to be so active in sports,  etc.  Other stress in her life are parenting (26) children with her (3) boys and (2) steps there half time.  Her goals for this program are to learn how to "breathe again."  Counselor encouraged her to participate  in the psychoeducational components of this program to help with stress and relaxation.  Staff will be following with Madison. Holt throughout the course of this program.     Continued Psychosocial Services Needed Yes  Follow on mood and sleep once exercises a few weeks to see if symptoms improve.       Psychosocial Re-Evaluation:  Education: Education Goals: Education classes will be provided on a weekly basis, covering required topics. Participant will state understanding/return demonstration of topics presented.  Learning Barriers/Preferences:     Learning Barriers/Preferences - 03/28/16 1552      Learning Barriers/Preferences   Learning Barriers None   Learning Preferences None      Education Topics: Initial Evaluation Education: - Verbal, written and demonstration of respiratory meds, RPE/PD scales, oximetry and breathing techniques. Instruction on use of nebulizers and MDIs: cleaning and proper use, rinsing mouth with steroid doses and importance of monitoring MDI activations. Flowsheet Row Pulmonary Rehab from 04/08/2016 in [facility] Cardiac and Pulmonary Rehab  Date  03/28/16  Educator  LB  Instruction Review Code  2- meets goals/outcomes      General Nutrition Guidelines/Fats and Fiber: -Group instruction provided by verbal, written material, models and posters to present the general guidelines for heart healthy nutrition. Gives an explanation and review of dietary fats and fiber.   Controlling Sodium/Reading Food Labels: -Group verbal and written material supporting the discussion of sodium use in heart healthy nutrition. Review and explanation with models, verbal and written materials for utilization of the food label.   Exercise Physiology & Risk Factors: - Group  verbal and written instruction with models to review the exercise physiology of the cardiovascular system and associated critical values. Details cardiovascular disease risk factors and the goals associated with each risk factor.   Aerobic Exercise & Resistance Training: - Gives group verbal and written discussion on the health impact of inactivity. On the components of aerobic and resistive training programs and the benefits of this training and how to safely progress through these programs. Flowsheet Row Pulmonary Rehab from 04/08/2016 in [facility] Cardiac and Pulmonary Rehab  Date  04/06/16  Educator  [facility]  Instruction Review Code  2- meets goals/outcomes      Flexibility, Balance, General Exercise Guidelines: - Provides group verbal and written instruction on the benefits of flexibility and balance training programs. Provides general exercise guidelines with specific guidelines to those with heart or lung disease. Demonstration and skill practice provided.   Stress Management: - Provides group verbal and written instruction about the health risks of elevated stress, cause of high stress, and healthy ways to reduce stress.   Depression: - Provides group verbal and written instruction on the correlation between heart/lung disease and depressed mood, treatment options, and the stigmas associated with seeking treatment.   Exercise & Equipment Safety: - Individual verbal instruction and demonstration of equipment use and safety with use of the equipment. Flowsheet Row Pulmonary Rehab from 04/08/2016 in [facility] Cardiac and Pulmonary Rehab  Date  04/04/16  Educator  [facility]  Instruction Review Code  2- meets goals/outcomes      Infection Prevention: - Provides verbal and written material to individual with discussion of infection control including proper hand washing and proper equipment cleaning during exercise session. Flowsheet Row Pulmonary Rehab from 04/08/2016 in [facility] Cardiac and Pulmonary Rehab   Date  04/04/16  Educator  [facility]  Instruction Review Code  2- meets goals/outcomes      Falls Prevention: - Provides verbal and written material to  individual with discussion of falls prevention and safety.   Diabetes: - Individual verbal and written instruction to review signs/symptoms of diabetes, desired ranges of glucose level fasting, after meals and with exercise. Advice that pre and post exercise glucose checks will be done for 3 sessions at entry of program.   Chronic Lung Diseases: - Group verbal and written instruction to review new updates, new respiratory medications, new advancements in procedures and treatments. Provide informative websites and "800" numbers of self-education.   Lung Procedures: - Group verbal and written instruction to describe testing methods done to diagnose lung disease. Review the outcome of test results. Describe the treatment choices: Pulmonary Function Tests, ABGs and oximetry.   Energy Conservation: - Provide group verbal and written instruction for methods to conserve energy, plan and organize activities. Instruct on pacing techniques, use of adaptive equipment and posture/positioning to relieve shortness of breath.   Triggers: - Group verbal and written instruction to review types of environmental controls: home humidity, furnaces, filters, dust mite/pet prevention, HEPA vacuums. To discuss weather changes, air quality and the benefits of nasal washing.   Exacerbations: - Group verbal and written instruction to provide: warning signs, infection symptoms, calling MD promptly, preventive modes, and value of vaccinations. Review: effective airway clearance, coughing and/or vibration techniques. Create an Sports administrator.   Oxygen: - Individual and group verbal and written instruction on oxygen therapy. Includes supplement oxygen, available portable oxygen systems, continuous and intermittent flow rates, oxygen safety, concentrators, and Medicare  reimbursement for oxygen.   Respiratory Medications: - Group verbal and written instruction to review medications for lung disease. Drug class, frequency, complications, importance of spacers, rinsing mouth after steroid MDI's, and proper cleaning methods for nebulizers. Flowsheet Row Pulmonary Rehab from 04/08/2016 in [facility] Cardiac and Pulmonary Rehab  Date  03/28/16  Educator  LB  Instruction Review Code  2- meets goals/outcomes      AED/CPR: - Group verbal and written instruction with the use of models to demonstrate the basic use of the AED with the basic ABC's of resuscitation.   Breathing Retraining: - Provides individuals verbal and written instruction on purpose, frequency, and proper technique of diaphragmatic breathing and pursed-lipped breathing. Applies individual practice skills. Flowsheet Row Pulmonary Rehab from 04/08/2016 in [facility] Cardiac and Pulmonary Rehab  Date  03/28/16  Educator  LB  Instruction Review Code  2- meets goals/outcomes      Anatomy and Physiology of the Lungs: - Group verbal and written instruction with the use of models to provide basic lung anatomy and physiology related to function, structure and complications of lung disease.   Heart Failure: - Group verbal and written instruction on the basics of heart failure: signs/symptoms, treatments, explanation of ejection fraction, enlarged heart and cardiomyopathy.   Sleep Apnea: - Individual verbal and written instruction to review Obstructive Sleep Apnea. Review of risk factors, methods for diagnosing and types of masks and machines for OSA.   Anxiety: - Provides group, verbal and written instruction on the correlation between heart/lung disease and anxiety, treatment options, and management of anxiety.   Relaxation: - Provides group, verbal and written instruction about the benefits of relaxation for patients with heart/lung disease. Also provides patients with examples of relaxation  techniques.   Knowledge Questionnaire Score:     Knowledge Questionnaire Score - 03/28/16 1552      Knowledge Questionnaire Score   Pre Score 5/10       Core Components/Risk Factors/Patient Goals at Admission:     Personal  Goals and Risk Factors at Admission - 03/28/16 1558      Core Components/Risk Factors/Patient Goals on Admission    Weight Management Yes;Weight Loss   Intervention Weight Management: Develop a combined nutrition and exercise program designed to reach desired caloric intake, while maintaining appropriate intake of nutrient and fiber, sodium and fats, and appropriate energy expenditure required for the weight goal.;Weight Management: Provide education and appropriate resources to help participant work on and attain dietary goals.   Admit Weight 147 lb 11.2 oz (67 kg)   Goal Weight: Short Term 142 lb (64.4 kg)   Goal Weight: Long Term 120 lb (54.4 kg)   Expected Outcomes Short Term: Continue to assess and modify interventions until short term weight is achieved;Long Term: Adherence to nutrition and physical activity/exercise program aimed toward attainment of established weight goal;Weight Maintenance: Understanding of the daily nutrition guidelines, which includes 25-35% calories from fat, 7% or less cal from saturated fats, less than 235m cholesterol, less than 1.5gm of sodium, & 5 or more servings of fruits and vegetables daily;Weight Loss: Understanding of general recommendations for a balanced deficit meal plan, which promotes 1-2 lb weight loss per week and includes a negative energy balance of [phone] kcal/d;Understanding recommendations for meals to include 15-35% energy as protein, 25-35% energy from fat, 35-60% energy from carbohydrates, less than 2056mof dietary cholesterol, 20-35 gm of total fiber daily;Understanding of distribution of calorie intake throughout the day with the consumption of 4-5 meals/snacks   Sedentary Yes  Goal: be able to run, play  sports, be active with her children   Intervention Provide advice, education, support and counseling about physical activity/exercise needs.;Develop an individualized exercise prescription for aerobic and resistive training based on initial evaluation findings, risk stratification, comorbidities and participant's personal goals.   Expected Outcomes Achievement of increased cardiorespiratory fitness and enhanced flexibility, muscular endurance and strength shown through measurements of functional capacity and personal statement of participant.   Increase Strength and Stamina Yes   Intervention Provide advice, education, support and counseling about physical activity/exercise needs.;Develop an individualized exercise prescription for aerobic and resistive training based on initial evaluation findings, risk stratification, comorbidities and participant's personal goals.   Expected Outcomes Achievement of increased cardiorespiratory fitness and enhanced flexibility, muscular endurance and strength shown through measurements of functional capacity and personal statement of participant.   Improve shortness of breath with ADL's Yes   Intervention Provide education, individualized exercise plan and daily activity instruction to help decrease symptoms of SOB with activities of daily living.   Expected Outcomes Short Term: Achieves a reduction of symptoms when performing activities of daily living.   Develop more efficient breathing techniques such as purse lipped breathing and diaphragmatic breathing; and practicing self-pacing with activity Yes   Intervention Provide education, demonstration and support about specific breathing techniuqes utilized for more efficient breathing. Include techniques such as pursed lipped breathing, diaphragmatic breathing and self-pacing activity.   Expected Outcomes Short Term: Participant will be able to demonstrate and use breathing techniques as needed throughout daily  activities.   Increase knowledge of respiratory medications and ability to use respiratory devices properly  Yes  Qvar; spacer given with instructions.   Intervention Provide education and demonstration as needed of appropriate use of medications, inhalers, and oxygen therapy.   Expected Outcomes Short Term: Achieves understanding of medications use. Understands that oxygen is a medication prescribed by physician. Demonstrates appropriate use of inhaler and oxygen therapy.      Core Components/Risk Factors/Patient Goals Review:  Core Components/Risk Factors/Patient Goals at Discharge (Final Review):    ITP Comments:     ITP Comments    Row Name 04/08/16 1130 04/15/16 1135         ITP Comments Attended [employer] Education Class See incomplete session note. Marnisha arrived and said she was sorry but could not attend Pulm Rehab on Monday or Wed since she was sick with an upper resp infection. Danise Mina reports that she had a negative flu test but that she is on antiibiotcs. I suggested that she not exercise so she can recover better.          30 Day Note Review Comments:

## 2016-04-20 ENCOUNTER — Encounter: Payer: Managed Care, Other (non HMO) | Admitting: Respiratory Therapy

## 2016-04-20 DIAGNOSIS — D869 Sarcoidosis, unspecified: Secondary | ICD-10-CM

## 2016-04-20 DIAGNOSIS — J849 Interstitial pulmonary disease, unspecified: Secondary | ICD-10-CM | POA: Diagnosis not present

## 2016-04-20 NOTE — Progress Notes (Signed)
Daily Session Note  Patient Details  Name: AJAYLA IGLESIAS MRN: [id number] Date of Birth: [birth year] Referring Provider:   Flowsheet Row Pulmonary Rehab from 03/28/2016 in [facility] Cardiac and Pulmonary Rehab  Referring Provider  Brand Males MD      Encounter Date: 04/20/2016  Check In:     Session Check In - 04/20/16 1151      Check-In   Location [facility]-Cardiac & Pulmonary Rehab   Staff Present Carson Myrtle, BS, RRT, Respiratory Lennie Hummer, MA, ACSM RCEP, Exercise Physiologist;Levy Wellman RN BSN   Supervising physician immediately available to respond to emergencies [facility] immediately available ER MD   Physician(s) Drs. Malinda & McShane   Medication changes reported     No   Fall or balance concerns reported    No   Warm-up and Cool-down Performed as group-led instruction   Resistance Training Performed Yes   VAD Patient? No     Pain Assessment   Currently in Pain? No/denies   Multiple Pain Sites No         Goals Met:  Proper associated with RPD/PD & O2 Sat Independence with exercise equipment Using PLB without cueing & demonstrates good technique Exercise tolerated well Strength training completed today  Goals Unmet:  Not Applicable  Comments: Pt able to follow exercise prescription today without complaint.  Will continue to monitor for progression.    Dr. Emily Filbert is Medical Director for [place] and [facility].

## 2016-04-22 ENCOUNTER — Encounter: Payer: Managed Care, Other (non HMO) | Admitting: *Deleted

## 2016-04-22 DIAGNOSIS — J849 Interstitial pulmonary disease, unspecified: Secondary | ICD-10-CM | POA: Diagnosis not present

## 2016-04-22 DIAGNOSIS — D869 Sarcoidosis, unspecified: Secondary | ICD-10-CM

## 2016-04-22 NOTE — Progress Notes (Signed)
Daily Session Note  Patient Details  Name: Madison Holt MRN: [id number] Date of Birth: 1980/04/09 Referring Provider:   Flowsheet Row Pulmonary Rehab from 03/28/2016 in [facility] Cardiac and Pulmonary Rehab  Referring Provider  Brand Males MD      Encounter Date: 04/22/2016  Check In:     Session Check In - 04/22/16 1139      Check-In   Location [facility]-Cardiac & Pulmonary Rehab   Staff Present Alberteen Sam, MA, ACSM RCEP, Exercise Physiologist;Other;Carroll Enterkin, RN, BSN   Supervising physician immediately available to respond to emergencies [facility] immediately available ER MD   Physician(s) Drs. Darl Householder and Rifenbark   Medication changes reported     No   Fall or balance concerns reported    No   Warm-up and Cool-down Performed as group-led Location manager Performed Yes   VAD Patient? No     Pain Assessment   Currently in Pain? No/denies   Multiple Pain Sites No         Goals Met:  Proper associated with RPD/PD & O2 Sat Independence with exercise equipment Using PLB without cueing & demonstrates good technique Exercise tolerated well Strength training completed today  Goals Unmet:  Not Applicable  Comments: Pt able to follow exercise prescription today without complaint.  Will continue to monitor for progression.    Dr. Emily Filbert is Medical Director for [place] and [facility].

## 2016-04-25 DIAGNOSIS — J849 Interstitial pulmonary disease, unspecified: Secondary | ICD-10-CM | POA: Diagnosis not present

## 2016-04-25 DIAGNOSIS — D869 Sarcoidosis, unspecified: Secondary | ICD-10-CM

## 2016-04-25 NOTE — Progress Notes (Signed)
Daily Session Note  Patient Details  Name: RHILYNN PREYER MRN: [id number] Date of Birth: 12/19/1980 Referring Provider:   Flowsheet Row Pulmonary Rehab from 03/28/2016 in [facility] Cardiac and Pulmonary Rehab  Referring Provider  Brand Males MD      Encounter Date: 04/25/2016  Check In:     Session Check In - 04/25/16 1253      Check-In   Location [facility]-Cardiac & Pulmonary Rehab   Staff Present Earlean Shawl, BS, ACSM CEP, Exercise Physiologist;Laureen Owens Shark, BS, RRT, Respiratory Dareen Piano, BA, ACSM CEP, Exercise Physiologist   Supervising physician immediately available to respond to emergencies [facility] immediately available ER MD   Physician(s) Alfred Levins and Corky Downs   Medication changes reported     No   Fall or balance concerns reported    No   Warm-up and Cool-down Performed as group-led Location manager Performed Yes   VAD Patient? No     Pain Assessment   Currently in Pain? No/denies   Multiple Pain Sites No         Goals Met:  Proper associated with RPD/PD & O2 Sat Independence with exercise equipment Exercise tolerated well Strength training completed today  Goals Unmet:  Not Applicable  Comments: Pt able to follow exercise prescription today without complaint.  Will continue to monitor for progression.    Dr. Emily Filbert is Medical Director for [place] and [facility].

## 2016-05-02 DIAGNOSIS — D869 Sarcoidosis, unspecified: Secondary | ICD-10-CM

## 2016-05-02 DIAGNOSIS — J849 Interstitial pulmonary disease, unspecified: Secondary | ICD-10-CM | POA: Diagnosis not present

## 2016-05-02 NOTE — Progress Notes (Signed)
Daily Session Note  Patient Details  Name: Madison Holt MRN: [id number] Date of Birth: 1980/07/09 Referring Provider:   Flowsheet Row Pulmonary Rehab from 03/28/2016 in [facility] Cardiac and Pulmonary Rehab  Referring Provider  Brand Males MD      Encounter Date: 05/02/2016  Check In:     Session Check In - 05/02/16 1511      Check-In   Location [facility]-Cardiac & Pulmonary Rehab   Staff Present Carson Myrtle, BS, RRT, Respiratory Therapist;Kelly Amedeo Plenty, BS, ACSM CEP, Exercise Physiologist;Sarah Zerby Oletta Darter, BA, ACSM CEP, Exercise Physiologist   Supervising physician immediately available to respond to emergencies [facility] immediately available ER MD   Physician(s) Jimmye Norman and Quentin Cornwall   Medication changes reported     No   Fall or balance concerns reported    No   Warm-up and Cool-down Performed as group-led Location manager Performed Yes   VAD Patient? No     Pain Assessment   Currently in Pain? No/denies         History  Smoking Status  . Former Smoker  . Packs/day: 1.00  . Years: 5.00  . Types: Cigarettes  . Quit date: 03/07/1998  Smokeless Tobacco  . Never Used    Goals Met:  Proper associated with RPD/PD & O2 Sat Independence with exercise equipment Exercise tolerated well Strength training completed today  Goals Unmet:  Not Applicable  Comments: Pt able to follow exercise prescription today without complaint.  Will continue to monitor for progression.    Dr. Emily Filbert is Medical Director for [place] and [facility].

## 2016-05-03 ENCOUNTER — Encounter: Payer: Self-pay | Admitting: Internal Medicine

## 2016-05-03 NOTE — Telephone Encounter (Signed)
MR please advise if we can give pt another letter to park close to her building at work. Thanks.

## 2016-05-06 ENCOUNTER — Encounter: Payer: Managed Care, Other (non HMO) | Attending: Internal Medicine

## 2016-05-06 ENCOUNTER — Encounter: Payer: Self-pay | Admitting: *Deleted

## 2016-05-06 DIAGNOSIS — J849 Interstitial pulmonary disease, unspecified: Secondary | ICD-10-CM | POA: Insufficient documentation

## 2016-05-06 NOTE — Telephone Encounter (Signed)
That is fine to write letter for parking up front  Thanks  Dr. Kalman ShanMurali Seila Liston, M.D., [facility] Pulmonary and Critical Care Medicine Staff Physician [facility] Pager: [phone], If no answer or between  15:00h - 7:00h: call [phone]  05/06/2016 10:34 AM

## 2016-05-11 ENCOUNTER — Encounter: Payer: Self-pay | Admitting: Respiratory Therapy

## 2016-05-16 ENCOUNTER — Encounter: Payer: Self-pay | Admitting: *Deleted

## 2016-05-16 DIAGNOSIS — D869 Sarcoidosis, unspecified: Secondary | ICD-10-CM

## 2016-05-16 NOTE — Progress Notes (Signed)
Pulmonary Individual Treatment Plan  Patient Details  Name: Madison Holt MRN: [id number] Date of Birth: 05/21/1980 Referring Provider:   Flowsheet Row Pulmonary Rehab from 03/28/2016 in [facility] Cardiac and Pulmonary Rehab  Referring Provider  Brand Males MD      Initial Encounter Date:  Flowsheet Row Pulmonary Rehab from 03/28/2016 in [facility] Cardiac and Pulmonary Rehab  Date  03/28/16  Referring Provider  Brand Males MD      Visit Diagnosis: Sarcoidosis [facility])  Patient's Home Medications on Admission:  Current Outpatient Prescriptions:  .  acetaminophen (TYLENOL) 325 MG tablet, Take 650 mg by mouth every 6 (six) hours as needed for mild pain or headache. , Disp: , Rfl:  .  azithromycin (ZITHROMAX) 250 MG tablet, Take 2 tablets (521m) by mouth on Day 1. Take 1 tablet (2546m by mouth on Days 2-5., Disp: , Rfl:  .  beclomethasone (QVAR) 40 MCG/ACT inhaler, Inhale 2 puffs into the lungs 2 (two) times daily., Disp: , Rfl:  .  benzonatate (TESSALON) 200 MG capsule, Take by mouth., Disp: , Rfl:  .  Cholecalciferol (VITAMIN D3) 50000 units CAPS, Take 50,000 Units by mouth once a week., Disp: 4 capsule, Rfl: 2 .  folic acid (FOLVITE) 1 MG tablet, Take 1 tablet (1 mg total) by mouth daily., Disp: 30 tablet, Rfl: 5 .  methotrexate (RHEUMATREX) 2.5 MG tablet, Take 3 tablets (7.5 mg total) by mouth once a week. Caution:Chemotherapy. Protect from light. (Patient taking differently: Take 5 mg by mouth once a week. Caution:Chemotherapy. Protect from light.), Disp: 12 tablet, Rfl: 5  Past Medical History: Past Medical History:  Diagnosis Date  . Pneumonia 2014   Hosp x 1 week  . Shortness of breath dyspnea     Tobacco Use: History  Smoking Status  . Former Smoker  . Packs/day: 1.00  . Years: 5.00  . Types: Cigarettes  . Quit date: 03/07/1998  Smokeless Tobacco  . Never Used    Labs: Recent Review Flowsheet Data    Labs for ITP Cardiac and Pulmonary Rehab Latest Ref Rng &  Units 04/10/2015   Cholestrol 0 - 200 mg/dL 132   LDLCALC 0 - 99 mg/dL 51   HDL >39.00 mg/dL 68.60   Trlycerides 0.0 - 149.0 mg/dL 63.0       ADL UCSD:     Pulmonary Assessment Scores    Row Name 03/28/16 1554         ADL UCSD   ADL Phase Entry     SOB Score total 52     Rest 2     Walk 3     Stairs 4     Bath 2     Dress 2     Shop 2        Pulmonary Function Assessment:     Pulmonary Function Assessment - 03/28/16 1552      Initial Spirometry Results   FVC% 103 %   FEV1% 87 %   FEV1/FVC Ratio 70   Comments Test date 05/03/16     Post Bronchodilator Spirometry Results   FVC% 104 %   FEV1% 95 %   FEV1/FVC Ratio 76     Breath   Bilateral Breath Sounds Clear   Shortness of Breath Yes;Limiting activity;Fear of Shortness of Breath      Exercise Target Goals:    Exercise Program Goal: Individual exercise prescription set with THRR, safety & activity barriers. Participant demonstrates ability to understand and report RPE using BORG scale, to  self-measure pulse accurately, and to acknowledge the importance of the exercise prescription.  Exercise Prescription Goal: Starting with aerobic activity 30 plus minutes a day, 3 days per week for initial exercise prescription. Provide home exercise prescription and guidelines that participant acknowledges understanding prior to discharge.  Activity Barriers & Risk Stratification:     Activity Barriers & Cardiac Risk Stratification - 03/28/16 1552      Activity Barriers & Cardiac Risk Stratification   Activity Barriers Shortness of Breath;Deconditioning   Cardiac Risk Stratification Moderate      6 Minute Walk:     6 Minute Walk    Row Name 03/28/16 1621         6 Minute Walk   Phase Initial     Distance 965 feet     Walk Time 4.31 minutes     # of Rest Breaks 1  1:41      MPH 2.54     METS 4.21     RPE 17     Perceived Dyspnea  7     VO2 Peak 14.75     Symptoms Yes (comment)     Comments dizzy,  SOB, tingling in bilateral fingers, hands, and feet, legs wobbly, chest pressure "like someone in sitting on my chest"     Resting HR 66 bpm     Resting BP 106/66     Max Ex. HR 112 bpm     Max Ex. BP 126/66     2 Minute Post BP 126/64       Interval HR   Baseline HR 66     1 Minute HR 85     2 Minute HR 91     4 Minute HR 109     5 Minute HR 112     6 Minute HR 103     2 Minute Post HR 85     Interval Heart Rate? Yes       Interval Oxygen   Interval Oxygen? Yes     Baseline Oxygen Saturation % 96 %     Baseline Liters of Oxygen 0 L  Room Air     1 Minute Oxygen Saturation % 95 %     1 Minute Liters of Oxygen 0 L     2 Minute Oxygen Saturation % 88 %  At 2:54 86%     2 Minute Liters of Oxygen 0 L     3 Minute Oxygen Saturation % 92 %  Rest at 3:51 85% with quick recovery to 99%     3 Minute Liters of Oxygen 0 L     4 Minute Oxygen Saturation % 99 %     4 Minute Liters of Oxygen 0 L     5 Minute Oxygen Saturation % 100 %     5 Minute Liters of Oxygen 0 L     6 Minute Oxygen Saturation % 100 %     6 Minute Liters of Oxygen 0 L     2 Minute Post Oxygen Saturation % 99 %     2 Minute Post Liters of Oxygen 0 L       Oxygen Initial Assessment:   Oxygen Re-Evaluation:   Oxygen Discharge (Final Oxygen Re-Evaluation):   Initial Exercise Prescription:     Initial Exercise Prescription - 03/28/16 1600      Date of Initial Exercise RX and Referring Provider   Date 03/28/16   Referring Provider Brand Males MD     Treadmill  MPH 2.5   Grade 0.5   Minutes 15   METs 3.09     Recumbant Elliptical   Level 2   RPM 50   Minutes 15   METs 2     T5 Nustep   Level 3   SPM --  60-80 spm   Minutes 15   METs 3     Prescription Details   Frequency (times per week) 3   Duration Progress to 45 minutes of aerobic exercise without signs/symptoms of physical distress     Intensity   THRR 40-80% of Max Heartrate 113-160   Ratings of Perceived Exertion 11-13    Perceived Dyspnea 0-4     Progression   Progression Continue to progress workloads to maintain intensity without signs/symptoms of physical distress.     Resistance Training   Training Prescription Yes   Weight 3 lbs   Reps 10-15      Perform Capillary Blood Glucose checks as needed.  Exercise Prescription Changes:     Exercise Prescription Changes    Row Name 04/04/16 1300 04/06/16 1200 04/20/16 1500 05/04/16 1500       Response to Exercise   Blood Pressure (Admit) 112/78 110/70 102/68 112/80    Blood Pressure (Exercise) 110/74 90/68 126/64 122/60    Blood Pressure (Exit) 118/60 100/60 124/62 114/70    Heart Rate (Admit) 82 bpm 88 bpm 102 bpm 91 bpm    Heart Rate (Exercise) 116 bpm 116 bpm 117 bpm 104 bpm    Heart Rate (Exit) 87 bpm 106 bpm 114 bpm 90 bpm    Oxygen Saturation (Admit) 96 % 99 % 98 % 100 %    Oxygen Saturation (Exercise) 97 % 97 % 96 % 99 %    Oxygen Saturation (Exit) 98 % 98 % 92 % 100 %    Rating of Perceived Exertion (Exercise) '14 13 12 12    ' Perceived Dyspnea (Exercise) '3 4 3 2    ' Symptoms  - SOB on treadmill SOB on treadmill SOB on treadmill    Comments  - Home Exercise Guidelines given 04/06/16 Home Exercise Guidelines given 04/06/16  -    Duration Progress to 45 minutes of aerobic exercise without signs/symptoms of physical distress Progress to 45 minutes of aerobic exercise without signs/symptoms of physical distress Progress to 45 minutes of aerobic exercise without signs/symptoms of physical distress Progress to 45 minutes of aerobic exercise without signs/symptoms of physical distress    Intensity THRR unchanged THRR unchanged THRR unchanged THRR unchanged      Progression   Progression Continue to progress workloads to maintain intensity without signs/symptoms of physical distress. Continue to progress workloads to maintain intensity without signs/symptoms of physical distress. Continue to progress workloads to maintain intensity without signs/symptoms  of physical distress. Continue to progress workloads to maintain intensity without signs/symptoms of physical distress.    Average METs  - 2.02 2.16 2.39      Resistance Training   Training Prescription Yes Yes Yes Yes    Weight 3 green band 3 lbs 3 lbs    Reps 10-15 10-15 10-15 10-15      Interval Training   Interval Training No No No No      Treadmill   MPH 2.'5 2 2 2    ' Grade 0.5 0.5 0.5 0.5    Minutes '15 15 15 15    ' METs 3.09 2.67 2.67 2.67      NuStep   Level  - 4  -  -  Minutes  - 15  -  -      Recumbant Elliptical   Level '2 2 2 2    ' RPM 50 50 50  -    Minutes '15 15 15 10    ' METs 1.4 1.6 1.9 2.2      T5 Nustep   Level '3 3 3 3    ' SPM  - -  58 spm  - 90    Minutes '15 15 15 10    ' METs  - 1.8 1.9  70 spm 2.3      Home Exercise Plan   Plans to continue exercise at  - Home  walking and using home gym Home  walking and using home gym Home (comment)  walking and using home gym    Frequency  - Add 1 additional day to program exercise sessions. Add 1 additional day to program exercise sessions. Add 1 additional day to program exercise sessions.    Initial Home Exercises Provided  -  -  - 04/06/16      Exercise Review   Progression  - -  Second Day of Exercise Yes  -       Exercise Comments:     Exercise Comments    Row Name 04/04/16 1305 04/06/16 1245 04/06/16 1410 04/20/16 1542     Exercise Comments First full day of exercise!  Patient was oriented to gym and equipment including functions, settings, policies, and procedures.  Patient's individual exercise prescription and treatment plan were reviewed.  All starting workloads were established based on the results of the 6 minute walk test done at initial orientation visit.  The plan for exercise progression was also introduced and progression will be customized based on patient's performance and goals. Reviewed home exercises with Delsa Sale today. She plans to walk at home for exercise in addition to using her home gym .  Also discussed RPE, pulse, THR, restrictions, s/sx when to call MD, and weather considerations. She verbalized understanding.  Today Jenn had a pack of crackers prior to exercise and it was much easier for her to exercise today. Madison Holt is off to a good start with rehab.  She was able to get in over 20 min on the treadmill today.  We will continue to monitor her progression.  She is scheduled for a stress echo next Friday.       Exercise Goals and Review:   Exercise Goals Re-Evaluation :     Exercise Goals Re-Evaluation    Row Name 05/04/16 1557             Exercise Goal Re-Evaluation   Exercise Goals Review Increase Physical Activity;Increase Strenth and Stamina       Comments Madison Holt continues to do well in rehab.  She is still getting short of breath on the treadmill, but she is now able to go for 10 min before having to take a rest break.   She is up to level 3 on the T5.  We will continue to monitor her progression.       Expected Outcomes Short: Madison Holt will work up to a full 15 min on the treadmill.  Long: Madison Holt will continue to work on her strength and stamina by coming to class and walking at home.          Discharge Exercise Prescription (Final Exercise Prescription Changes):     Exercise Prescription Changes - 05/04/16 1500      Response to Exercise   Blood Pressure (Admit)  112/80   Blood Pressure (Exercise) 122/60   Blood Pressure (Exit) 114/70   Heart Rate (Admit) 91 bpm   Heart Rate (Exercise) 104 bpm   Heart Rate (Exit) 90 bpm   Oxygen Saturation (Admit) 100 %   Oxygen Saturation (Exercise) 99 %   Oxygen Saturation (Exit) 100 %   Rating of Perceived Exertion (Exercise) 12   Perceived Dyspnea (Exercise) 2   Symptoms SOB on treadmill   Duration Progress to 45 minutes of aerobic exercise without signs/symptoms of physical distress   Intensity THRR unchanged     Progression   Progression Continue to progress workloads to maintain intensity without signs/symptoms of  physical distress.   Average METs 2.39     Resistance Training   Training Prescription Yes   Weight 3 lbs   Reps 10-15     Interval Training   Interval Training No     Treadmill   MPH 2   Grade 0.5   Minutes 15   METs 2.67     Recumbant Elliptical   Level 2   Minutes 10   METs 2.2     T5 Nustep   Level 3   SPM 90   Minutes 10   METs 2.3     Home Exercise Plan   Plans to continue exercise at Home (comment)  walking and using home gym   Frequency Add 1 additional day to program exercise sessions.   Initial Home Exercises Provided 04/06/16      Nutrition:  Target Goals: Understanding of nutrition guidelines, daily intake of sodium <159m, cholesterol <2093m calories 30% from fat and 7% or less from saturated fats, daily to have 5 or more servings of fruits and vegetables.  Biometrics:     Pre Biometrics - 03/28/16 1629      Pre Biometrics   Height 5' 3.5" (1.613 m)   Weight 147 lb 11.2 oz (67 kg)   Waist Circumference 32 inches   Hip Circumference 41 inches   Waist to Hip Ratio 0.78 %   BMI (Calculated) 25.8       Nutrition Therapy Plan and Nutrition Goals:   Nutrition Discharge: Rate Your Plate Scores:   Nutrition Goals Re-Evaluation:   Nutrition Goals Discharge (Final Nutrition Goals Re-Evaluation):   Psychosocial: Target Goals: Acknowledge presence or absence of significant depression and/or stress, maximize coping skills, provide positive support system. Participant is able to verbalize types and ability to use techniques and skills needed for reducing stress and depression.   Initial Review & Psychosocial Screening:     Initial Psych Review & Screening - 03/28/16 1601      Family Dynamics   Good Support System? Yes   Comments Ms StLemboas good support from her family and friends. She states she is not depressed, but is very frustrated with her shortness of breath and limited activity. Ms StEnriquess looking forward to [facility] to help her  achieve her goals of improved breathing and increased activity.     Screening Interventions   Interventions Encouraged to exercise;Program counselor consult      Quality of Life Scores:     Quality of Life - 03/28/16 1557      Quality of Life Scores   Health/Function Pre 21 %   Socioeconomic Pre 20.88 %   Psych/Spiritual Pre 21 %   Family Pre 21 %   GLOBAL Pre 20.97 %      PHQ-9: Recent Review Flowsheet Data    [facility]  03/28[facility]2016   Decreased Interest 2   Down, Depressed, Hopeless 2   PHQ - 2 Score 4   Altered sleeping 3   Tired, decreased energy 3   Change in appetite 0   Feeling bad or failure about yourself  2   Trouble concentrating 0   Moving slowly or fidgety/restless 0   Suicidal thoughts 0   PHQ-9 Score 12   Difficult doing work/chores Somewhat difficult     Interpretation of Total Score  Total Score Depression Severity:  1-4 = Minimal depression, 5-9 = Mild depression, 10-14 = Moderate depression, 15-19 = Moderately severe depression, 20-27 = Severe depression   Psychosocial Evaluation and Intervention:     Psychosocial Evaluation - 04/04/16 1219      Psychosocial Evaluation & Interventions   Interventions Encouraged to exercise with the program and follow exercise prescription;Relaxation education;Stress management education   Comments Counselor met with Ms. Deland today for initial psychosocial evaluation.  She is a 36 year old who was diagnosed with Sarcoidosis almost a year ago.  She has a strong support system with lots of family close by and a significant other in the home with her.  She reports sleeping better now that she is off the steroids and her appetite has stabilized to where she can actually lose weight now.  She denies a history of depression or anxiety or current symptoms.  counselor brought to her attention that her PHQ-9 score of "12" indicates some possible depressive symptoms.  Discussed this with her stating her  interrupted sleep and the stress of this diagnosis at such a young age have impacted her mood.  She is trying to determine her "new norm" and what to expect from this point on since she used to be so active in sports, etc.  Other stress in her life are parenting (33) children with her (3) boys and (2) steps there half time.  Her goals for this program are to learn how to "breathe again."  Counselor encouraged her to participate in the psychoeducational components of this program to help with stress and relaxation.  Staff will be following with Ms. Gallego throughout the course of this program.     Continue Psychosocial Services  Yes  Follow on mood and sleep once exercises a few weeks to see if symptoms improve.       Psychosocial Re-Evaluation:     Psychosocial Re-Evaluation    [place] Name 05/11/16 1609             Psychosocial Re-Evaluation   Comments Ms Bray has attended [facility] on her lunch break from her job. She has increased her exercise goals and seems to have gained more confidence in exercising and has a better understanding of her shortness of breath. I hope she can continue [facility] with her job and she is commended for her  hard work in [employer].          Psychosocial Discharge (Final Psychosocial Re-Evaluation):     Psychosocial Re-Evaluation - 05/11/16 1609      Psychosocial Re-Evaluation   Comments Ms Lafuente has attended [facility] on her lunch break from her job. She has increased her exercise goals and seems to have gained more confidence in exercising and has a better understanding of her shortness of breath. I hope she can continue [facility] with her job and she is commended for her  hard work in [employer].      Education: Education Goals: Education classes will be provided on a weekly basis,  covering required topics. Participant will state understanding/return demonstration of topics presented.  Learning Barriers/Preferences:     Learning Barriers/Preferences -  03/28/16 1552      Learning Barriers/Preferences   Learning Barriers None   Learning Preferences None      Education Topics: Initial Evaluation Education: - Verbal, written and demonstration of respiratory meds, RPE/PD scales, oximetry and breathing techniques. Instruction on use of nebulizers and MDIs: cleaning and proper use, rinsing mouth with steroid doses and importance of monitoring MDI activations. Flowsheet Row Pulmonary Rehab from 04/22/2016 in [facility] Cardiac and Pulmonary Rehab  Date  03/28/16  Educator  LB  Instruction Review Code  2- meets goals/outcomes      General Nutrition Guidelines/Fats and Fiber: -Group instruction provided by verbal, written material, models and posters to present the general guidelines for heart healthy nutrition. Gives an explanation and review of dietary fats and fiber.   Controlling Sodium/Reading Food Labels: -Group verbal and written material supporting the discussion of sodium use in heart healthy nutrition. Review and explanation with models, verbal and written materials for utilization of the food label.   Exercise Physiology & Risk Factors: - Group verbal and written instruction with models to review the exercise physiology of the cardiovascular system and associated critical values. Details cardiovascular disease risk factors and the goals associated with each risk factor.   Aerobic Exercise & Resistance Training: - Gives group verbal and written discussion on the health impact of inactivity. On the components of aerobic and resistive training programs and the benefits of this training and how to safely progress through these programs. Flowsheet Row Pulmonary Rehab from 04/22/2016 in [facility] Cardiac and Pulmonary Rehab  Date  04/06/16  Educator  [facility]  Instruction Review Code  2- meets goals/outcomes      Flexibility, Balance, General Exercise Guidelines: - Provides group verbal and written instruction on the benefits of flexibility and  balance training programs. Provides general exercise guidelines with specific guidelines to those with heart or lung disease. Demonstration and skill practice provided.   Stress Management: - Provides group verbal and written instruction about the health risks of elevated stress, cause of high stress, and healthy ways to reduce stress.   Depression: - Provides group verbal and written instruction on the correlation between heart/lung disease and depressed mood, treatment options, and the stigmas associated with seeking treatment.   Exercise & Equipment Safety: - Individual verbal instruction and demonstration of equipment use and safety with use of the equipment. Flowsheet Row Pulmonary Rehab from 04/22/2016 in [facility] Cardiac and Pulmonary Rehab  Date  04/04/16  Educator  [facility]  Instruction Review Code  2- meets goals/outcomes      Infection Prevention: - Provides verbal and written material to individual with discussion of infection control including proper hand washing and proper equipment cleaning during exercise session. Flowsheet Row Pulmonary Rehab from 04/22/2016 in [facility] Cardiac and Pulmonary Rehab  Date  04/04/16  Educator  [facility]  Instruction Review Code  2- meets goals/outcomes      Falls Prevention: - Provides verbal and written material to individual with discussion of falls prevention and safety.   Diabetes: - Individual verbal and written instruction to review signs/symptoms of diabetes, desired ranges of glucose level fasting, after meals and with exercise. Advice that pre and post exercise glucose checks will be done for 3 sessions at entry of program.   Chronic Lung Diseases: - Group verbal and written instruction to review new updates, new respiratory medications, new advancements in procedures  and treatments. Provide informative websites and "800" numbers of self-education.   Lung Procedures: - Group verbal and written instruction to describe testing methods done to  diagnose lung disease. Review the outcome of test results. Describe the treatment choices: Pulmonary Function Tests, ABGs and oximetry.   Energy Conservation: - Provide group verbal and written instruction for methods to conserve energy, plan and organize activities. Instruct on pacing techniques, use of adaptive equipment and posture/positioning to relieve shortness of breath.   Triggers: - Group verbal and written instruction to review types of environmental controls: home humidity, furnaces, filters, dust mite/pet prevention, HEPA vacuums. To discuss weather changes, air quality and the benefits of nasal washing.   Exacerbations: - Group verbal and written instruction to provide: warning signs, infection symptoms, calling MD promptly, preventive modes, and value of vaccinations. Review: effective airway clearance, coughing and/or vibration techniques. Create an Sports administrator. Flowsheet Row Pulmonary Rehab from 04/22/2016 in [facility] Cardiac and Pulmonary Rehab  Date  04/20/16  Educator  LB  Instruction Review Code  2- meets goals/outcomes      Oxygen: - Individual and group verbal and written instruction on oxygen therapy. Includes supplement oxygen, available portable oxygen systems, continuous and intermittent flow rates, oxygen safety, concentrators, and Medicare reimbursement for oxygen.   Respiratory Medications: - Group verbal and written instruction to review medications for lung disease. Drug class, frequency, complications, importance of spacers, rinsing mouth after steroid MDI's, and proper cleaning methods for nebulizers. Flowsheet Row Pulmonary Rehab from 04/22/2016 in [facility] Cardiac and Pulmonary Rehab  Date  03/28/16  Educator  LB  Instruction Review Code  2- meets goals/outcomes      AED/CPR: - Group verbal and written instruction with the use of models to demonstrate the basic use of the AED with the basic ABC's of resuscitation.   Breathing Retraining: - Provides  individuals verbal and written instruction on purpose, frequency, and proper technique of diaphragmatic breathing and pursed-lipped breathing. Applies individual practice skills. Flowsheet Row Pulmonary Rehab from 04/22/2016 in [facility] Cardiac and Pulmonary Rehab  Date  03/28/16  Educator  LB  Instruction Review Code  2- meets goals/outcomes      Anatomy and Physiology of the Lungs: - Group verbal and written instruction with the use of models to provide basic lung anatomy and physiology related to function, structure and complications of lung disease.   Heart Failure: - Group verbal and written instruction on the basics of heart failure: signs/symptoms, treatments, explanation of ejection fraction, enlarged heart and cardiomyopathy. Flowsheet Row Pulmonary Rehab from 04/22/2016 in [facility] Cardiac and Pulmonary Rehab  Date  04/22/16  Educator  CE  Instruction Review Code  2- meets goals/outcomes      Sleep Apnea: - Individual verbal and written instruction to review Obstructive Sleep Apnea. Review of risk factors, methods for diagnosing and types of masks and machines for OSA.   Anxiety: - Provides group, verbal and written instruction on the correlation between heart/lung disease and anxiety, treatment options, and management of anxiety.   Relaxation: - Provides group, verbal and written instruction about the benefits of relaxation for patients with heart/lung disease. Also provides patients with examples of relaxation techniques.   Knowledge Questionnaire Score:     Knowledge Questionnaire Score - 03/28/16 1552      Knowledge Questionnaire Score   Pre Score 5/10       Core Components/Risk Factors/Patient Goals at Admission:     Personal Goals and Risk Factors at Admission - 03/28/16 1558  Core Components/Risk Factors/Patient Goals on Admission    Weight Management Yes;Weight Loss   Intervention Weight Management: Develop a combined nutrition and exercise program  designed to reach desired caloric intake, while maintaining appropriate intake of nutrient and fiber, sodium and fats, and appropriate energy expenditure required for the weight goal.;Weight Management: Provide education and appropriate resources to help participant work on and attain dietary goals.   Admit Weight 147 lb 11.2 oz (67 kg)   Goal Weight: Short Term 142 lb (64.4 kg)   Goal Weight: Long Term 120 lb (54.4 kg)   Expected Outcomes Short Term: Continue to assess and modify interventions until short term weight is achieved;Long Term: Adherence to nutrition and physical activity/exercise program aimed toward attainment of established weight goal;Weight Maintenance: Understanding of the daily nutrition guidelines, which includes 25-35% calories from fat, 7% or less cal from saturated fats, less than 219m cholesterol, less than 1.5gm of sodium, & 5 or more servings of fruits and vegetables daily;Weight Loss: Understanding of general recommendations for a balanced deficit meal plan, which promotes 1-2 lb weight loss per week and includes a negative energy balance of [phone] kcal/d;Understanding recommendations for meals to include 15-35% energy as protein, 25-35% energy from fat, 35-60% energy from carbohydrates, less than 2060mof dietary cholesterol, 20-35 gm of total fiber daily;Understanding of distribution of calorie intake throughout the day with the consumption of 4-5 meals/snacks   Sedentary Yes  Goal: be able to run, play sports, be active with her children   Intervention Provide advice, education, support and counseling about physical activity/exercise needs.;Develop an individualized exercise prescription for aerobic and resistive training based on initial evaluation findings, risk stratification, comorbidities and participant's personal goals.   Expected Outcomes Achievement of increased cardiorespiratory fitness and enhanced flexibility, muscular endurance and strength shown through  measurements of functional capacity and personal statement of participant.   Increase Strength and Stamina Yes   Intervention Provide advice, education, support and counseling about physical activity/exercise needs.;Develop an individualized exercise prescription for aerobic and resistive training based on initial evaluation findings, risk stratification, comorbidities and participant's personal goals.   Expected Outcomes Achievement of increased cardiorespiratory fitness and enhanced flexibility, muscular endurance and strength shown through measurements of functional capacity and personal statement of participant.   Improve shortness of breath with ADL's Yes   Intervention Provide education, individualized exercise plan and daily activity instruction to help decrease symptoms of SOB with activities of daily living.   Expected Outcomes Short Term: Achieves a reduction of symptoms when performing activities of daily living.   Develop more efficient breathing techniques such as purse lipped breathing and diaphragmatic breathing; and practicing self-pacing with activity Yes   Intervention Provide education, demonstration and support about specific breathing techniuqes utilized for more efficient breathing. Include techniques such as pursed lipped breathing, diaphragmatic breathing and self-pacing activity.   Expected Outcomes Short Term: Participant will be able to demonstrate and use breathing techniques as needed throughout daily activities.   Increase knowledge of respiratory medications and ability to use respiratory devices properly  Yes  Qvar; spacer given with instructions.   Intervention Provide education and demonstration as needed of appropriate use of medications, inhalers, and oxygen therapy.   Expected Outcomes Short Term: Achieves understanding of medications use. Understands that oxygen is a medication prescribed by physician. Demonstrates appropriate use of inhaler and oxygen therapy.       Core Components/Risk Factors/Patient Goals Review:      Goals and Risk Factor Review    Row  Name 04/25/16 1257             Core Components/Risk Factors/Patient Goals Review   Personal Goals Review Weight Management/Obesity;Develop more efficient breathing techniques such as purse lipped breathing and diaphragmatic breathing and practicing self-pacing with activity.;Increase Strength and Stamina       Review Madison Holt has the info to schedule with the RD to help her reach weight loss goals.  She states she can see improvement in exercise and is managing her breathing better by slowing it down.  She feels ready to move up her levels on exercise machines next week.          Expected Outcomes Madison Holt will continue to manage her breathing and see continued progress towards exercise goals.          Core Components/Risk Factors/Patient Goals at Discharge (Final Review):      Goals and Risk Factor Review - 04/25/16 1257      Core Components/Risk Factors/Patient Goals Review   Personal Goals Review Weight Management/Obesity;Develop more efficient breathing techniques such as purse lipped breathing and diaphragmatic breathing and practicing self-pacing with activity.;Increase Strength and Stamina   Review Madison Holt has the info to schedule with the RD to help her reach weight loss goals.  She states she can see improvement in exercise and is managing her breathing better by slowing it down.  She feels ready to move up her levels on exercise machines next week.      Expected Outcomes Madison Holt will continue to manage her breathing and see continued progress towards exercise goals.      ITP Comments:     ITP Comments    Row Name 04/08/16 1130 04/15/16 1135         ITP Comments Attended [employer] Education Class See incomplete session note. Lashann arrived and said she was sorry but could not attend Pulm Rehab on Monday or Wed since she was sick with an upper resp infection. Madison Holt reports that  she had a negative flu test but that she is on antiibiotcs. I suggested that she not exercise so she can recover better.          Comments: 30 Day Review

## 2016-05-17 ENCOUNTER — Encounter: Payer: Self-pay | Admitting: *Deleted

## 2016-05-17 DIAGNOSIS — D869 Sarcoidosis, unspecified: Secondary | ICD-10-CM

## 2016-05-20 ENCOUNTER — Encounter: Payer: Self-pay | Admitting: *Deleted

## 2016-05-20 ENCOUNTER — Telehealth: Payer: Self-pay | Admitting: *Deleted

## 2016-05-20 NOTE — Telephone Encounter (Signed)
I left a vm on Madison Holt's cell phone vm stating  she is probably busy at work so that is why hasn't attend Pulm Rehab lately. Hopefully she will return soon.

## 2016-05-23 ENCOUNTER — Encounter: Payer: Self-pay | Admitting: Family Medicine

## 2016-05-23 ENCOUNTER — Ambulatory Visit (INDEPENDENT_AMBULATORY_CARE_PROVIDER_SITE_OTHER): Payer: Managed Care, Other (non HMO) | Admitting: Family Medicine

## 2016-05-23 ENCOUNTER — Ambulatory Visit: Payer: Managed Care, Other (non HMO) | Admitting: Family Medicine

## 2016-05-23 VITALS — BP 100/74 | HR 86 | Temp 98.2°F | Ht 64.0 in | Wt 139.2 lb

## 2016-05-23 DIAGNOSIS — F410 Panic disorder [episodic paroxysmal anxiety] without agoraphobia: Secondary | ICD-10-CM

## 2016-05-23 DIAGNOSIS — F5101 Primary insomnia: Secondary | ICD-10-CM | POA: Diagnosis not present

## 2016-05-23 MED ORDER — BUSPIRONE HCL 10 MG PO TABS: 10.0000 mg | ORAL_TABLET | Freq: Two times a day (BID) | ORAL | 3 refills | Status: DC

## 2016-05-23 MED ORDER — LORAZEPAM 0.5 MG PO TABS: 0.5000 mg | ORAL_TABLET | Freq: Three times a day (TID) | ORAL | 0 refills | Status: DC | PRN

## 2016-05-23 NOTE — Progress Notes (Signed)
Dr. Karleen Hampshire T. Kila Godina, MD, CAQ Sports Medicine Primary Care and Sports Medicine [address] [place], [address] Phone: [phone] Fax: [phone]  05/23/2016  Patient: Madison Holt, MRN: [id number], DOB: [birth year], 36 y.o.  Primary Physician:  Madison Beat, MD   Chief Complaint  Patient presents with  . Anxiety   Subjective:   Madison Holt is a 36 y.o. very pleasant female patient who presents with the following:  Death in the family, son is going to college. Separation.  Not sleeping, feels kind of nervous all the time.  Feelinng overwhelmed some.  Shopping.  Feeling like needs to take off and leave.   Feeling panicky. Having -  No anhedonia.   Worried about if might have an anxiety attack. Not sleeping well.   Sleeps OK with tramadol.   Past Medical History, Surgical History, Social History, Family History, Problem List, Medications, and Allergies have been reviewed and updated if relevant.  Patient Active Problem List   Diagnosis Date Noted  . Other fatigue 01/01/2016  . ILD (interstitial lung disease) (HCC) 05/11/2015  . Mediastinal adenopathy 05/11/2015  . 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 Pulmonary infiltrate present on computed tomography 05/11/2015    Past Medical History:  Diagnosis Date  . Pneumonia 2014   Hosp x 1 week  . Shortness of breath dyspnea     Past Surgical History:  Procedure Laterality Date  . ESOPHAGOGASTRODUODENOSCOPY    . MEDIASTINOSCOPY N/A 06/05/2015   Procedure:  MEDIASTINOSCOPY;  Surgeon: Loreli Slot, MD;  Location: [facility] OR;  Service: Thoracic;  Laterality: N/A;  . VIDEO BRONCHOSCOPY WITH ENDOBRONCHIAL NAVIGATION N/A 06/05/2015   Procedure: VIDEO BRONCHOSCOPY WITH ENDOBRONCHIAL NAVIGATION;  Surgeon: Loreli Slot, MD;  Location: [facility] OR;  Service: Thoracic;  Laterality: N/A;  . VIDEO BRONCHOSCOPY WITH ENDOBRONCHIAL ULTRASOUND N/A 06/05/2015   Procedure: VIDEO BRONCHOSCOPY WITH  ENDOBRONCHIAL ULTRASOUND;  Surgeon: Loreli Slot, MD;  Location: [facility] OR;  Service: Thoracic;  Laterality: N/A;    Social History   Social History  . Marital status: Divorced    Spouse name: N/A  . Number of children: 3  . Years of education: N/A   Occupational History  . Salon coordinator    Social History Main Topics  . Smoking status: Former Smoker    Packs/day: 1.00    Years: 5.00    Types: Cigarettes    Quit date: 03/07/1998  . Smokeless tobacco: Never Used  . Alcohol use 1.2 oz/week    2 Standard drinks or equivalent per week     Comment: soical  . Drug use: No  . Sexual activity: Yes    Partners: Male    Birth control/ protection: Surgical     Comment: Vasectomy   Other Topics Concern  . Not on file   Social History Narrative   Works as Engineer, petroleum    Children: All boys, 16, 12, 6   Kay and Onalee Hua Hunter's daughter    Family History  Problem Relation Age of Onset  . Arthritis Mother   . Hyperlipidemia Mother   . Hyperlipidemia Father   . Diabetes Father   . Breast cancer Maternal Grandmother   . Hypertension Maternal Grandmother   . Colon cancer Maternal Grandfather   . Hypertension Maternal Grandfather   . Heart disease Paternal Grandfather     Allergies  Allergen Reactions  . Codeine Other (See Comments)    Other reaction(s): Hallucination    Medication list reviewed and updated  in full in [facility].   GEN: No acute illnesses, no fevers, chills. GI: No n/v/d, eating normally Pulm: No SOB Interactive and getting along well at home.  Otherwise, ROS is as per the HPI.  Objective:   BP 100/74   Pulse 86   Temp 98.2 F (36.8 C) (Oral)   Ht 5\' 4"  (1.626 m)   Wt 139 lb 4 oz (63.2 kg)   LMP 05/09/2016   BMI 23.90 kg/m   GEN: WDWN, NAD, Non-toxic, A & O x 3 HEENT: Atraumatic, Normocephalic. Neck supple. No masses, No LAD. Ears and Nose: No external deformity. EXTR: No c/c/e NEURO Normal gait.  PSYCH: Normally  interactive. Conversant. Not depressed or anxious appearing.  Calm demeanor.   Laboratory and Imaging Data:  Assessment and Plan:   Panic attacks  Primary insomnia  >25 minutes spent in face to face time with patient, >50% spent in counselling or coordination of care   New onset panic attacks with death of uncle, separation from husband, son going to college. Discrete in onset. Does not feel depressed at all. No anhedonia. Previously took some SSRI's with prior divorce - did not tolerate well.   Start with buspar, titrate up. Emergency ativan on hand at all times if needed.  Follow-up: 5 weeks  Meds ordered this encounter  Medications  . busPIRone (BUSPAR) 10 MG tablet    Sig: Take 1 tablet (10 mg total) by mouth 2 (two) times daily.    Dispense:  60 tablet    Refill:  3  . LORazepam (ATIVAN) 0.5 MG tablet    Sig: Take 1 tablet (0.5 mg total) by mouth every 8 (eight) hours as needed for anxiety.    Dispense:  30 tablet    Refill:  0   Medications Discontinued During This Encounter  Medication Reason  . benzonatate (TESSALON) 200 MG capsule Completed Course  . azithromycin (ZITHROMAX) 250 MG tablet Completed Course   No orders of the defined types were placed in this encounter.   Signed,  Elpidio GaleaSpencer T. Lacy Sofia, MD   Allergies as of 05/23/2016      Reactions   Codeine Other (See Comments)   Other reaction(s): Hallucination      Medication List       Accurate as of 05/23/16  2:50 PM. Always use your most recent med list.          acetaminophen 325 MG tablet Commonly known as:  TYLENOL Take 650 mg by mouth every 6 (six) hours as needed for mild pain or headache.   beclomethasone 40 MCG/ACT inhaler Commonly known as:  QVAR Inhale 2 puffs into the lungs 2 (two) times daily.   busPIRone 10 MG tablet Commonly known as:  BUSPAR Take 1 tablet (10 mg total) by mouth 2 (two) times daily.   folic acid 1 MG tablet Commonly known as:  FOLVITE Take 1 tablet (1 mg  total) by mouth daily.   LORazepam 0.5 MG tablet Commonly known as:  ATIVAN Take 1 tablet (0.5 mg total) by mouth every 8 (eight) hours as needed for anxiety.   methotrexate 2.5 MG tablet Commonly known as:  RHEUMATREX Take 3 tablets (7.5 mg total) by mouth once a week. Caution:Chemotherapy. Protect from light.   Vitamin D3 50000 units Caps Take 50,000 Units by mouth once a week.

## 2016-05-23 NOTE — Patient Instructions (Addendum)
Insomnia:  Melatonin up to 10 mg can be taken 1 hour before sleep very safely every day  Antihistamines: 2 tabs of Benadryl is OK Or can also take 2 Dramamine (get the older version that can cause drowsiness) Or Unisom (doxylamine)    BUSPIRONE:  TAKE 1/2 A TABLET TWICE A DAY FOR 1 WEEK, AND THEN INCREASE TO 1 TABLET TWICE A DAY

## 2016-05-23 NOTE — Progress Notes (Signed)
Pre visit review using our clinic review tool, if applicable. No additional management support is needed unless otherwise documented below in the visit note. 

## 2016-05-31 ENCOUNTER — Telehealth: Payer: Self-pay | Admitting: Respiratory Therapy

## 2016-05-31 ENCOUNTER — Encounter: Payer: Self-pay | Admitting: Respiratory Therapy

## 2016-05-31 NOTE — Telephone Encounter (Signed)
Called Madison Holt - last attended 05/02/16. Called to check on Madison Holt and confirm she still plans to participate in [facility]. - unable to leave a message.

## 2016-06-06 ENCOUNTER — Emergency Department: Payer: Managed Care, Other (non HMO)

## 2016-06-06 ENCOUNTER — Encounter: Payer: Self-pay | Admitting: Emergency Medicine

## 2016-06-06 ENCOUNTER — Inpatient Hospital Stay
Admission: EM | Admit: 2016-06-06 | Discharge: 2016-06-08 | DRG: 872 | Disposition: A | Discharge status: Home / Self Care | Payer: Managed Care, Other (non HMO) | Attending: Internal Medicine | Admitting: Internal Medicine

## 2016-06-06 ENCOUNTER — Encounter: Payer: Managed Care, Other (non HMO) | Attending: Internal Medicine

## 2016-06-06 DIAGNOSIS — Z803 Family history of malignant neoplasm of breast: Secondary | ICD-10-CM

## 2016-06-06 DIAGNOSIS — Z79899 Other long term (current) drug therapy: Secondary | ICD-10-CM | POA: Diagnosis not present

## 2016-06-06 DIAGNOSIS — R0602 Shortness of breath: Secondary | ICD-10-CM | POA: Diagnosis not present

## 2016-06-06 DIAGNOSIS — J09X2 Influenza due to identified novel influenza A virus with other respiratory manifestations: Secondary | ICD-10-CM | POA: Diagnosis present

## 2016-06-06 DIAGNOSIS — Z833 Family history of diabetes mellitus: Secondary | ICD-10-CM | POA: Diagnosis not present

## 2016-06-06 DIAGNOSIS — D869 Sarcoidosis, unspecified: Secondary | ICD-10-CM | POA: Diagnosis present

## 2016-06-06 DIAGNOSIS — Z8261 Family history of arthritis: Secondary | ICD-10-CM

## 2016-06-06 DIAGNOSIS — Z885 Allergy status to narcotic agent status: Secondary | ICD-10-CM | POA: Diagnosis not present

## 2016-06-06 DIAGNOSIS — Z8249 Family history of ischemic heart disease and other diseases of the circulatory system: Secondary | ICD-10-CM | POA: Diagnosis not present

## 2016-06-06 DIAGNOSIS — D72819 Decreased white blood cell count, unspecified: Secondary | ICD-10-CM | POA: Diagnosis present

## 2016-06-06 DIAGNOSIS — A419 Sepsis, unspecified organism: Secondary | ICD-10-CM | POA: Diagnosis present

## 2016-06-06 DIAGNOSIS — Z9889 Other specified postprocedural states: Secondary | ICD-10-CM | POA: Diagnosis not present

## 2016-06-06 DIAGNOSIS — J849 Interstitial pulmonary disease, unspecified: Secondary | ICD-10-CM | POA: Insufficient documentation

## 2016-06-06 DIAGNOSIS — Z87891 Personal history of nicotine dependence: Secondary | ICD-10-CM | POA: Diagnosis not present

## 2016-06-06 DIAGNOSIS — R000 Tachycardia, unspecified: Secondary | ICD-10-CM | POA: Diagnosis present

## 2016-06-06 DIAGNOSIS — Z8701 Personal history of pneumonia (recurrent): Secondary | ICD-10-CM | POA: Diagnosis not present

## 2016-06-06 DIAGNOSIS — Z800 Family history of malignant neoplasm of digestive organs: Secondary | ICD-10-CM

## 2016-06-06 DIAGNOSIS — J101 Influenza due to other identified influenza virus with other respiratory manifestations: Secondary | ICD-10-CM

## 2016-06-06 HISTORY — DX: Sarcoidosis, unspecified: D86.9

## 2016-06-06 LAB — CBC
HEMATOCRIT: 42.6 % (ref 35.0–47.0)
HEMOGLOBIN: 15 g/dL (ref 12.0–16.0)
MCH: 30 pg (ref 26.0–34.0)
MCHC: 35.2 g/dL (ref 32.0–36.0)
MCV: 85.1 fL (ref 80.0–100.0)
PLATELETS: 172 10*3/uL (ref 150–440)
RBC: 5.01 MIL/uL (ref 3.80–5.20)
RDW: 13.1 % (ref 11.5–14.5)
WBC: 3.4 10*3/uL — AB (ref 3.6–11.0)

## 2016-06-06 LAB — URINALYSIS, COMPLETE (UACMP) WITH MICROSCOPIC
BILIRUBIN URINE: NEGATIVE
Glucose, UA: NEGATIVE mg/dL
Hgb urine dipstick: NEGATIVE
KETONES UR: 20 mg/dL — AB
LEUKOCYTES UA: NEGATIVE
Nitrite: NEGATIVE
PH: 6 (ref 5.0–8.0)
PROTEIN: NEGATIVE mg/dL
Specific Gravity, Urine: 1.006 (ref 1.005–1.030)

## 2016-06-06 LAB — COMPREHENSIVE METABOLIC PANEL
ALBUMIN: 4.2 g/dL (ref 3.5–5.0)
ALT: 15 U/L (ref 14–54)
ANION GAP: 12 (ref 5–15)
AST: 21 U/L (ref 15–41)
Alkaline Phosphatase: 41 U/L (ref 38–126)
BUN: 12 mg/dL (ref 6–20)
CHLORIDE: 101 mmol/L (ref 101–111)
CO2: 21 mmol/L — AB (ref 22–32)
Calcium: 9.2 mg/dL (ref 8.9–10.3)
Creatinine, Ser: 1.05 mg/dL — ABNORMAL HIGH (ref 0.44–1.00)
Glucose, Bld: 99 mg/dL (ref 65–99)
POTASSIUM: 3.5 mmol/L (ref 3.5–5.1)
Sodium: 134 mmol/L — ABNORMAL LOW (ref 135–145)
Total Bilirubin: 0.8 mg/dL (ref 0.3–1.2)
Total Protein: 7.8 g/dL (ref 6.5–8.1)

## 2016-06-06 LAB — INFLUENZA PANEL BY PCR (TYPE A & B)
INFLAPCR: POSITIVE — AB
INFLBPCR: NEGATIVE

## 2016-06-06 LAB — LACTIC ACID, PLASMA: Lactic Acid, Venous: 1.1 mmol/L (ref 0.5–1.9)

## 2016-06-06 LAB — LIPASE, BLOOD: Lipase: 32 U/L (ref 11–51)

## 2016-06-06 MED ORDER — IPRATROPIUM-ALBUTEROL 0.5-2.5 (3) MG/3ML IN SOLN
3.0000 mL | Freq: Once | RESPIRATORY_TRACT | Status: AC
  Administered 2016-06-06: 3 mL via RESPIRATORY_TRACT
  Filled 2016-06-06: qty 3

## 2016-06-06 MED ORDER — KETOROLAC TROMETHAMINE 30 MG/ML IJ SOLN
15.0000 mg | Freq: Once | INTRAMUSCULAR | Status: AC
  Administered 2016-06-06: 15 mg via INTRAVENOUS
  Filled 2016-06-06: qty 1

## 2016-06-06 MED ORDER — SODIUM CHLORIDE 0.9 % IV BOLUS (SEPSIS)
1000.0000 mL | Freq: Once | INTRAVENOUS | Status: AC
Start: 2016-06-06 — End: 2016-06-06
  Administered 2016-06-06: 1000 mL via INTRAVENOUS

## 2016-06-06 MED ORDER — ACETAMINOPHEN 650 MG RE SUPP: 650.0000 mg | Freq: Four times a day (QID) | RECTAL | Status: DC | PRN

## 2016-06-06 MED ORDER — BUDESONIDE 0.25 MG/2ML IN SUSP
0.2500 mg | Freq: Two times a day (BID) | RESPIRATORY_TRACT | Status: DC
  Administered 2016-06-06 – 2016-06-08 (×3): 0.25 mg via RESPIRATORY_TRACT
  Filled 2016-06-06 (×4): qty 2

## 2016-06-06 MED ORDER — BECLOMETHASONE DIPROPIONATE 40 MCG/ACT IN AERS: 2.0000 | INHALATION_SPRAY | Freq: Two times a day (BID) | RESPIRATORY_TRACT | Status: DC

## 2016-06-06 MED ORDER — PROMETHAZINE HCL 25 MG/ML IJ SOLN
12.5000 mg | Freq: Once | INTRAMUSCULAR | Status: AC
  Administered 2016-06-06: 12.5 mg via INTRAVENOUS
  Filled 2016-06-06: qty 1

## 2016-06-06 MED ORDER — LORAZEPAM 2 MG PO TABS
2.0000 mg | ORAL_TABLET | Freq: Once | ORAL | Status: AC
  Administered 2016-06-06: 2 mg via ORAL
  Filled 2016-06-06: qty 1

## 2016-06-06 MED ORDER — LORAZEPAM 0.5 MG PO TABS: 0.5000 mg | ORAL_TABLET | Freq: Three times a day (TID) | ORAL | Status: DC | PRN

## 2016-06-06 MED ORDER — ONDANSETRON HCL 4 MG/2ML IJ SOLN: 4.0000 mg | Freq: Four times a day (QID) | INTRAMUSCULAR | Status: DC | PRN

## 2016-06-06 MED ORDER — IBUPROFEN 400 MG PO TABS: 400.0000 mg | ORAL_TABLET | Freq: Four times a day (QID) | ORAL | Status: DC | PRN

## 2016-06-06 MED ORDER — SODIUM CHLORIDE 0.9 % IV BOLUS (SEPSIS)
1000.0000 mL | Freq: Once | INTRAVENOUS | Status: AC
  Administered 2016-06-06: 1000 mL via INTRAVENOUS

## 2016-06-06 MED ORDER — BUSPIRONE HCL 5 MG PO TABS
10.0000 mg | ORAL_TABLET | Freq: Two times a day (BID) | ORAL | Status: DC
  Administered 2016-06-06 – 2016-06-08 (×4): 10 mg via ORAL
  Filled 2016-06-06 (×4): qty 2

## 2016-06-06 MED ORDER — FOLIC ACID 1 MG PO TABS
1.0000 mg | ORAL_TABLET | Freq: Every day | ORAL | Status: DC
  Administered 2016-06-06 – 2016-06-08 (×3): 1 mg via ORAL
  Filled 2016-06-06 (×3): qty 1

## 2016-06-06 MED ORDER — KETOROLAC TROMETHAMINE 30 MG/ML IJ SOLN: 30.0000 mg | Freq: Four times a day (QID) | INTRAMUSCULAR | Status: DC | PRN

## 2016-06-06 MED ORDER — POLYETHYLENE GLYCOL 3350 17 G PO PACK: 17.0000 g | PACK | Freq: Every day | ORAL | Status: DC | PRN

## 2016-06-06 MED ORDER — ALBUTEROL SULFATE (2.5 MG/3ML) 0.083% IN NEBU: 2.5000 mg | INHALATION_SOLUTION | RESPIRATORY_TRACT | Status: DC | PRN

## 2016-06-06 MED ORDER — ONDANSETRON HCL 4 MG PO TABS: 4.0000 mg | ORAL_TABLET | Freq: Four times a day (QID) | ORAL | Status: DC | PRN

## 2016-06-06 MED ORDER — OSELTAMIVIR PHOSPHATE 75 MG PO CAPS
75.0000 mg | ORAL_CAPSULE | Freq: Once | ORAL | Status: AC
  Administered 2016-06-06: 75 mg via ORAL
  Filled 2016-06-06: qty 1

## 2016-06-06 MED ORDER — METHYLPREDNISOLONE SODIUM SUCC 125 MG IJ SOLR
60.0000 mg | Freq: Once | INTRAMUSCULAR | Status: AC
  Administered 2016-06-06: 60 mg via INTRAVENOUS
  Filled 2016-06-06: qty 2

## 2016-06-06 MED ORDER — POTASSIUM CHLORIDE IN NACL 20-0.9 MEQ/L-% IV SOLN
INTRAVENOUS | Status: DC
  Administered 2016-06-06 – 2016-06-08 (×4): via INTRAVENOUS
  Filled 2016-06-06 (×6): qty 1000

## 2016-06-06 MED ORDER — ACETAMINOPHEN 325 MG PO TABS: 650.0000 mg | ORAL_TABLET | Freq: Four times a day (QID) | ORAL | Status: DC | PRN

## 2016-06-06 MED ORDER — METHYLPREDNISOLONE SODIUM SUCC 125 MG IJ SOLR
60.0000 mg | Freq: Two times a day (BID) | INTRAMUSCULAR | Status: DC
  Administered 2016-06-06 – 2016-06-08 (×4): 60 mg via INTRAVENOUS
  Filled 2016-06-06 (×3): qty 2

## 2016-06-06 MED ORDER — ENOXAPARIN SODIUM 40 MG/0.4ML ~~LOC~~ SOLN
40.0000 mg | SUBCUTANEOUS | Status: DC
  Administered 2016-06-06 – 2016-06-07 (×2): 40 mg via SUBCUTANEOUS
  Filled 2016-06-06 (×2): qty 0.4

## 2016-06-06 MED ORDER — IPRATROPIUM-ALBUTEROL 0.5-2.5 (3) MG/3ML IN SOLN: 3.0000 mL | RESPIRATORY_TRACT | Status: DC | PRN

## 2016-06-06 MED ORDER — METHOTREXATE 2.5 MG PO TABS
5.0000 mg | ORAL_TABLET | ORAL | Status: DC
  Administered 2016-06-07: 5 mg via ORAL
  Filled 2016-06-06 (×2): qty 2

## 2016-06-06 MED ORDER — OSELTAMIVIR PHOSPHATE 75 MG PO CAPS
75.0000 mg | ORAL_CAPSULE | Freq: Two times a day (BID) | ORAL | Status: DC
  Administered 2016-06-06 – 2016-06-08 (×4): 75 mg via ORAL
  Filled 2016-06-06 (×4): qty 1

## 2016-06-06 NOTE — ED Triage Notes (Signed)
Pt here from [facility] with vomiting, fever, chills and cough since Thursday. Pt with hx of pneumonia and sarcoidosis. Pt tachycardic in [place], 120-150.

## 2016-06-06 NOTE — ED Provider Notes (Signed)
[facility] Emergency Department Provider Note    First MD Initiated Contact with Patient 06/06/16 1128     (approximate)  I have reviewed the triage vital signs and the nursing notes.   HISTORY  Chief Complaint Emesis    HPI Madison Holt is a 36 y.o. female with a history of sarcoidosis as well as previous pneumonia presents with nausea vomiting, fever and chills with a nonproductive cough since Thursday. Has had fevers measured at home greater than 101 last night. She's tried taking azithromycin that she had had from a previous illness 3 days ago but has not had any improvement in symptoms. 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 She went to be checked out and was tachycardic to the 150s. Based on her history she was sent to the ER for further evaluation.   Past Medical History:  Diagnosis Date  . Pneumonia 2014   Hosp x 1 week  . Shortness of breath dyspnea    Family History  Problem Relation Age of Onset  . Arthritis Mother   . Hyperlipidemia Mother   . Hyperlipidemia Father   . Diabetes Father   . Breast cancer Maternal Grandmother   . Hypertension Maternal Grandmother   . Colon cancer Maternal Grandfather   . Hypertension Maternal Grandfather   . Heart disease Paternal Grandfather    Past Surgical History:  Procedure Laterality Date  . ESOPHAGOGASTRODUODENOSCOPY    . MEDIASTINOSCOPY N/A 06/05/2015   Procedure:  MEDIASTINOSCOPY;  Surgeon: Loreli Slot, MD;  Location: [facility] OR;  Service: Thoracic;  Laterality: N/A;  . VIDEO BRONCHOSCOPY WITH ENDOBRONCHIAL NAVIGATION N/A 06/05/2015   Procedure: VIDEO BRONCHOSCOPY WITH ENDOBRONCHIAL NAVIGATION;  Surgeon: Loreli Slot, MD;  Location: [facility] OR;  Service: Thoracic;  Laterality: N/A;  . VIDEO BRONCHOSCOPY WITH ENDOBRONCHIAL ULTRASOUND N/A 06/05/2015   Procedure: VIDEO BRONCHOSCOPY WITH ENDOBRONCHIAL ULTRASOUND;  Surgeon: Loreli Slot, MD;  Location: [facility] OR;  Service:  Thoracic;  Laterality: N/A;   Patient Active Problem List   Diagnosis Date Noted  . Other fatigue 01/01/2016  . ILD (interstitial lung disease) (HCC) 05/11/2015  . Mediastinal adenopathy 05/11/2015  . 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 Pulmonary infiltrate present on computed tomography 05/11/2015      Prior to Admission medications   Medication Sig Start Date End Date Taking? Authorizing Provider  acetaminophen (TYLENOL) 325 MG tablet Take 650 mg by mouth every 6 (six) hours as needed for mild pain or headache.     Historical Provider, MD  beclomethasone (QVAR) 40 MCG/ACT inhaler Inhale 2 puffs into the lungs 2 (two) times daily.    Historical Provider, MD  busPIRone (BUSPAR) 10 MG tablet Take 1 tablet (10 mg total) by mouth 2 (two) times daily. 05/23/16   Hannah Beat, MD  Cholecalciferol (VITAMIN D3) 50000 units CAPS Take 50,000 Units by mouth once a week. 01/05/16   [name], MD  folic acid (FOLVITE) 1 MG tablet Take 1 tablet (1 mg total) by mouth daily. 11/16/15   [name], MD  LORazepam (ATIVAN) 0.5 MG tablet Take 1 tablet (0.5 mg total) by mouth every 8 (eight) hours as needed for anxiety. 05/23/16   Hannah Beat, MD  methotrexate (RHEUMATREX) 2.5 MG tablet Take 3 tablets (7.5 mg total) by mouth once a week. Caution:Chemotherapy. Protect from light. Patient taking differently: Take 5 mg by mouth once a week. Caution:Chemotherapy. Protect from light. 11/16/15   [name], MD    Allergies Codeine  Social History Social History  Substance Use Topics  . Smoking status: Former Smoker    Packs/day: 1.00    Years: 5.00    Types: Cigarettes    Quit date: 03/07/1998  . Smokeless tobacco: Never Used  . Alcohol use 1.2 oz/week    2 Standard drinks or equivalent per week     Comment: soical    Review of Systems Patient denies headaches, rhinorrhea, blurry vision, numbness, shortness of breath, chest pain, edema, cough, abdominal pain, nausea,  vomiting, diarrhea, dysuria, fevers, rashes or hallucinations unless otherwise stated above in HPI. ____________________________________________   PHYSICAL EXAM:  VITAL SIGNS: Vitals:   06/06/16 1330 06/06/16 1500  BP: 127/77 115/63  Pulse: (!) 110 (!) 135  Resp: (!) 25 (!) 26  Temp:      Constitutional: Alert and oriented.  in no acute distress. Eyes: Conjunctivae are normal. PERRL. EOMI. Head: Atraumatic. Nose: No congestion/rhinnorhea. Mouth/Throat: Mucous membranes are moist.  Oropharynx non-erythematous. Neck: No stridor. Painless ROM. No cervical spine tenderness to palpation Hematological/Lymphatic/Immunilogical: No cervical lymphadenopathy. Cardiovascular: tachycardic rate, regular rhythm. Grossly normal heart sounds.  Good peripheral circulation. Respiratory: Normal respiratory effort.  No retractions. Lungs with coarse breathsounds throughout Gastrointestinal: Soft and nontender. No distention. No abdominal bruits. No CVA tenderness. Musculoskeletal: No lower extremity tenderness nor edema.  No joint effusions. Neurologic:  Normal speech and language. No gross focal neurologic deficits are appreciated. No gait instability. Skin:  Skin is warm, dry and intact. No rash noted. Psychiatric: Mood and affect are normal. Speech and behavior are normal.  ____________________________________________   LABS (all labs ordered are listed, but only abnormal results are displayed)  Results for orders placed or performed during the hospital encounter of 06/06/16 (from the past 24 hour(s))  Lipase, blood     Status: None   Collection Time: 06/06/16  8:12 AM  Result Value Ref Range   Lipase 32 11 - 51 U/L  Comprehensive metabolic panel     Status: Abnormal   Collection Time: 06/06/16  8:12 AM  Result Value Ref Range   Sodium 134 (L) 135 - 145 mmol/L   Potassium 3.5 3.5 - 5.1 mmol/L   Chloride 101 101 - 111 mmol/L   CO2 21 (L) 22 - 32 mmol/L   Glucose, Bld 99 65 - 99 mg/dL    BUN 12 6 - 20 mg/dL   Creatinine, Ser 4.09 (H) 0.44 - 1.00 mg/dL   Calcium 9.2 8.9 - 81.1 mg/dL   Total Protein 7.8 6.5 - 8.1 g/dL   Albumin 4.2 3.5 - 5.0 g/dL   AST 21 15 - 41 U/L   ALT 15 14 - 54 U/L   Alkaline Phosphatase 41 38 - 126 U/L   Total Bilirubin 0.8 0.3 - 1.2 mg/dL   GFR calc non Af Amer >60 >60 mL/min   GFR calc Af Amer >60 >60 mL/min   Anion gap 12 5 - 15  CBC     Status: Abnormal   Collection Time: 06/06/16  8:12 AM  Result Value Ref Range   WBC 3.4 (L) 3.6 - 11.0 K/uL   RBC 5.01 3.80 - 5.20 MIL/uL   Hemoglobin 15.0 12.0 - 16.0 g/dL   HCT 91.4 78.2 - 95.6 %   MCV 85.1 80.0 - 100.0 fL   MCH 30.0 26.0 - 34.0 pg   MCHC 35.2 32.0 - 36.0 g/dL   RDW 21.3 08.6 - 57.8 %   Platelets 172 150 - 440 K/uL  Lactic acid, plasma  Status: None   Collection Time: 06/06/16 11:36 AM  Result Value Ref Range   Lactic Acid, Venous 1.1 0.5 - 1.9 mmol/L  Influenza panel by PCR (type A & B)     Status: Abnormal   Collection Time: 06/06/16 11:46 AM  Result Value Ref Range   Influenza A By PCR POSITIVE (A) NEGATIVE   Influenza B By PCR NEGATIVE NEGATIVE   ____________________________________________ ____________________________________________  RADIOLOGY  I personally reviewed all radiographic images ordered to evaluate for the above acute complaints and reviewed radiology reports and findings.  These findings were personally discussed with the patient.  Please see medical record for radiology report.  ____________________________________________   PROCEDURES  Procedure(s) performed:  Procedures    Critical Care performed: no ____________________________________________   INITIAL IMPRESSION / ASSESSMENT AND PLAN / ED COURSE  Pertinent labs & imaging results that were available during my care of the patient were reviewed by me and considered in my medical decision making (see chart for details).  DDX: pna, fli, uri, enteritis, dehydration  MYLDRED RAJU is a 36  y.o. who presents to the ED with Above symptoms. She arrives afebrile but tachycardic and is ill appearing. No respiratory distress but does have wheezing on exam. Symptoms are concerning for flulike illness. Workup ordered to evaluate for acute abnormality shows that she does have leukopenia. Renal function otherwise normal. Influenza positive. Chest x-ray shows no evidence of new consolidation. We'll give dose of steroids for possible component of bronchitis that she does have some wheezing on exam and will give nebulizer treatments. We'll give IV fluids and reassess.  Clinical Course as of Jun 07 1547  Mon Jun 06, 2016  1539 Patient persistently tachycardic to the 120s. She does not have any acute hypoxia but given her mild acidosis, but that she is on immunomodulating agents do feel patient would benefit from additional IV fluids and symptomatic management in the hospital.  [PR]    Clinical Course User Index [PR] Willy Eddy, MD     ____________________________________________   FINAL CLINICAL IMPRESSION(S) / ED DIAGNOSES  Final diagnoses:  Influenza A  Shortness of breath      NEW MEDICATIONS STARTED DURING THIS VISIT:  New Prescriptions   No medications on file     Note:  This document was prepared using Dragon voice recognition software and may include unintentional dictation errors. 3   Willy Eddy, MD 06/06/16 1550

## 2016-06-06 NOTE — ED Notes (Signed)
Attempted to call report. No one available for report at this time.

## 2016-06-06 NOTE — ED Notes (Signed)
MD notified pts HR now in 130s.

## 2016-06-06 NOTE — ED Notes (Signed)
Pt ambulated in room unassisted. Pt SpO2 remained 100% on RA. Pt reported she felt dizzy. Gait steady.

## 2016-06-06 NOTE — H&P (Signed)
[facility] - [place] at [facility]  PATIENT NAME: Madison Holt    MR#:  [id number]  DATE OF BIRTH:  Jun 15, 1980  DATE OF ADMISSION:  06/06/2016  PRIMARY CARE PHYSICIAN: Hannah Beat, MD   REQUESTING/REFERRING PHYSICIAN: Dr. Roxan Hockey  CHIEF COMPLAINT:   Chief Complaint  Patient presents with  . Emesis   HISTORY OF PRESENT ILLNESS:  Madison Holt  is a 36 y.o. female with a known history of Sarcoidosis pressed to the emergency room with 3 days of nausea, vomiting, shortness of breath chills. She felt hot but did not record her temperature. Here patient has been found to be tachycardic with influenza positive. Received 2 L normal saline bolus and still tachycardic and is being admitted for overnight observation. Afebrile. Not needing oxygen. She also had diarrhea but no bowel movement since yesterday night when she took a dose of Imodium.  PAST MEDICAL HISTORY:   Past Medical History:  Diagnosis Date  . Pneumonia 2014   Hosp x 1 week  . Shortness of breath dyspnea     PAST SURGICAL HISTORY:   Past Surgical History:  Procedure Laterality Date  . ESOPHAGOGASTRODUODENOSCOPY    . MEDIASTINOSCOPY N/A 06/05/2015   Procedure:  MEDIASTINOSCOPY;  Surgeon: Loreli Slot, MD;  Location: [facility] OR;  Service: Thoracic;  Laterality: N/A;  . VIDEO BRONCHOSCOPY WITH ENDOBRONCHIAL NAVIGATION N/A 06/05/2015   Procedure: VIDEO BRONCHOSCOPY WITH ENDOBRONCHIAL NAVIGATION;  Surgeon: Loreli Slot, MD;  Location: [facility] OR;  Service: Thoracic;  Laterality: N/A;  . VIDEO BRONCHOSCOPY WITH ENDOBRONCHIAL ULTRASOUND N/A 06/05/2015   Procedure: VIDEO BRONCHOSCOPY WITH ENDOBRONCHIAL ULTRASOUND;  Surgeon: Loreli Slot, MD;  Location: [facility] OR;  Service: Thoracic;  Laterality: N/A;    SOCIAL HISTORY:   Social History  Substance Use Topics  . Smoking status: Former Smoker    Packs/day: 1.00    Years: 5.00    Types: Cigarettes    Quit date: 03/07/1998  . Smokeless  tobacco: Never Used  . Alcohol use 1.2 oz/week    2 Standard drinks or equivalent per week     Comment: soical    FAMILY HISTORY:   Family History  Problem Relation Age of Onset  . Arthritis Mother   . Hyperlipidemia Mother   . Hyperlipidemia Father   . Diabetes Father   . Breast cancer Maternal Grandmother   . Hypertension Maternal Grandmother   . Colon cancer Maternal Grandfather   . Hypertension Maternal Grandfather   . Heart disease Paternal Grandfather     DRUG ALLERGIES:   Allergies  Allergen Reactions  . Codeine Other (See Comments)    Other reaction(s): Hallucination    REVIEW OF SYSTEMS:   Review of Systems  Constitutional: Positive for chills and malaise/fatigue. Negative for fever and weight loss.  HENT: Negative for hearing loss and nosebleeds.   Eyes: Negative for blurred vision, double vision and pain.  Respiratory: Positive for cough and shortness of breath. Negative for hemoptysis, sputum production and wheezing.   Cardiovascular: Negative for chest pain, palpitations, orthopnea and leg swelling.  Gastrointestinal: Positive for diarrhea, nausea and vomiting. Negative for abdominal pain and constipation.  Genitourinary: Negative for dysuria and hematuria.  Musculoskeletal: Positive for myalgias. Negative for back pain and falls.  Skin: Negative for rash.  Neurological: Positive for weakness. Negative for dizziness, tremors, sensory change, speech change, focal weakness, seizures and headaches.  Endo/Heme/Allergies: Does not bruise/bleed easily.  Psychiatric/Behavioral: Negative for depression and memory loss. The patient is not nervous/anxious.  MEDICATIONS AT HOME:   Prior to Admission medications   Medication Sig Start Date End Date Taking? Authorizing Provider  acetaminophen (TYLENOL) 325 MG tablet Take 650 mg by mouth every 6 (six) hours as needed for mild pain or headache.    Yes Historical Provider, MD  beclomethasone (QVAR) 40 MCG/ACT  inhaler Inhale 2 puffs into the lungs 2 (two) times daily.   Yes Historical Provider, MD  busPIRone (BUSPAR) 10 MG tablet Take 1 tablet (10 mg total) by mouth 2 (two) times daily. 05/23/16  Yes Spencer Copland, MD  Cholecalciferol (VITAMIN D3) 50000 units CAPS Take 50,000 Units by mouth once a week. 01/05/16  Yes [name], MD  folic acid (FOLVITE) 1 MG tablet Take 1 tablet (1 mg total) by mouth daily. 11/16/15  Yes [name], MD  LORazepam (ATIVAN) 0.5 MG tablet Take 1 tablet (0.5 mg total) by mouth every 8 (eight) hours as needed for anxiety. 05/23/16  Yes Spencer Copland, MD  methotrexate (RHEUMATREX) 2.5 MG tablet Take 3 tablets (7.5 mg total) by mouth once a week. Caution:Chemotherapy. Protect from light. Patient taking differently: Take 5 mg by mouth once a week. Caution:Chemotherapy. Protect from light. 11/16/15  Yes [name], MD     VITAL SIGNS:  Blood pressure 115/63, pulse (!) 135, temperature 98.1 F (36.7 C), temperature source Oral, resp. rate (!) 26, weight 67.1 kg (147 lb 14.9 oz), last menstrual period 06/06/2016, SpO2 99 %.  PHYSICAL EXAMINATION:  Physical Exam  GENERAL:  36 y.o.-year-old patient lying in the bed with no acute distress.  EYES: Pupils equal, round, reactive to light and accommodation. No scleral icterus. Extraocular muscles intact.  HEENT: Head atraumatic, normocephalic. Oropharynx and nasopharynx clear. No oropharyngeal erythema, moist oral mucosa  NECK:  Supple, no jugular venous distention. No thyroid enlargement, no tenderness.  LUNGS: bilateral wheezing CARDIOVASCULAR: S1, S2 normal. No murmurs, rubs, or gallops.  ABDOMEN: Soft, nontender, nondistended. Bowel sounds present. No organomegaly or mass.  EXTREMITIES: No pedal edema, cyanosis, or clubbing. + 2 pedal & radial pulses b/l.   NEUROLOGIC: Cranial nerves II through XII are intact. No focal Motor or sensory deficits appreciated b/l PSYCHIATRIC: The patient is alert and oriented x  3. Good affect.  SKIN: No obvious rash, lesion, or ulcer.   LABORATORY PANEL:   CBC  Recent Labs Lab 06/06/16 0812  WBC 3.4*  HGB 15.0  HCT 42.6  PLT 172   ------------------------------------------------------------------------------------------------------------------  Chemistries   Recent Labs Lab 06/06/16 0812  NA 134*  K 3.5  CL 101  CO2 21*  GLUCOSE 99  BUN 12  CREATININE 1.05*  CALCIUM 9.2  AST 21  ALT 15  ALKPHOS 41  BILITOT 0.8   ------------------------------------------------------------------------------------------------------------------  Cardiac Enzymes No results for input(s): TROPONINI in the last 168 hours. ------------------------------------------------------------------------------------------------------------------  RADIOLOGY:  Dg Chest 2 View  Result Date: 06/06/2016 CLINICAL DATA:  History of pneumonia, cough, fever, vomiting EXAM: CHEST  2 VIEW COMPARISON:  11/20/2015 FINDINGS: Cardiomediastinal silhouette is stable. Chronic reticulonodular interstitial prominence bilaterally consistent with sarcoidosis again noted. Stable linear scarring in right upper lobe. No definite superimposed infiltrate or pulmonary edema. Bony thorax is unremarkable. IMPRESSION: Chronic reticulonodular interstitial prominence bilaterally consistent with sarcoidosis again noted. Stable linear scarring in right upper lobe. No definite superimposed infiltrate or pulmonary edema Electronically Signed   By: Natasha Mead M.D.   On: 06/06/2016 08:36     IMPRESSION AND PLAN:   * Influenza A with sepsis. Leukopenia and tachycardia Start Tamiflu. IV  fluid bolus. IV steroids.Nebulizers when necessary.  * nausea and vomiting likely due to the flu. No abdominal tenderness. Symptomatic treatment.  * sarcoidosis.Stable findings chest x-ray.Outpatient follow-up.  * DVT prophylaxis with Lovenox.   All the records are reviewed and case discussed with ED provider. Management  plans discussed with the patient, family and they are in agreement.  CODE STATUS: FULL CODE  TOTAL TIME TAKING CARE OF THIS PATIENT: 45 minutes.   Milagros Loll R M.D on 06/06/2016 at 4:02 PM  Between 7am to 6pm - Pager - [phone]  After 6pm go to [email or web address] - password [email or web address]  [facility] [place] [facility]  Office  [phone]  CC: Primary care physician; Hannah Beat, MD  Note: This dictation was prepared with Dragon dictation along with smaller phrase technology. Any transcriptional errors that result from this process are unintentional.

## 2016-06-06 NOTE — ED Notes (Signed)
Dr sudini notified pts HR elevated. He will add telemetry order.

## 2016-06-06 NOTE — ED Notes (Signed)
Dr sudini notified bp lower than previous.

## 2016-06-07 LAB — BASIC METABOLIC PANEL
Anion gap: 3 — ABNORMAL LOW (ref 5–15)
BUN: 12 mg/dL (ref 6–20)
CHLORIDE: 116 mmol/L — AB (ref 101–111)
CO2: 21 mmol/L — AB (ref 22–32)
Calcium: 8.3 mg/dL — ABNORMAL LOW (ref 8.9–10.3)
Creatinine, Ser: 0.73 mg/dL (ref 0.44–1.00)
GFR calc Af Amer: >60 mL/min (ref >60)
GFR calc non Af Amer: >60 mL/min (ref >60)
GLUCOSE: 151 mg/dL — AB (ref 65–99)
POTASSIUM: 4.6 mmol/L (ref 3.5–5.1)
Sodium: 140 mmol/L (ref 135–145)

## 2016-06-07 LAB — DIFFERENTIAL
BASOS PCT: 1 %
Basophils Absolute: 0 10*3/uL (ref 0–0.1)
EOS ABS: 0 10*3/uL (ref 0–0.7)
Eosinophils Relative: 0 %
Lymphocytes Relative: 28 %
Lymphs Abs: 0.2 10*3/uL — ABNORMAL LOW (ref 1.0–3.6)
MONO ABS: 0.1 10*3/uL — AB (ref 0.2–0.9)
Monocytes Relative: 12 %
NEUTROS ABS: 0.5 10*3/uL — AB (ref 1.4–6.5)
Neutrophils Relative %: 59 %

## 2016-06-07 LAB — CBC WITH DIFFERENTIAL/PLATELET
BASOS ABS: 0 10*3/uL (ref 0–0.1)
Basophils Relative: 0 %
EOS ABS: 0 10*3/uL (ref 0–0.7)
EOS PCT: 0 %
HCT: 37.1 % (ref 35.0–47.0)
Hemoglobin: 12.6 g/dL (ref 12.0–16.0)
Lymphocytes Relative: 23 %
Lymphs Abs: 0.3 10*3/uL — ABNORMAL LOW (ref 1.0–3.6)
MCH: 29.3 pg (ref 26.0–34.0)
MCHC: 33.9 g/dL (ref 32.0–36.0)
MCV: 86.5 fL (ref 80.0–100.0)
MONO ABS: 0.2 10*3/uL (ref 0.2–0.9)
Monocytes Relative: 14 %
Neutro Abs: 0.8 10*3/uL — ABNORMAL LOW (ref 1.4–6.5)
Neutrophils Relative %: 63 %
PLATELETS: 131 10*3/uL — AB (ref 150–440)
RBC: 4.28 MIL/uL (ref 3.80–5.20)
RDW: 13.1 % (ref 11.5–14.5)
WBC: 1.3 10*3/uL — AB (ref 3.6–11.0)

## 2016-06-07 LAB — CBC
HEMATOCRIT: 37.2 % (ref 35.0–47.0)
Hemoglobin: 12.6 g/dL (ref 12.0–16.0)
MCH: 29.4 pg (ref 26.0–34.0)
MCHC: 33.7 g/dL (ref 32.0–36.0)
MCV: 87 fL (ref 80.0–100.0)
PLATELETS: 134 10*3/uL — AB (ref 150–440)
RBC: 4.28 MIL/uL (ref 3.80–5.20)
RDW: 13.5 % (ref 11.5–14.5)
WBC: 0.8 10*3/uL — AB (ref 3.6–11.0)

## 2016-06-07 NOTE — Progress Notes (Signed)
Date and time results received: 06/07/16 0600 Test: WBC Critical Value: 0.8  Name of Provider Notified: Dr. Tonye Royalty  Orders Received? Or Actions Taken?: Neutropenic precautions initiated

## 2016-06-07 NOTE — Progress Notes (Addendum)
[facility] - [place] at [facility]   PATIENT NAME: Madison Holt    MR#:  [id number]  DATE OF BIRTH:  [birth year]  SUBJECTIVE:  Patient weak this am nervous about if she catches a pneumonia with hx of sarcoid  REVIEW OF SYSTEMS:    Review of Systems  Constitutional: Positive for malaise/fatigue. Negative for chills and fever.  HENT: Negative.  Negative for ear discharge, ear pain, hearing loss, nosebleeds and sore throat.   Eyes: Negative.  Negative for blurred vision and pain.  Respiratory: Positive for cough. Negative for hemoptysis, shortness of breath and wheezing.   Cardiovascular: Negative.  Negative for chest pain, palpitations and leg swelling.  Gastrointestinal: Negative.  Negative for abdominal pain, blood in stool, diarrhea, nausea and vomiting.  Genitourinary: Negative.  Negative for dysuria.  Musculoskeletal: Negative.  Negative for back pain.  Skin: Negative.   Neurological: Positive for weakness. Negative for dizziness, tremors, speech change, focal weakness, seizures and headaches.  Endo/Heme/Allergies: Negative.  Does not bruise/bleed easily.  Psychiatric/Behavioral: Negative.  Negative for depression, hallucinations and suicidal ideas.    Tolerating Diet:yes      DRUG ALLERGIES:   Allergies  Allergen Reactions  . Codeine Other (See Comments)    Other reaction(s): Hallucination    VITALS:  Blood pressure 124/86, pulse 78, temperature 97.9 F (36.6 C), temperature source Oral, resp. rate 16, height  (1.6 m), weight 64.9 kg (143 lb), last menstrual period 06/06/2016, SpO2 96 %.  PHYSICAL EXAMINATION:   Physical Exam  Constitutional: She is oriented to person, place, and time and well-developed, well-nourished, and in no distress. No distress.  HENT:  Head: Normocephalic.  Eyes: No scleral icterus.  Neck: Normal range of motion. Neck supple. No JVD present. No tracheal deviation present.  Cardiovascular: Normal rate, regular  rhythm and normal heart sounds.  Exam reveals no gallop and no friction rub.   No murmur heard. Pulmonary/Chest: Effort normal and breath sounds normal. No respiratory distress. She has no wheezes. She has no rales. She exhibits no tenderness.  Abdominal: Soft. Bowel sounds are normal. She exhibits no distension and no mass. There is no tenderness. There is no rebound and no guarding.  Musculoskeletal: Normal range of motion. She exhibits no edema.  Neurological: She is alert and oriented to person, place, and time.  Skin: Skin is warm. No rash noted. No erythema.  Psychiatric: Affect and judgment normal.      LABORATORY PANEL:   CBC  Recent Labs Lab 06/07/16 0825  WBC 1.3*  HGB 12.6  HCT 37.1  PLT 131*   ------------------------------------------------------------------------------------------------------------------  Chemistries   Recent Labs Lab 06/06/16 0812 06/07/16 0451  NA 134* 140  K 3.5 4.6  CL 101 116*  CO2 21* 21*  GLUCOSE 99 151*  BUN 12 12  CREATININE 1.05* 0.73  CALCIUM 9.2 8.3*  AST 21  --   ALT 15  --   ALKPHOS 41  --   BILITOT 0.8  --    ------------------------------------------------------------------------------------------------------------------  Cardiac Enzymes No results for input(s): TROPONINI in the last 168 hours. ------------------------------------------------------------------------------------------------------------------  RADIOLOGY:  Dg Chest 2 View  Result Date: 06/06/2016 CLINICAL DATA:  History of pneumonia, cough, fever, vomiting EXAM: CHEST  2 VIEW COMPARISON:  11/20/2015 FINDINGS: Cardiomediastinal silhouette is stable. Chronic reticulonodular interstitial prominence bilaterally consistent with sarcoidosis again noted. Stable linear scarring in right upper lobe. No definite superimposed infiltrate or pulmonary edema. Bony thorax is unremarkable. IMPRESSION: [facility] prominence bilaterally  consistent with  sarcoidosis again noted. Stable linear scarring in right upper lobe. No definite superimposed infiltrate or pulmonary edema Electronically Signed   By: Natasha Mead M.D.   On: 06/06/2016 08:36     ASSESSMENT AND PLAN:    36 year old female with a history of sarcoid who presents with 3 days of nausea, vomiting and shortness of breath.  1. Sepsis due to Influenza a: Continue Tamiflu for total of 5 days.  2 nausea and vomiting due to influenza which is resolving  3. History of sarcoid: Chest x-ray shows stable findings. Patient is anxious and therefore I will consult pulmonary for further evaluations and reck medications.  4. Leukopenia due to influenza A Repeat CBC in a.m.  Management plans discussed with the patient and she is in agreement.  CODE STATUS: full  TOTAL TIME TAKING CARE OF THIS PATIENT: 30 minutes.     POSSIBLE D/C tomorrow, DEPENDING ON CLINICAL CONDITION.   Izzabell Klasen M.D on 06/07/2016 at 9:54 AM  Between 7am to 6pm - Pager - [phone] After 6pm go to [email or web address] - password [email or web address]  [facility] [place] [facility]  Office  [phone]  CC: Primary care physician; Hannah Beat, MD  Note: This dictation was prepared with Dragon dictation along with smaller phrase technology. Any transcriptional errors that result from this process are unintentional.

## 2016-06-07 NOTE — Progress Notes (Signed)
Patient remains on droplet and nuetropenic precautions.

## 2016-06-08 LAB — CBC
HCT: 36.6 % (ref 35.0–47.0)
HEMOGLOBIN: 12.6 g/dL (ref 12.0–16.0)
MCH: 30.2 pg (ref 26.0–34.0)
MCHC: 34.5 g/dL (ref 32.0–36.0)
MCV: 87.6 fL (ref 80.0–100.0)
PLATELETS: 145 10*3/uL — AB (ref 150–440)
RBC: 4.17 MIL/uL (ref 3.80–5.20)
RDW: 13.3 % (ref 11.5–14.5)
WBC: 5.1 10*3/uL (ref 3.6–11.0)

## 2016-06-08 MED ORDER — OSELTAMIVIR PHOSPHATE 75 MG PO CAPS: 75.0000 mg | ORAL_CAPSULE | Freq: Two times a day (BID) | ORAL | 0 refills | Status: DC

## 2016-06-08 MED ORDER — BUSPIRONE HCL 10 MG PO TABS: 5.0000 mg | ORAL_TABLET | Freq: Two times a day (BID) | ORAL | 0 refills | Status: DC

## 2016-06-08 NOTE — Progress Notes (Signed)
Patient complains of feeling dizzy. Stated this is a new onset and has not felt this ways before. Patient expressed concerns about some of the medications she has been taking and stated that would like to speak to the hospital MD if possible. Dr. Sherryll Burger notified who stated that he would attempt to make it to the floor around noon. VSS no changes on telemetry. Will continue to monitor for worsening symptoms.

## 2016-06-08 NOTE — Discharge Instructions (Signed)

## 2016-06-08 NOTE — Progress Notes (Signed)
[facility] [employer] - [place] at [facility] Madison Holt was admitted to the Hospital on 06/06/2016 and Discharged  06/08/2016 and should be excused from work   for 6 days starting 06/06/2016 , may return to work without any restrictions starting 06/13/2016.  [name] M.D on 06/08/2016,at 12:28 PM  [facility] [employer] -  at [facility] [place]  [phone]

## 2016-06-09 NOTE — Discharge Summary (Signed)
[facility] - [place] at [facility]   PATIENT NAME: Madison Holt    MR#:  [id number]  DATE OF BIRTH:  09/22/1980  DATE OF ADMISSION:  06/06/2016   ADMITTING PHYSICIAN: Milagros Loll, MD  DATE OF DISCHARGE: 06/08/2016  3:00 PM  PRIMARY CARE PHYSICIAN: Hannah Beat, MD   ADMISSION DIAGNOSIS:  Shortness of breath [R06.02] Influenza A [J10.1] DISCHARGE DIAGNOSIS:  Active Problems:   Influenza A  SECONDARY DIAGNOSIS:   Past Medical History:  Diagnosis Date  . Pneumonia 2014   Hosp x 1 week  . Sarcoidosis (HCC)   . Shortness of breath dyspnea    HOSPITAL COURSE:  36 year old female with a history of sarcoid who presents with 3 days of nausea, vomiting and shortness of breath.  1. Sepsis due to Influenza a: Continue Tamiflu for total of 5 days. Sepsis present on admission and resolved on DC  2 nausea and vomiting due to influenza: resolved  3. History of sarcoid: Chest x-ray shows stable findings. outpt pulmo follow up  4. Leukopenia due to influenza A Improved. DISCHARGE CONDITIONS:  stable CONSULTS OBTAINED:  Treatment Team:  Mertie Moores, MD DRUG ALLERGIES:   Allergies  Allergen Reactions  . Codeine Other (See Comments)    Other reaction(s): Hallucination   DISCHARGE MEDICATIONS:   Allergies as of 06/08/2016      Reactions   Codeine Other (See Comments)   Other reaction(s): Hallucination      Medication List    TAKE these medications   acetaminophen 325 MG tablet Commonly known as:  TYLENOL Take 650 mg by mouth every 6 (six) hours as needed for mild pain or headache.   beclomethasone 40 MCG/ACT inhaler Commonly known as:  QVAR Inhale 2 puffs into the lungs 2 (two) times daily.   busPIRone 10 MG tablet Commonly known as:  BUSPAR Take 0.5 tablets (5 mg total) by mouth 2 (two) times daily. What changed:  how much to take   folic acid 1 MG tablet Commonly known as:  FOLVITE Take 1 tablet (1 mg total) by mouth daily.   LORazepam 0.5 MG tablet Commonly known as:  ATIVAN Take 1 tablet (0.5 mg total) by mouth every 8 (eight) hours as needed for anxiety.   methotrexate 2.5 MG tablet Commonly known as:  RHEUMATREX Take 3 tablets (7.5 mg total) by mouth once a week. Caution:Chemotherapy. Protect from light. What changed:  how much to take  additional instructions   oseltamivir 75 MG capsule Commonly known as:  TAMIFLU Take 1 capsule (75 mg total) by mouth 2 (two) times daily.   Vitamin D3 50000 units Caps Take 50,000 Units by mouth once a week.      DISCHARGE INSTRUCTIONS:   DIET:  Cardiac diet DISCHARGE CONDITION:  Good ACTIVITY:  Activity as tolerated OXYGEN:  Home Oxygen: No.  Oxygen Delivery: room air DISCHARGE LOCATION:  home   If you experience worsening of your admission symptoms, develop shortness of breath, life threatening emergency, suicidal or homicidal thoughts you must seek medical attention immediately by calling 911 or calling your MD immediately  if symptoms less severe.  You Must read complete instructions/literature along with all the possible adverse reactions/side effects for all the Medicines you take and that have been prescribed to you. Take any new Medicines after you have completely understood and accpet all the possible adverse reactions/side effects.   Please note  You were cared for by a hospitalist during your hospital stay. If you have any  questions about your discharge medications or the care you received while you were in the hospital after you are discharged, you can call the unit and asked to speak with the hospitalist on call if the hospitalist that took care of you is not available. Once you are discharged, your primary care physician will handle any further medical issues. Please note that NO REFILLS for any discharge medications will be authorized once you are discharged, as it is imperative that you return to your primary care physician (or establish a  relationship with a primary care physician if you do not have one) for your aftercare needs so that they can reassess your need for medications and monitor your lab values.    On the day of Discharge:  VITAL SIGNS:  Blood pressure 111/76, pulse 61, temperature 98 F (36.7 C), temperature source Oral, resp. rate 16, height  (1.6 m), weight 64.5 kg (142 lb 1.6 oz), last menstrual period 06/06/2016, SpO2 98 %. PHYSICAL EXAMINATION:  GENERAL:  36 y.o.-year-old patient lying in the bed with no acute distress.  EYES: Pupils equal, round, reactive to light and accommodation. No scleral icterus. Extraocular muscles intact.  HEENT: Head atraumatic, normocephalic. Oropharynx and nasopharynx clear.  NECK:  Supple, no jugular venous distention. No thyroid enlargement, no tenderness.  LUNGS: Normal breath sounds bilaterally, no wheezing, rales,rhonchi or crepitation. No use of accessory muscles of respiration.  CARDIOVASCULAR: S1, S2 normal. No murmurs, rubs, or gallops.  ABDOMEN: Soft, non-tender, non-distended. Bowel sounds present. No organomegaly or mass.  EXTREMITIES: No pedal edema, cyanosis, or clubbing.  NEUROLOGIC: Cranial nerves II through XII are intact. Muscle strength 5/5 in all extremities. Sensation intact. Gait not checked.  PSYCHIATRIC: The patient is alert and oriented x 3.  SKIN: No obvious rash, lesion, or ulcer.  DATA REVIEW:   CBC  Recent Labs Lab 06/08/16 0519  WBC 5.1  HGB 12.6  HCT 36.6  PLT 145*    Chemistries   Recent Labs Lab 06/06/16 0812 06/07/16 0451  NA 134* 140  K 3.5 4.6  CL 101 116*  CO2 21* 21*  GLUCOSE 99 151*  BUN 12 12  CREATININE 1.05* 0.73  CALCIUM 9.2 8.3*  AST 21  --   ALT 15  --   ALKPHOS 41  --   BILITOT 0.8  --     Follow-up Information    Hannah Beat, MD. Schedule an appointment as soon as possible for a visit in 1 week(s).   Specialty:  Family Medicine Why:  Follow up with Dr. Patsy Lager on Monday April 9th at 3:45pm.  jb Contact information: [address] [place] [address] [phone]        [facility], MD. Schedule an appointment as soon as possible for a visit in 2 week(s).   Specialty:  Pulmonary Disease Why:  Follow up with Dr. Marchelle Gearing on Friday April 20 at 3pm instead of 9:15. Your dr will be out of the office that morning. Office asked to switch the appointment to that afternoon. jb Contact information: [address] [place] [address] [phone]           Management plans discussed with the patient, family and they are in agreement.  CODE STATUS: Prior   TOTAL TIME TAKING CARE OF THIS PATIENT: 45 minutes.    [name] M.D on 06/09/2016 at 8:19 PM  Between 7am to 6pm - Pager - [phone]  After 6pm go to [email or web address] - password [email or web address]  [facility] [place] [facility]  Office  [phone]  CC: Primary care physician; Hannah Beat, MD   Note: This dictation was prepared with Dragon dictation along with smaller phrase technology. Any transcriptional errors that result from this process are unintentional.

## 2016-06-10 ENCOUNTER — Telehealth: Payer: Self-pay | Admitting: *Deleted

## 2016-06-10 NOTE — Telephone Encounter (Signed)
Attempted to contact pt  to complete TCM and confirm hosp f/u appt; unable to leave vm  

## 2016-06-11 LAB — CULTURE, BLOOD (ROUTINE X 2)
Culture: NO GROWTH
Culture: NO GROWTH
Special Requests: ADEQUATE
Special Requests: ADEQUATE

## 2016-06-13 ENCOUNTER — Ambulatory Visit: Payer: Managed Care, Other (non HMO) | Admitting: Family Medicine

## 2016-06-13 ENCOUNTER — Encounter: Payer: Self-pay | Admitting: Respiratory Therapy

## 2016-06-13 DIAGNOSIS — D869 Sarcoidosis, unspecified: Secondary | ICD-10-CM

## 2016-06-13 NOTE — Progress Notes (Signed)
Pulmonary Individual Treatment Plan  Patient Details  Name: Madison Holt MRN: [id number] Date of Birth: 1980/08/20 Referring Provider:     Pulmonary Rehab from 03/28/2016 in [facility] Cardiac and Pulmonary Rehab  Referring Provider  Brand Males MD      Initial Encounter Date:    Pulmonary Rehab from 03/28/2016 in [facility] Cardiac and Pulmonary Rehab  Date  03/28/16  Referring Provider  Brand Males MD      Visit Diagnosis: Sarcoidosis [facility])  Patient's Home Medications on Admission:  Current Outpatient Prescriptions:    acetaminophen (TYLENOL) 325 MG tablet, Take 650 mg by mouth every 6 (six) hours as needed for mild pain or headache. , Disp: , Rfl:    beclomethasone (QVAR) 40 MCG/ACT inhaler, Inhale 2 puffs into the lungs 2 (two) times daily., Disp: , Rfl:    busPIRone (BUSPAR) 10 MG tablet, Take 0.5 tablets (5 mg total) by mouth 2 (two) times daily., Disp: 60 tablet, Rfl: 0   Cholecalciferol (VITAMIN D3) 50000 units CAPS, Take 50,000 Units by mouth once a week., Disp: 4 capsule, Rfl: 2   folic acid (FOLVITE) 1 MG tablet, Take 1 tablet (1 mg total) by mouth daily., Disp: 30 tablet, Rfl: 5   LORazepam (ATIVAN) 0.5 MG tablet, Take 1 tablet (0.5 mg total) by mouth every 8 (eight) hours as needed for anxiety., Disp: 30 tablet, Rfl: 0   methotrexate (RHEUMATREX) 2.5 MG tablet, Take 3 tablets (7.5 mg total) by mouth once a week. Caution:Chemotherapy. Protect from light. (Patient taking differently: Take 5 mg by mouth once a week. Caution:Chemotherapy. Protect from light.), Disp: 12 tablet, Rfl: 5   oseltamivir (TAMIFLU) 75 MG capsule, Take 1 capsule (75 mg total) by mouth 2 (two) times daily., Disp: 6 capsule, Rfl: 0  Past Medical History: Past Medical History:  Diagnosis Date   Pneumonia 2014   Hosp x 1 week   Sarcoidosis ([place])    Shortness of breath dyspnea     Tobacco Use: History  Smoking Status   Former Smoker   Packs/day: 1.00   Years: 5.00   Types:  Cigarettes   Quit date: 03/07/1998  Smokeless Tobacco   Never Used    Labs: Recent Review [facility] for [facility] Cardiac and Pulmonary Rehab Latest Ref Rng & Units 04/10/2015   Cholestrol 0 - 200 mg/dL 132   LDLCALC 0 - 99 mg/dL 51   HDL >39.00 mg/dL 68.60   Trlycerides 0.0 - 149.0 mg/dL 63.0       ADL UCSD:     Pulmonary Assessment Scores    Row Name 03/28/16 1554         ADL UCSD   ADL Phase Entry     SOB Score total 52     Rest 2     Walk 3     Stairs 4     Bath 2     Dress 2     Shop 2        Pulmonary Function Assessment:     Pulmonary Function Assessment - 03/28/16 1552      Initial Spirometry Results   FVC% 103 %   FEV1% 87 %   FEV1/FVC Ratio 70   Comments Test date 05/03/16     Post Bronchodilator Spirometry Results   FVC% 104 %   FEV1% 95 %   FEV1/FVC Ratio 76     Breath   Bilateral Breath Sounds Clear   Shortness of Breath Yes;Limiting activity;Fear of Shortness  of Breath      Exercise Target Goals:    Exercise Program Goal: Individual exercise prescription set with THRR, safety & activity barriers. Participant demonstrates ability to understand and report RPE using BORG scale, to self-measure pulse accurately, and to acknowledge the importance of the exercise prescription.  Exercise Prescription Goal: Starting with aerobic activity 30 plus minutes a day, 3 days per week for initial exercise prescription. Provide home exercise prescription and guidelines that participant acknowledges understanding prior to discharge.  Activity Barriers & Risk Stratification:     Activity Barriers & Cardiac Risk Stratification - 03/28/16 1552      Activity Barriers & Cardiac Risk Stratification   Activity Barriers Shortness of Breath;Deconditioning   Cardiac Risk Stratification Moderate      6 Minute Walk:     6 Minute Walk    Row Name 03/28/16 1621         6 Minute Walk   Phase Initial     Distance 965 feet     Walk Time 4.31  minutes     # of Rest Breaks 1  1:41      MPH 2.54     METS 4.21     RPE 17     Perceived Dyspnea  7     VO2 Peak 14.75     Symptoms Yes (comment)     Comments dizzy, SOB, tingling in bilateral fingers, hands, and feet, legs wobbly, chest pressure "like someone in sitting on my chest"     Resting HR 66 bpm     Resting BP 106/66     Max Ex. HR 112 bpm     Max Ex. BP 126/66     2 Minute Post BP 126/64       Interval HR   Baseline HR 66     1 Minute HR 85     2 Minute HR 91     4 Minute HR 109     5 Minute HR 112     6 Minute HR 103     2 Minute Post HR 85     Interval Heart Rate? Yes       Interval Oxygen   Interval Oxygen? Yes     Baseline Oxygen Saturation % 96 %     Baseline Liters of Oxygen 0 L  Room Air     1 Minute Oxygen Saturation % 95 %     1 Minute Liters of Oxygen 0 L     2 Minute Oxygen Saturation % 88 %  At 2:54 86%     2 Minute Liters of Oxygen 0 L     3 Minute Oxygen Saturation % 92 %  Rest at 3:51 85% with quick recovery to 99%     3 Minute Liters of Oxygen 0 L     4 Minute Oxygen Saturation % 99 %     4 Minute Liters of Oxygen 0 L     5 Minute Oxygen Saturation % 100 %     5 Minute Liters of Oxygen 0 L     6 Minute Oxygen Saturation % 100 %     6 Minute Liters of Oxygen 0 L     2 Minute Post Oxygen Saturation % 99 %     2 Minute Post Liters of Oxygen 0 L       Oxygen Initial Assessment:   Oxygen Re-Evaluation:   Oxygen Discharge (Final Oxygen Re-Evaluation):   Initial Exercise Prescription:  Initial Exercise Prescription - 03/28/16 1600      Date of Initial Exercise RX and Referring Provider   Date 03/28/16   Referring Provider Brand Males MD     Treadmill   MPH 2.5   Grade 0.5   Minutes 15   METs 3.09     Recumbant Elliptical   Level 2   RPM 50   Minutes 15   METs 2     T5 Nustep   Level 3   SPM --  60-80 spm   Minutes 15   METs 3     Prescription Details   Frequency (times per week) 3   Duration Progress  to 45 minutes of aerobic exercise without signs/symptoms of physical distress     Intensity   THRR 40-80% of Max Heartrate 113-160   Ratings of Perceived Exertion 11-13   Perceived Dyspnea 0-4     Progression   Progression Continue to progress workloads to maintain intensity without signs/symptoms of physical distress.     Resistance Training   Training Prescription Yes   Weight 3 lbs   Reps 10-15      Perform Capillary Blood Glucose checks as needed.  Exercise Prescription Changes:     Exercise Prescription Changes    Row Name 04/04/16 1300 04/06/16 1200 04/20/16 1500 05/04/16 1500       Response to Exercise   Blood Pressure (Admit) 112/78 110/70 102/68 112/80    Blood Pressure (Exercise) 110/74 90/68 126/64 122/60    Blood Pressure (Exit) 118/60 100/60 124/62 114/70    Heart Rate (Admit) 82 bpm 88 bpm 102 bpm 91 bpm    Heart Rate (Exercise) 116 bpm 116 bpm 117 bpm 104 bpm    Heart Rate (Exit) 87 bpm 106 bpm 114 bpm 90 bpm    Oxygen Saturation (Admit) 96 % 99 % 98 % 100 %    Oxygen Saturation (Exercise) 97 % 97 % 96 % 99 %    Oxygen Saturation (Exit) 98 % 98 % 92 % 100 %    Rating of Perceived Exertion (Exercise) _0 Perceived Dyspnea (Exercise) _1 Symptoms  -- SOB on treadmill SOB on treadmill SOB on treadmill    Comments  -- Home Exercise Guidelines given 04/06/16 Home Exercise Guidelines given 04/06/16  --    Duration Progress to 45 minutes of aerobic exercise without signs/symptoms of physical distress Progress to 45 minutes of aerobic exercise without signs/symptoms of physical distress Progress to 45 minutes of aerobic exercise without signs/symptoms of physical distress Progress to 45 minutes of aerobic exercise without signs/symptoms of physical distress    Intensity THRR unchanged THRR unchanged THRR unchanged THRR unchanged      Progression   Progression Continue to progress workloads to maintain intensity without signs/symptoms of physical  distress. Continue to progress workloads to maintain intensity without signs/symptoms of physical distress. Continue to progress workloads to maintain intensity without signs/symptoms of physical distress. Continue to progress workloads to maintain intensity without signs/symptoms of physical distress.    Average METs  -- 2.02 2.16 2.39      Resistance Training   Training Prescription Yes Yes Yes Yes    Weight 3 green band 3 lbs 3 lbs    Reps 10-15 10-15 10-15 10-15      Interval Training   Interval Training No No No No      Treadmill   MPH 2._2 2  Grade 0.5 0.5 0.5 0.5    Minutes _0 METs 3.09 2.67 2.67 2.67      NuStep   Level  -- 4  --  --    Minutes  -- 15  --  --      Recumbant Elliptical   Level _1 RPM 50 50 50  --    Minutes _2 METs 1.4 1.6 1.9 2.2      T5 Nustep   Level _3 SPM  -- --  58 spm  -- 90    Minutes _4 METs  -- 1.8 1.9  70 spm 2.3      Home Exercise Plan   Plans to continue exercise at  -- Home  walking and using home gym Home  walking and using home gym Home (comment)  walking and using home gym    Frequency  -- Add 1 additional day to program exercise sessions. Add 1 additional day to program exercise sessions. Add 1 additional day to program exercise sessions.    Initial Home Exercises Provided  --  --  -- 04/06/16      Exercise Review   Progression  -- --  Second Day of Exercise Yes  --       Exercise Comments:     Exercise Comments    Row Name 04/04/16 1305 04/06/16 1245 04/06/16 1410 04/20/16 1542     Exercise Comments First full day of exercise!  Patient was oriented to gym and equipment including functions, settings, policies, and procedures.  Patient's individual exercise prescription and treatment plan were reviewed.  All starting workloads were established based on the results of the 6 minute walk test done at initial orientation visit.  The plan for exercise progression was  also introduced and progression will be customized based on patient's performance and goals. Reviewed home exercises with Delsa Sale today. She plans to walk at home for exercise in addition to using her home gym . Also discussed RPE, pulse, THR, restrictions, s/sx when to call MD, and weather considerations. She verbalized understanding.  Today Madison Holt had a pack of crackers prior to exercise and it was much easier for her to exercise today. Madison Holt is off to a good start with rehab.  She was able to get in over 20 min on the treadmill today.  We will continue to monitor her progression.  She is scheduled for a stress echo next Friday.       Exercise Goals and Review:   Exercise Goals Re-Evaluation :     Exercise Goals Re-Evaluation    Row Name 05/04/16 1557 05/17/16 1630           Exercise Goal Re-Evaluation   Exercise Goals Review Increase Physical Activity;Increase Strenth and Stamina Increase Physical Activity;Increase Strenth and Stamina      Comments Madison Holt to do well in rehab.  She is still getting short of breath on the treadmill, but she is now able to go for 10 min before having to take a rest break.   She is up to level 3 on the T5.  We will continue to monitor her progression. Out since last review.      Expected Outcomes Short: Madison Holt will work up to a full 15 min on the treadmill.  Long: Madison Holt will continue to work on her strength and  stamina by coming to class and walking at home. improved attendance         Discharge Exercise Prescription (Final Exercise Prescription Changes):     Exercise Prescription Changes - 05/04/16 1500      Response to Exercise   Blood Pressure (Admit) 112/80   Blood Pressure (Exercise) 122/60   Blood Pressure (Exit) 114/70   Heart Rate (Admit) 91 bpm   Heart Rate (Exercise) 104 bpm   Heart Rate (Exit) 90 bpm   Oxygen Saturation (Admit) 100 %   Oxygen Saturation (Exercise) 99 %   Oxygen Saturation (Exit) 100 %   Rating of Perceived Exertion  (Exercise) 12   Perceived Dyspnea (Exercise) 2   Symptoms SOB on treadmill   Duration Progress to 45 minutes of aerobic exercise without signs/symptoms of physical distress   Intensity THRR unchanged     Progression   Progression Continue to progress workloads to maintain intensity without signs/symptoms of physical distress.   Average METs 2.39     Resistance Training   Training Prescription Yes   Weight 3 lbs   Reps 10-15     Interval Training   Interval Training No     Treadmill   MPH 2   Grade 0.5   Minutes 15   METs 2.67     Recumbant Elliptical   Level 2   Minutes 10   METs 2.2     T5 Nustep   Level 3   SPM 90   Minutes 10   METs 2.3     Home Exercise Plan   Plans to continue exercise at Home (comment)  walking and using home gym   Frequency Add 1 additional day to program exercise sessions.   Initial Home Exercises Provided 04/06/16      Nutrition:  Target Goals: Understanding of nutrition guidelines, daily intake of sodium <1575m, cholesterol <2083m calories 30% from fat and 7% or less from saturated fats, daily to have 5 or more servings of fruits and vegetables.  Biometrics:     Pre Biometrics - 03/28/16 1629      Pre Biometrics   Height 5' 3.5" (1.613 m)   Weight 147 lb 11.2 oz (67 kg)   Waist Circumference 32 inches   Hip Circumference 41 inches   Waist to Hip Ratio 0.78 %   BMI (Calculated) 25.8       Nutrition Therapy Plan and Nutrition Goals:   Nutrition Discharge: Rate Your Plate Scores:   Nutrition Goals Re-Evaluation:   Nutrition Goals Discharge (Final Nutrition Goals Re-Evaluation):   Psychosocial: Target Goals: Acknowledge presence or absence of significant depression and/or stress, maximize coping skills, provide positive support system. Participant is able to verbalize types and ability to use techniques and skills needed for reducing stress and depression.   Initial Review & Psychosocial Screening:     Initial  Psych Review & Screening - 03/28/16 1601      Family Dynamics   Good Support System? Yes   Comments Madison Holt good support from her family and friends. She states she is not depressed, but is very frustrated with her shortness of breath and limited activity. Madison Holt looking forward to [facility] to help her achieve her goals of improved breathing and increased activity.     Screening Interventions   Interventions Encouraged to exercise;Program counselor consult      Quality of Life Scores:     Quality of Life - 03/28/16 1557      Quality of  Life Scores   Health/Function Pre 21 %   Socioeconomic Pre 20.88 %   Psych/Spiritual Pre 21 %   Family Pre 21 %   GLOBAL Pre 20.97 %      PHQ-9: Recent Review Flowsheet Data    Depression screen [facility] 2/9 03/28/2016   Decreased Interest 2   Down, Depressed, Hopeless 2   PHQ - 2 Score 4   Altered sleeping 3   Tired, decreased energy 3   Change in appetite 0   Feeling bad or failure about yourself  2   Trouble concentrating 0   Moving slowly or fidgety/restless 0   Suicidal thoughts 0   PHQ-9 Score 12   Difficult doing work/chores Somewhat difficult     Interpretation of Total Score  Total Score Depression Severity:  1-4 = Minimal depression, 5-9 = Mild depression, 10-14 = Moderate depression, 15-19 = Moderately severe depression, 20-27 = Severe depression   Psychosocial Evaluation and Intervention:     Psychosocial Evaluation - 04/04/16 1219      Psychosocial Evaluation & Interventions   Interventions Encouraged to exercise with the program and follow exercise prescription;Relaxation education;Stress management education   Comments Counselor met with Madison Holt today for initial psychosocial evaluation.  She is a 36 year old who was diagnosed with Sarcoidosis almost a year ago.  She has a strong support system with lots of family close by and a significant other in the home with her.  She reports sleeping better now that she  is off the steroids and her appetite has stabilized to where she can actually lose weight now.  She denies a history of depression or anxiety or current symptoms.  counselor brought to her attention that her PHQ-9 score of "12" indicates some possible depressive symptoms.  Discussed this with her stating her interrupted sleep and the stress of this diagnosis at such a young age have impacted her mood.  She is trying to determine her "new norm" and what to expect from this point on since she used to be so active in sports, etc.  Other stress in her life are parenting (21) children with her (3) boys and (2) steps there half time.  Her goals for this program are to learn how to "breathe again."  Counselor encouraged her to participate in the psychoeducational components of this program to help with stress and relaxation.  Staff will be following with Madison Holt throughout the course of this program.     Continue Psychosocial Services  Yes  Follow on mood and sleep once exercises a few weeks to see if symptoms improve.       Psychosocial Re-Evaluation:     Psychosocial Re-Evaluation    [place] Name 05/11/16 1609 05/20/16 1242           Psychosocial Re-Evaluation   Comments Madison Holt has attended [facility] on her lunch break from her job. She has increased her exercise goals and seems to have gained more confidence in exercising and has a better understanding of her shortness of breath. I hope she can continue [facility] with her job and she is commended for her  hard work in [employer].  I left a vm today on Madison Holt's cell Holt vm stating  she is probably busy at work so that is why hasn't attend Pulm Rehab lately. Hopefully she will return soon.          Psychosocial Discharge (Final Psychosocial Re-Evaluation):     Psychosocial Re-Evaluation - 05/20/16 1242  Psychosocial Re-Evaluation   Comments  I left a vm today on Madison Holt's cell Holt vm stating  she is probably busy at work so that is why  hasn't attend Pulm Rehab lately. Hopefully she will return soon.       Education: Education Goals: Education classes will be provided on a weekly basis, covering required topics. Participant will state understanding/return demonstration of topics presented.  Learning Barriers/Preferences:     Learning Barriers/Preferences - 03/28/16 1552      Learning Barriers/Preferences   Learning Barriers None   Learning Preferences None      Education Topics: Initial Evaluation Education: - Verbal, written and demonstration of respiratory meds, RPE/PD scales, oximetry and breathing techniques. Instruction on use of nebulizers and MDIs: cleaning and proper use, rinsing mouth with steroid doses and importance of monitoring MDI activations.   Pulmonary Rehab from 04/22/2016 in [facility] Cardiac and Pulmonary Rehab  Date  03/28/16  Educator  LB  Instruction Review Code  2- meets goals/outcomes      General Nutrition Guidelines/Fats and Fiber: -Group instruction provided by verbal, written material, models and posters to present the general guidelines for heart healthy nutrition. Gives an explanation and review of dietary fats and fiber.   Controlling Sodium/Reading Food Labels: -Group verbal and written material supporting the discussion of sodium use in heart healthy nutrition. Review and explanation with models, verbal and written materials for utilization of the food label.   Exercise Physiology & Risk Factors: - Group verbal and written instruction with models to review the exercise physiology of the cardiovascular system and associated critical values. Details cardiovascular disease risk factors and the goals associated with each risk factor.   Aerobic Exercise & Resistance Training: - Gives group verbal and written discussion on the health impact of inactivity. On the components of aerobic and resistive training programs and the benefits of this training and how to safely progress through  these programs.   Pulmonary Rehab from 04/22/2016 in [facility] Cardiac and Pulmonary Rehab  Date  04/06/16  Educator  [facility]  Instruction Review Code  2- meets goals/outcomes      Flexibility, Balance, General Exercise Guidelines: - Provides group verbal and written instruction on the benefits of flexibility and balance training programs. Provides general exercise guidelines with specific guidelines to those with heart or lung disease. Demonstration and skill practice provided.   Stress Management: - Provides group verbal and written instruction about the health risks of elevated stress, cause of high stress, and healthy ways to reduce stress.   Depression: - Provides group verbal and written instruction on the correlation between heart/lung disease and depressed mood, treatment options, and the stigmas associated with seeking treatment.   Exercise & Equipment Safety: - Individual verbal instruction and demonstration of equipment use and safety with use of the equipment.   Pulmonary Rehab from 04/22/2016 in [facility] Cardiac and Pulmonary Rehab  Date  04/04/16  Educator  [facility]  Instruction Review Code  2- meets goals/outcomes      Infection Prevention: - Provides verbal and written material to individual with discussion of infection control including proper hand washing and proper equipment cleaning during exercise session.   Pulmonary Rehab from 04/22/2016 in [facility] Cardiac and Pulmonary Rehab  Date  04/04/16  Educator  [facility]  Instruction Review Code  2- meets goals/outcomes      Falls Prevention: - Provides verbal and written material to individual with discussion of falls prevention and safety.   Diabetes: - Individual verbal and written instruction  to review signs/symptoms of diabetes, desired ranges of glucose level fasting, after meals and with exercise. Advice that pre and post exercise glucose checks will be done for 3 sessions at entry of program.   Chronic Lung Diseases: - Group  verbal and written instruction to review new updates, new respiratory medications, new advancements in procedures and treatments. Provide informative websites and "800" numbers of self-education.   Lung Procedures: - Group verbal and written instruction to describe testing methods done to diagnose lung disease. Review the outcome of test results. Describe the treatment choices: Pulmonary Function Tests, ABGs and oximetry.   Energy Conservation: - Provide group verbal and written instruction for methods to conserve energy, plan and organize activities. Instruct on pacing techniques, use of adaptive equipment and posture/positioning to relieve shortness of breath.   Triggers: - Group verbal and written instruction to review types of environmental controls: home humidity, furnaces, filters, dust mite/pet prevention, HEPA vacuums. To discuss weather changes, air quality and the benefits of nasal washing.   Exacerbations: - Group verbal and written instruction to provide: warning signs, infection symptoms, calling MD promptly, preventive modes, and value of vaccinations. Review: effective airway clearance, coughing and/or vibration techniques. Create an Sports administrator.   Pulmonary Rehab from 04/22/2016 in [facility] Cardiac and Pulmonary Rehab  Date  04/20/16  Educator  LB  Instruction Review Code  2- meets goals/outcomes      Oxygen: - Individual and group verbal and written instruction on oxygen therapy. Includes supplement oxygen, available portable oxygen systems, continuous and intermittent flow rates, oxygen safety, concentrators, and Medicare reimbursement for oxygen.   Respiratory Medications: - Group verbal and written instruction to review medications for lung disease. Drug class, frequency, complications, importance of spacers, rinsing mouth after steroid MDI's, and proper cleaning methods for nebulizers.   Pulmonary Rehab from 04/22/2016 in [facility] Cardiac and Pulmonary Rehab  Date  03/28/16   Educator  LB  Instruction Review Code  2- meets goals/outcomes      AED/CPR: - Group verbal and written instruction with the use of models to demonstrate the basic use of the AED with the basic ABC's of resuscitation.   Breathing Retraining: - Provides individuals verbal and written instruction on purpose, frequency, and proper technique of diaphragmatic breathing and pursed-lipped breathing. Applies individual practice skills.   Pulmonary Rehab from 04/22/2016 in [facility] Cardiac and Pulmonary Rehab  Date  03/28/16  Educator  LB  Instruction Review Code  2- meets goals/outcomes      Anatomy and Physiology of the Lungs: - Group verbal and written instruction with the use of models to provide basic lung anatomy and physiology related to function, structure and complications of lung disease.   Heart Failure: - Group verbal and written instruction on the basics of heart failure: signs/symptoms, treatments, explanation of ejection fraction, enlarged heart and cardiomyopathy.   Pulmonary Rehab from 04/22/2016 in [facility]  Date  04/22/16  Educator  CE  Instruction Review Code  2- meets goals/outcomes      Sleep Apnea: - Individual verbal and written instruction to review Obstructive Sleep Apnea. Review of risk factors, methods for diagnosing and types of masks and machines for OSA.   Anxiety: - Provides group, verbal and written instruction on the correlation between heart/lung disease and anxiety, treatment options, and management of anxiety.   Relaxation: - Provides group, verbal and written instruction about the benefits of relaxation for patients with heart/lung disease. Also provides patients with examples of relaxation techniques.  Knowledge Questionnaire Score:     Knowledge Questionnaire Score - 03/28/16 1552      Knowledge Questionnaire Score   Pre Score 5/10       Core Components/Risk Factors/Patient Goals at Admission:     Personal  Goals and Risk Factors at Admission - 03/28/16 1558      Core Components/Risk Factors/Patient Goals on Admission    Weight Management Yes;Weight Loss   Intervention Weight Management: Develop a combined nutrition and exercise program designed to reach desired caloric intake, while maintaining appropriate intake of nutrient and fiber, sodium and fats, and appropriate energy expenditure required for the weight goal.;Weight Management: Provide education and appropriate resources to help participant work on and attain dietary goals.   Admit Weight 147 lb 11.2 oz (67 kg)   Goal Weight: Short Term 142 lb (64.4 kg)   Goal Weight: Long Term 120 lb (54.4 kg)   Expected Outcomes Short Term: Continue to assess and modify interventions until short term weight is achieved;Long Term: Adherence to nutrition and physical activity/exercise program aimed toward attainment of established weight goal;Weight Maintenance: Understanding of the daily nutrition guidelines, which includes 25-35% calories from fat, 7% or less cal from saturated fats, less than 247m cholesterol, less than 1.5gm of sodium, & 5 or more servings of fruits and vegetables daily;Weight Loss: Understanding of general recommendations for a balanced deficit meal plan, which promotes 1-2 lb weight loss per week and includes a negative energy balance of [phone] kcal/d;Understanding recommendations for meals to include 15-35% energy as protein, 25-35% energy from fat, 35-60% energy from carbohydrates, less than 2047mof dietary cholesterol, 20-35 gm of total fiber daily;Understanding of distribution of calorie intake throughout the day with the consumption of 4-5 meals/snacks   Sedentary Yes  Goal: be able to run, play sports, be active with her children   Intervention Provide advice, education, support and counseling about physical activity/exercise needs.;Develop an individualized exercise prescription for aerobic and resistive training based on initial  evaluation findings, risk stratification, comorbidities and participant's personal goals.   Expected Outcomes Achievement of increased cardiorespiratory fitness and enhanced flexibility, muscular endurance and strength shown through measurements of functional capacity and personal statement of participant.   Increase Strength and Stamina Yes   Intervention Provide advice, education, support and counseling about physical activity/exercise needs.;Develop an individualized exercise prescription for aerobic and resistive training based on initial evaluation findings, risk stratification, comorbidities and participant's personal goals.   Expected Outcomes Achievement of increased cardiorespiratory fitness and enhanced flexibility, muscular endurance and strength shown through measurements of functional capacity and personal statement of participant.   Improve shortness of breath with ADL's Yes   Intervention Provide education, individualized exercise plan and daily activity instruction to help decrease symptoms of SOB with activities of daily living.   Expected Outcomes Short Term: Achieves a reduction of symptoms when performing activities of daily living.   Develop more efficient breathing techniques such as purse lipped breathing and diaphragmatic breathing; and practicing self-pacing with activity Yes   Intervention Provide education, demonstration and support about specific breathing techniuqes utilized for more efficient breathing. Include techniques such as pursed lipped breathing, diaphragmatic breathing and self-pacing activity.   Expected Outcomes Short Term: Participant will be able to demonstrate and use breathing techniques as needed throughout daily activities.   Increase knowledge of respiratory medications and ability to use respiratory devices properly  Yes  Qvar; spacer given with instructions.   Intervention Provide education and demonstration as needed of appropriate use of medications,  inhalers, and oxygen therapy.   Expected Outcomes Short Term: Achieves understanding of medications use. Understands that oxygen is a medication prescribed by physician. Demonstrates appropriate use of inhaler and oxygen therapy.      Core Components/Risk Factors/Patient Goals Review:      Goals and Risk Factor Review    Row Name 04/25/16 1257             Core Components/Risk Factors/Patient Goals Review   Personal Goals Review Weight Management/Obesity;Develop more efficient breathing techniques such as purse lipped breathing and diaphragmatic breathing and practicing self-pacing with activity.;Increase Strength and Stamina       Review Madison Holt has the info to schedule with the RD to help her reach weight loss goals.  She states she can see improvement in exercise and is managing her breathing better by slowing it down.  She feels ready to move up her levels on exercise machines next week.          Expected Outcomes Madison Holt will continue to manage her breathing and see continued progress towards exercise goals.          Core Components/Risk Factors/Patient Goals at Discharge (Final Review):      Goals and Risk Factor Review - 04/25/16 1257      Core Components/Risk Factors/Patient Goals Review   Personal Goals Review Weight Management/Obesity;Develop more efficient breathing techniques such as purse lipped breathing and diaphragmatic breathing and practicing self-pacing with activity.;Increase Strength and Stamina   Review Madison Holt has the info to schedule with the RD to help her reach weight loss goals.  She states she can see improvement in exercise and is managing her breathing better by slowing it down.  She feels ready to move up her levels on exercise machines next week.      Expected Outcomes Madison Holt will continue to manage her breathing and see continued progress towards exercise goals.      ITP Comments:     ITP Comments    Row Name 04/08/16 1130 04/15/16 1135 05/20/16 1242  05/31/16 1059     ITP Comments Attended [employer] Education Class See incomplete session note. Madison Holt and said she was sorry but could not attend Pulm Rehab on Monday or Wed since she was sick with an upper resp infection. Madison Holt reports that she had a negative flu test but that she is on antiibiotcs. I suggested that she not exercise so she can recover better.   I left a vm on Madison Holt vm stating  she is probably busy at work so that is why hasn't attend Pulm Rehab lately. Hopefully she will return soon.  Called Madison Holt - last attended 05/02/16. Called to check on Madison Holt and confirm she still plans to participate in [place]. - unable to leave a message.       Comments: 30 Day Note Review

## 2016-06-20 ENCOUNTER — Encounter: Payer: Self-pay | Admitting: Respiratory Therapy

## 2016-06-20 DIAGNOSIS — D869 Sarcoidosis, unspecified: Secondary | ICD-10-CM

## 2016-06-20 NOTE — Progress Notes (Signed)
Pulmonary Individual Treatment Plan  Patient Details  Name: Madison Holt MRN: [id number] Date of Birth: Jul 24, 1980 Referring Provider:     Pulmonary Rehab from 03/28/2016 in [facility] Cardiac and Pulmonary Rehab  Referring Provider  Brand Males MD      Initial Encounter Date:    Pulmonary Rehab from 03/28/2016 in [facility] Cardiac and Pulmonary Rehab  Date  03/28/16  Referring Provider  Brand Males MD      Visit Diagnosis: Sarcoidosis  Patient's Home Medications on Admission:  Current Outpatient Prescriptions:    acetaminophen (TYLENOL) 325 MG tablet, Take 650 mg by mouth every 6 (six) hours as needed for mild pain or headache. , Disp: , Rfl:    beclomethasone (QVAR) 40 MCG/ACT inhaler, Inhale 2 puffs into the lungs 2 (two) times daily., Disp: , Rfl:    busPIRone (BUSPAR) 10 MG tablet, Take 0.5 tablets (5 mg total) by mouth 2 (two) times daily., Disp: 60 tablet, Rfl: 0   Cholecalciferol (VITAMIN D3) 50000 units CAPS, Take 50,000 Units by mouth once a week., Disp: 4 capsule, Rfl: 2   folic acid (FOLVITE) 1 MG tablet, Take 1 tablet (1 mg total) by mouth daily., Disp: 30 tablet, Rfl: 5   LORazepam (ATIVAN) 0.5 MG tablet, Take 1 tablet (0.5 mg total) by mouth every 8 (eight) hours as needed for anxiety., Disp: 30 tablet, Rfl: 0   methotrexate (RHEUMATREX) 2.5 MG tablet, Take 3 tablets (7.5 mg total) by mouth once a week. Caution:Chemotherapy. Protect from light. (Patient taking differently: Take 5 mg by mouth once a week. Caution:Chemotherapy. Protect from light.), Disp: 12 tablet, Rfl: 5   oseltamivir (TAMIFLU) 75 MG capsule, Take 1 capsule (75 mg total) by mouth 2 (two) times daily., Disp: 6 capsule, Rfl: 0  Past Medical History: Past Medical History:  Diagnosis Date   Pneumonia 2014   Hosp x 1 week   Sarcoidosis    Shortness of breath dyspnea     Tobacco Use: History  Smoking Status   Former Smoker   Packs/day: 1.00   Years: 5.00   Types: Cigarettes    Quit date: 03/07/1998  Smokeless Tobacco   Never Used    Labs: Recent Review [facility] for [facility] Cardiac and Pulmonary Rehab Latest Ref Rng & Units 04/10/2015   Cholestrol 0 - 200 mg/dL 132   LDLCALC 0 - 99 mg/dL 51   HDL >39.00 mg/dL 68.60   Trlycerides 0.0 - 149.0 mg/dL 63.0       ADL UCSD:     Pulmonary Assessment Scores    Row Name 03/28/16 1554         ADL UCSD   ADL Phase Entry     SOB Score total 52     Rest 2     Walk 3     Stairs 4     Bath 2     Dress 2     Shop 2        Pulmonary Function Assessment:     Pulmonary Function Assessment - 03/28/16 1552      Initial Spirometry Results   FVC% 103 %   FEV1% 87 %   FEV1/FVC Ratio 70   Comments Test date 05/03/16     Post Bronchodilator Spirometry Results   FVC% 104 %   FEV1% 95 %   FEV1/FVC Ratio 76     Breath   Bilateral Breath Sounds Clear   Shortness of Breath Yes;Limiting activity;Fear of Shortness of Breath  Exercise Target Goals:    Exercise Program Goal: Individual exercise prescription set with THRR, safety & activity barriers. Participant demonstrates ability to understand and report RPE using BORG scale, to self-measure pulse accurately, and to acknowledge the importance of the exercise prescription.  Exercise Prescription Goal: Starting with aerobic activity 30 plus minutes a day, 3 days per week for initial exercise prescription. Provide home exercise prescription and guidelines that participant acknowledges understanding prior to discharge.  Activity Barriers & Risk Stratification:     Activity Barriers & Cardiac Risk Stratification - 03/28/16 1552      Activity Barriers & Cardiac Risk Stratification   Activity Barriers Shortness of Breath;Deconditioning   Cardiac Risk Stratification Moderate      6 Minute Walk:     6 Minute Walk    Row Name 03/28/16 1621         6 Minute Walk   Phase Initial     Distance 965 feet     Walk Time 4.31 minutes     #  of Rest Breaks 1  1:41      MPH 2.54     METS 4.21     RPE 17     Perceived Dyspnea  7     VO2 Peak 14.75     Symptoms Yes (comment)     Comments dizzy, SOB, tingling in bilateral fingers, hands, and feet, legs wobbly, chest pressure "like someone in sitting on my chest"     Resting HR 66 bpm     Resting BP 106/66     Max Ex. HR 112 bpm     Max Ex. BP 126/66     2 Minute Post BP 126/64       Interval HR   Baseline HR 66     1 Minute HR 85     2 Minute HR 91     4 Minute HR 109     5 Minute HR 112     6 Minute HR 103     2 Minute Post HR 85     Interval Heart Rate? Yes       Interval Oxygen   Interval Oxygen? Yes     Baseline Oxygen Saturation % 96 %     Baseline Liters of Oxygen 0 L  Room Air     1 Minute Oxygen Saturation % 95 %     1 Minute Liters of Oxygen 0 L     2 Minute Oxygen Saturation % 88 %  At 2:54 86%     2 Minute Liters of Oxygen 0 L     3 Minute Oxygen Saturation % 92 %  Rest at 3:51 85% with quick recovery to 99%     3 Minute Liters of Oxygen 0 L     4 Minute Oxygen Saturation % 99 %     4 Minute Liters of Oxygen 0 L     5 Minute Oxygen Saturation % 100 %     5 Minute Liters of Oxygen 0 L     6 Minute Oxygen Saturation % 100 %     6 Minute Liters of Oxygen 0 L     2 Minute Post Oxygen Saturation % 99 %     2 Minute Post Liters of Oxygen 0 L       Oxygen Initial Assessment:   Oxygen Re-Evaluation:   Oxygen Discharge (Final Oxygen Re-Evaluation):   Initial Exercise Prescription:     Initial Exercise Prescription -  03/28/16 1600      Date of Initial Exercise RX and Referring Provider   Date 03/28/16   Referring Provider Brand Males MD     Treadmill   MPH 2.5   Grade 0.5   Minutes 15   METs 3.09     Recumbant Elliptical   Level 2   RPM 50   Minutes 15   METs 2     T5 Nustep   Level 3   SPM --  60-80 spm   Minutes 15   METs 3     Prescription Details   Frequency (times per week) 3   Duration Progress to 45 minutes  of aerobic exercise without signs/symptoms of physical distress     Intensity   THRR 40-80% of Max Heartrate 113-160   Ratings of Perceived Exertion 11-13   Perceived Dyspnea 0-4     Progression   Progression Continue to progress workloads to maintain intensity without signs/symptoms of physical distress.     Resistance Training   Training Prescription Yes   Weight 3 lbs   Reps 10-15      Perform Capillary Blood Glucose checks as needed.  Exercise Prescription Changes:     Exercise Prescription Changes    Row Name 04/04/16 1300 04/06/16 1200 04/20/16 1500 05/04/16 1500       Response to Exercise   Blood Pressure (Admit) 112/78 110/70 102/68 112/80    Blood Pressure (Exercise) 110/74 90/68 126/64 122/60    Blood Pressure (Exit) 118/60 100/60 124/62 114/70    Heart Rate (Admit) 82 bpm 88 bpm 102 bpm 91 bpm    Heart Rate (Exercise) 116 bpm 116 bpm 117 bpm 104 bpm    Heart Rate (Exit) 87 bpm 106 bpm 114 bpm 90 bpm    Oxygen Saturation (Admit) 96 % 99 % 98 % 100 %    Oxygen Saturation (Exercise) 97 % 97 % 96 % 99 %    Oxygen Saturation (Exit) 98 % 98 % 92 % 100 %    Rating of Perceived Exertion (Exercise) _0 Perceived Dyspnea (Exercise) _1 Symptoms  -- SOB on treadmill SOB on treadmill SOB on treadmill    Comments  -- Home Exercise Guidelines given 04/06/16 Home Exercise Guidelines given 04/06/16  --    Duration Progress to 45 minutes of aerobic exercise without signs/symptoms of physical distress Progress to 45 minutes of aerobic exercise without signs/symptoms of physical distress Progress to 45 minutes of aerobic exercise without signs/symptoms of physical distress Progress to 45 minutes of aerobic exercise without signs/symptoms of physical distress    Intensity THRR unchanged THRR unchanged THRR unchanged THRR unchanged      Progression   Progression Continue to progress workloads to maintain intensity without signs/symptoms of physical distress. Continue  to progress workloads to maintain intensity without signs/symptoms of physical distress. Continue to progress workloads to maintain intensity without signs/symptoms of physical distress. Continue to progress workloads to maintain intensity without signs/symptoms of physical distress.    Average METs  -- 2.02 2.16 2.39      Resistance Training   Training Prescription Yes Yes Yes Yes    Weight 3 green band 3 lbs 3 lbs    Reps 10-15 10-15 10-15 10-15      Interval Training   Interval Training No No No No      Treadmill   MPH 2._2 Grade 0.5  0.5 0.5 0.5    Minutes _0 METs 3.09 2.67 2.67 2.67      NuStep   Level  -- 4  --  --    Minutes  -- 15  --  --      Recumbant Elliptical   Level _1 RPM 50 50 50  --    Minutes _2 METs 1.4 1.6 1.9 2.2      T5 Nustep   Level _3 SPM  -- --  58 spm  -- 90    Minutes _4 METs  -- 1.8 1.9  70 spm 2.3      Home Exercise Plan   Plans to continue exercise at  -- Home  walking and using home gym Home  walking and using home gym Home (comment)  walking and using home gym    Frequency  -- Add 1 additional day to program exercise sessions. Add 1 additional day to program exercise sessions. Add 1 additional day to program exercise sessions.    Initial Home Exercises Provided  --  --  -- 04/06/16      Exercise Review   Progression  -- --  Second Day of Exercise Yes  --       Exercise Comments:     Exercise Comments    Row Name 04/04/16 1305 04/06/16 1245 04/06/16 1410 04/20/16 1542     Exercise Comments First full day of exercise!  Patient was oriented to gym and equipment including functions, settings, policies, and procedures.  Patient's individual exercise prescription and treatment plan were reviewed.  All starting workloads were established based on the results of the 6 minute walk test done at initial orientation visit.  The plan for exercise progression was also introduced and  progression will be customized based on patient's performance and goals. Reviewed home exercises with Delsa Sale today. She plans to walk at home for exercise in addition to using her home gym . Also discussed RPE, pulse, THR, restrictions, s/sx when to call MD, and weather considerations. She verbalized understanding.  Today Madison Holt had a pack of crackers prior to exercise and it was much easier for her to exercise today. Madison Holt is off to a good start with rehab.  She was able to get in over 20 min on the treadmill today.  We will continue to monitor her progression.  She is scheduled for a stress echo next Friday.       Exercise Goals and Review:   Exercise Goals Re-Evaluation :     Exercise Goals Re-Evaluation    Row Name 05/04/16 1557 05/17/16 1630           Exercise Goal Re-Evaluation   Exercise Goals Review Increase Physical Activity;Increase Strenth and Stamina Increase Physical Activity;Increase Strenth and Stamina      Comments Madison Holt continues to do well in rehab.  She is still getting short of breath on the treadmill, but she is now able to go for 10 min before having to take a rest break.   She is up to level 3 on the T5.  We will continue to monitor her progression. Out since last review.      Expected Outcomes Short: Madison Holt will work up to a full 15 min on the treadmill.  Long: Madison Holt will continue to work on her strength and stamina by  coming to class and walking at home. improved attendance         Discharge Exercise Prescription (Final Exercise Prescription Changes):     Exercise Prescription Changes - 05/04/16 1500      Response to Exercise   Blood Pressure (Admit) 112/80   Blood Pressure (Exercise) 122/60   Blood Pressure (Exit) 114/70   Heart Rate (Admit) 91 bpm   Heart Rate (Exercise) 104 bpm   Heart Rate (Exit) 90 bpm   Oxygen Saturation (Admit) 100 %   Oxygen Saturation (Exercise) 99 %   Oxygen Saturation (Exit) 100 %   Rating of Perceived Exertion (Exercise) 12    Perceived Dyspnea (Exercise) 2   Symptoms SOB on treadmill   Duration Progress to 45 minutes of aerobic exercise without signs/symptoms of physical distress   Intensity THRR unchanged     Progression   Progression Continue to progress workloads to maintain intensity without signs/symptoms of physical distress.   Average METs 2.39     Resistance Training   Training Prescription Yes   Weight 3 lbs   Reps 10-15     Interval Training   Interval Training No     Treadmill   MPH 2   Grade 0.5   Minutes 15   METs 2.67     Recumbant Elliptical   Level 2   Minutes 10   METs 2.2     T5 Nustep   Level 3   SPM 90   Minutes 10   METs 2.3     Home Exercise Plan   Plans to continue exercise at Home (comment)  walking and using home gym   Frequency Add 1 additional day to program exercise sessions.   Initial Home Exercises Provided 04/06/16      Nutrition:  Target Goals: Understanding of nutrition guidelines, daily intake of sodium '1500mg'$ , cholesterol '200mg'$ , calories 30% from fat and 7% or less from saturated fats, daily to have 5 or more servings of fruits and vegetables.  Biometrics:     Pre Biometrics - 03/28/16 1629      Pre Biometrics   Height 5' 3.5" (1.613 m)   Weight 147 lb 11.2 oz (67 kg)   Waist Circumference 32 inches   Hip Circumference 41 inches   Waist to Hip Ratio 0.78 %   BMI (Calculated) 25.8       Nutrition Therapy Plan and Nutrition Goals:   Nutrition Discharge: Rate Your Plate Scores:   Nutrition Goals Re-Evaluation:   Nutrition Goals Discharge (Final Nutrition Goals Re-Evaluation):   Psychosocial: Target Goals: Acknowledge presence or absence of significant depression and/or stress, maximize coping skills, provide positive support system. Participant is able to verbalize types and ability to use techniques and skills needed for reducing stress and depression.   Initial Review & Psychosocial Screening:     Initial Psych Review &  Screening - 03/28/16 1601      Family Dynamics   Good Support System? Yes   Comments Madison Holt has good support from her family and friends. She states she is not depressed, but is very frustrated with her shortness of breath and limited activity. Madison Holt is looking forward to [facility] to help her achieve her goals of improved breathing and increased activity.     Screening Interventions   Interventions Encouraged to exercise;Program counselor consult      Quality of Life Scores:     Quality of Life - 03/28/16 1557      Quality of Life Scores  Health/Function Pre 21 %   Socioeconomic Pre 20.88 %   Psych/Spiritual Pre 21 %   Family Pre 21 %   GLOBAL Pre 20.97 %      PHQ-9: Recent Review Flowsheet Data    Depression screen [facility] 2/9 03/28/2016   Decreased Interest 2   Down, Depressed, Hopeless 2   PHQ - 2 Score 4   Altered sleeping 3   Tired, decreased energy 3   Change in appetite 0   Feeling bad or failure about yourself  2   Trouble concentrating 0   Moving slowly or fidgety/restless 0   Suicidal thoughts 0   PHQ-9 Score 12   Difficult doing work/chores Somewhat difficult     Interpretation of Total Score  Total Score Depression Severity:  1-4 = Minimal depression, 5-9 = Mild depression, 10-14 = Moderate depression, 15-19 = Moderately severe depression, 20-27 = Severe depression   Psychosocial Evaluation and Intervention:     Psychosocial Evaluation - 04/04/16 1219      Psychosocial Evaluation & Interventions   Interventions Encouraged to exercise with the program and follow exercise prescription;Relaxation education;Stress management education   Comments Counselor met with Madison. Holt today for initial psychosocial evaluation.  She is a 36 year old who was diagnosed with Sarcoidosis almost a year ago.  She has a strong support system with lots of family close by and a significant other in the home with her.  She reports sleeping better now that she is off the  steroids and her appetite has stabilized to where she can actually lose weight now.  She denies a history of depression or anxiety or current symptoms.  counselor brought to her attention that her PHQ-9 score of "12" indicates some possible depressive symptoms.  Discussed this with her stating her interrupted sleep and the stress of this diagnosis at such a young age have impacted her mood.  She is trying to determine her "new norm" and what to expect from this point on since she used to be so active in sports, etc.  Other stress in her life are parenting (24) children with her (3) boys and (2) steps there half time.  Her goals for this program are to learn how to "breathe again."  Counselor encouraged her to participate in the psychoeducational components of this program to help with stress and relaxation.  Staff will be following with Madison. Holt throughout the course of this program.     Continue Psychosocial Services  Yes  Follow on mood and sleep once exercises a few weeks to see if symptoms improve.       Psychosocial Re-Evaluation:     Psychosocial Re-Evaluation    [place] Name 05/11/16 1609 05/20/16 1242           Psychosocial Re-Evaluation   Comments Madison Holt has attended [facility] on her lunch break from her job. She has increased her exercise goals and seems to have gained more confidence in exercising and has a better understanding of her shortness of breath. I hope she can continue [facility] with her job and she is commended for her  hard work in [employer].  I left a vm today on Madison Holt's cell phone vm stating  she is probably busy at work so that is why hasn't attend Pulm Rehab lately. Hopefully she will return soon.          Psychosocial Discharge (Final Psychosocial Re-Evaluation):     Psychosocial Re-Evaluation - 05/20/16 1242      Psychosocial  Re-Evaluation   Comments  I left a vm today on Madison Holt's cell phone vm stating  she is probably busy at work so that is why hasn't  attend Pulm Rehab lately. Hopefully she will return soon.       Education: Education Goals: Education classes will be provided on a weekly basis, covering required topics. Participant will state understanding/return demonstration of topics presented.  Learning Barriers/Preferences:     Learning Barriers/Preferences - 03/28/16 1552      Learning Barriers/Preferences   Learning Barriers None   Learning Preferences None      Education Topics: Initial Evaluation Education: - Verbal, written and demonstration of respiratory meds, RPE/PD scales, oximetry and breathing techniques. Instruction on use of nebulizers and MDIs: cleaning and proper use, rinsing mouth with steroid doses and importance of monitoring MDI activations.   Pulmonary Rehab from 04/22/2016 in [facility] Cardiac and Pulmonary Rehab  Date  03/28/16  Educator  [facility]  Instruction Review Code  2- meets goals/outcomes      General Nutrition Guidelines/Fats and Fiber: -Group instruction provided by verbal, written material, models and posters to present the general guidelines for heart healthy nutrition. Gives an explanation and review of dietary fats and fiber.   Controlling Sodium/Reading Food Labels: -Group verbal and written material supporting the discussion of sodium use in heart healthy nutrition. Review and explanation with models, verbal and written materials for utilization of the food label.   Exercise Physiology & Risk Factors: - Group verbal and written instruction with models to review the exercise physiology of the cardiovascular system and associated critical values. Details cardiovascular disease risk factors and the goals associated with each risk factor.   Aerobic Exercise & Resistance Training: - Gives group verbal and written discussion on the health impact of inactivity. On the components of aerobic and resistive training programs and the benefits of this training and how to safely progress through these  programs.   Pulmonary Rehab from 04/22/2016 in [facility] Cardiac and Pulmonary Rehab  Date  04/06/16  Educator  [facility]  Instruction Review Code  2- meets goals/outcomes      Flexibility, Balance, General Exercise Guidelines: - Provides group verbal and written instruction on the benefits of flexibility and balance training programs. Provides general exercise guidelines with specific guidelines to those with heart or lung disease. Demonstration and skill practice provided.   Stress Management: - Provides group verbal and written instruction about the health risks of elevated stress, cause of high stress, and healthy ways to reduce stress.   Depression: - Provides group verbal and written instruction on the correlation between heart/lung disease and depressed mood, treatment options, and the stigmas associated with seeking treatment.   Exercise & Equipment Safety: - Individual verbal instruction and demonstration of equipment use and safety with use of the equipment.   Pulmonary Rehab from 04/22/2016 in [facility] Cardiac and Pulmonary Rehab  Date  04/04/16  Educator  [facility]  Instruction Review Code  2- meets goals/outcomes      Infection Prevention: - Provides verbal and written material to individual with discussion of infection control including proper hand washing and proper equipment cleaning during exercise session.   Pulmonary Rehab from 04/22/2016 in [facility] Cardiac and Pulmonary Rehab  Date  04/04/16  Educator  [facility]  Instruction Review Code  2- meets goals/outcomes      Falls Prevention: - Provides verbal and written material to individual with discussion of falls prevention and safety.   Diabetes: - Individual verbal and written instruction to  review signs/symptoms of diabetes, desired ranges of glucose level fasting, after meals and with exercise. Advice that pre and post exercise glucose checks will be done for 3 sessions at entry of program.   Chronic Lung Diseases: - Group verbal and  written instruction to review new updates, new respiratory medications, new advancements in procedures and treatments. Provide informative websites and "800" numbers of self-education.   Lung Procedures: - Group verbal and written instruction to describe testing methods done to diagnose lung disease. Review the outcome of test results. Describe the treatment choices: Pulmonary Function Tests, ABGs and oximetry.   Energy Conservation: - Provide group verbal and written instruction for methods to conserve energy, plan and organize activities. Instruct on pacing techniques, use of adaptive equipment and posture/positioning to relieve shortness of breath.   Triggers: - Group verbal and written instruction to review types of environmental controls: home humidity, furnaces, filters, dust mite/pet prevention, HEPA vacuums. To discuss weather changes, air quality and the benefits of nasal washing.   Exacerbations: - Group verbal and written instruction to provide: warning signs, infection symptoms, calling MD promptly, preventive modes, and value of vaccinations. Review: effective airway clearance, coughing and/or vibration techniques. Create an Sports administrator.   Pulmonary Rehab from 04/22/2016 in [facility] Cardiac and Pulmonary Rehab  Date  04/20/16  Educator  LB  Instruction Review Code  2- meets goals/outcomes      Oxygen: - Individual and group verbal and written instruction on oxygen therapy. Includes supplement oxygen, available portable oxygen systems, continuous and intermittent flow rates, oxygen safety, concentrators, and Medicare reimbursement for oxygen.   Respiratory Medications: - Group verbal and written instruction to review medications for lung disease. Drug class, frequency, complications, importance of spacers, rinsing mouth after steroid MDI's, and proper cleaning methods for nebulizers.   Pulmonary Rehab from 04/22/2016 in [facility] Cardiac and Pulmonary Rehab  Date  03/28/16  Educator   LB  Instruction Review Code  2- meets goals/outcomes      AED/CPR: - Group verbal and written instruction with the use of models to demonstrate the basic use of the AED with the basic ABC's of resuscitation.   Breathing Retraining: - Provides individuals verbal and written instruction on purpose, frequency, and proper technique of diaphragmatic breathing and pursed-lipped breathing. Applies individual practice skills.   Pulmonary Rehab from 04/22/2016 in [facility] Cardiac and Pulmonary Rehab  Date  03/28/16  Educator  LB  Instruction Review Code  2- meets goals/outcomes      Anatomy and Physiology of the Lungs: - Group verbal and written instruction with the use of models to provide basic lung anatomy and physiology related to function, structure and complications of lung disease.   Heart Failure: - Group verbal and written instruction on the basics of heart failure: signs/symptoms, treatments, explanation of ejection fraction, enlarged heart and cardiomyopathy.   Pulmonary Rehab from 04/22/2016 in [facility] Pul[facility] Re[facility]  Date  04/22/16  Educator  CE  Instruction Review Code  2- meets goals/outcomes      Sleep Apnea: - Individual verbal and written instruction to review Obstructive Sleep Apnea. Review of risk factors, methods for diagnosing and types of masks and machines for OSA.   Anxiety: - Provides group, verbal and written instruction on the correlation between heart/lung disease and anxiety, treatment options, and management of anxiety.   Relaxation: - Provides group, verbal and written instruction about the benefits of relaxation for patients with heart/lung disease. Also provides patients with examples of relaxation techniques.  Knowledge Questionnaire Score:     Knowledge Questionnaire Score - 03/28/16 1552      Knowledge Questionnaire Score   Pre Score 5/10       Core Components/Risk Factors/Patient Goals at Admission:     Personal Goals and Risk  Factors at Admission - 03/28/16 1558      Core Components/Risk Factors/Patient Goals on Admission    Weight Management Yes;Weight Loss   Intervention Weight Management: Develop a combined nutrition and exercise program designed to reach desired caloric intake, while maintaining appropriate intake of nutrient and fiber, sodium and fats, and appropriate energy expenditure required for the weight goal.;Weight Management: Provide education and appropriate resources to help participant work on and attain dietary goals.   Admit Weight 147 lb 11.2 oz (67 kg)   Goal Weight: Short Term 142 lb (64.4 kg)   Goal Weight: Long Term 120 lb (54.4 kg)   Expected Outcomes Short Term: Continue to assess and modify interventions until short term weight is achieved;Long Term: Adherence to nutrition and physical activity/exercise program aimed toward attainment of established weight goal;Weight Maintenance: Understanding of the daily nutrition guidelines, which includes 25-35% calories from fat, 7% or less cal from saturated fats, less than 211m cholesterol, less than 1.5gm of sodium, & 5 or more servings of fruits and vegetables daily;Weight Loss: Understanding of general recommendations for a balanced deficit meal plan, which promotes 1-2 lb weight loss per week and includes a negative energy balance of [phone] kcal/d;Understanding recommendations for meals to include 15-35% energy as protein, 25-35% energy from fat, 35-60% energy from carbohydrates, less than 2039mof dietary cholesterol, 20-35 gm of total fiber daily;Understanding of distribution of calorie intake throughout the day with the consumption of 4-5 meals/snacks   Sedentary Yes  Goal: be able to run, play sports, be active with her children   Intervention Provide advice, education, support and counseling about physical activity/exercise needs.;Develop an individualized exercise prescription for aerobic and resistive training based on initial evaluation  findings, risk stratification, comorbidities and participant's personal goals.   Expected Outcomes Achievement of increased cardiorespiratory fitness and enhanced flexibility, muscular endurance and strength shown through measurements of functional capacity and personal statement of participant.   Increase Strength and Stamina Yes   Intervention Provide advice, education, support and counseling about physical activity/exercise needs.;Develop an individualized exercise prescription for aerobic and resistive training based on initial evaluation findings, risk stratification, comorbidities and participant's personal goals.   Expected Outcomes Achievement of increased cardiorespiratory fitness and enhanced flexibility, muscular endurance and strength shown through measurements of functional capacity and personal statement of participant.   Improve shortness of breath with ADL's Yes   Intervention Provide education, individualized exercise plan and daily activity instruction to help decrease symptoms of SOB with activities of daily living.   Expected Outcomes Short Term: Achieves a reduction of symptoms when performing activities of daily living.   Develop more efficient breathing techniques such as purse lipped breathing and diaphragmatic breathing; and practicing self-pacing with activity Yes   Intervention Provide education, demonstration and support about specific breathing techniuqes utilized for more efficient breathing. Include techniques such as pursed lipped breathing, diaphragmatic breathing and self-pacing activity.   Expected Outcomes Short Term: Participant will be able to demonstrate and use breathing techniques as needed throughout daily activities.   Increase knowledge of respiratory medications and ability to use respiratory devices properly  Yes  Qvar; spacer given with instructions.   Intervention Provide education and demonstration as needed of appropriate use of medications,  inhalers,  and oxygen therapy.   Expected Outcomes Short Term: Achieves understanding of medications use. Understands that oxygen is a medication prescribed by physician. Demonstrates appropriate use of inhaler and oxygen therapy.      Core Components/Risk Factors/Patient Goals Review:      Goals and Risk Factor Review    Row Name 04/25/16 1257             Core Components/Risk Factors/Patient Goals Review   Personal Goals Review Weight Management/Obesity;Develop more efficient breathing techniques such as purse lipped breathing and diaphragmatic breathing and practicing self-pacing with activity.;Increase Strength and Stamina       Review Madison Holt has the info to schedule with the RD to help her reach weight loss goals.  She states she can see improvement in exercise and is managing her breathing better by slowing it down.  She feels ready to move up her levels on exercise machines next week.          Expected Outcomes Madison Holt will continue to manage her breathing and see continued progress towards exercise goals.          Core Components/Risk Factors/Patient Goals at Discharge (Final Review):      Goals and Risk Factor Review - 04/25/16 1257      Core Components/Risk Factors/Patient Goals Review   Personal Goals Review Weight Management/Obesity;Develop more efficient breathing techniques such as purse lipped breathing and diaphragmatic breathing and practicing self-pacing with activity.;Increase Strength and Stamina   Review Madison Holt has the info to schedule with the RD to help her reach weight loss goals.  She states she can see improvement in exercise and is managing her breathing better by slowing it down.  She feels ready to move up her levels on exercise machines next week.      Expected Outcomes Madison Holt will continue to manage her breathing and see continued progress towards exercise goals.      ITP Comments:     ITP Comments    Row Name 04/08/16 1130 04/15/16 1135 05/20/16 1242 05/31/16 1059  06/20/16 0904   ITP Comments Attended [employer] Education Class See incomplete session note. Madison Holt arrived and said she was sorry but could not attend Pulm Rehab on Monday or Wed since she was sick with an upper resp infection. Madison Holt reports that she had a negative flu test but that she is on antiibiotcs. I suggested that she not exercise so she can recover better.   I left a vm on Madison Holt's cell phone vm stating  she is probably busy at work so that is why hasn't attend Pulm Rehab lately. Hopefully she will return soon.  Called Madison Holt - last attended 05/02/16. Called to check on Madison Holt and confirm she still plans to participate in [place]. - unable to leave a message. Since Madison Holt last attended on 05/02/16 and has no contacted me about continuing in [place]. I am discharging her from the program.      Comments: Since Madison Holt last attended on 05/02/16 and has no contacted me about continuing in [place]. I am discharging her from the program.

## 2016-06-20 NOTE — Progress Notes (Signed)
Discharge Summary  Patient Details  Name: Madison Holt MRN: [id number] Date of Birth: December 10, 1980 Referring Provider:     Pulmonary Rehab from 03/28/2016 in [facility]  Referring Provider  Madison Shan MD       Number of Visits: 9 Reason for Discharge:  Early Exit:  Since Madison Holt last attended on 05/02/16 and has no contacted me about continuing in [name] from the program  Smoking History:  History  Smoking Status   Former Smoker   Packs/day: 1.00   Years: 5.00   Types: Cigarettes   Quit date: 03/07/1998  Smokeless Tobacco   Never Used    Diagnosis:  Sarcoidosis  ADL UCSD:     Pulmonary Assessment Scores    Row Name 03/28/16 1554         ADL UCSD   ADL Phase Entry     SOB Score total 52     Rest 2     Walk 3     Stairs 4     Bath 2     Dress 2     Shop 2        Initial Exercise Prescription:     Initial Exercise Prescription - 03/28/16 1600      Date of Initial Exercise RX and Referring Provider   Date 03/28/16   Referring Provider Madison Shan MD     Treadmill   MPH 2.5   Grade 0.5   Minutes 15   METs 3.09     Recumbant Elliptical   Level 2   RPM 50   Minutes 15   METs 2     T5 Nustep   Level 3   SPM --  60-80 spm   Minutes 15   METs 3     Prescription Details   Frequency (times per week) 3   Duration Progress to 45 minutes of aerobic exercise without signs/symptoms of physical distress     Intensity   THRR 40-80% of Max Heartrate 113-160   Ratings of Perceived Exertion 11-13   Perceived Dyspnea 0-4     Progression   Progression Continue to progress workloads to maintain intensity without signs/symptoms of physical distress.     Resistance Training   Training Prescription Yes   Weight 3 lbs   Reps 10-15      Discharge Exercise Prescription (Final Exercise Prescription Changes):     Exercise Prescription Changes - 05/04/16 1500      Response to Exercise    Blood Pressure (Admit) 112/80   Blood Pressure (Exercise) 122/60   Blood Pressure (Exit) 114/70   Heart Rate (Admit) 91 bpm   Heart Rate (Exercise) 104 bpm   Heart Rate (Exit) 90 bpm   Oxygen Saturation (Admit) 100 %   Oxygen Saturation (Exercise) 99 %   Oxygen Saturation (Exit) 100 %   Rating of Perceived Exertion (Exercise) 12   Perceived Dyspnea (Exercise) 2   Symptoms SOB on treadmill   Duration Progress to 45 minutes of aerobic exercise without signs/symptoms of physical distress   Intensity THRR unchanged     Progression   Progression Continue to progress workloads to maintain intensity without signs/symptoms of physical distress.   Average METs 2.39     Resistance Training   Training Prescription Yes   Weight 3 lbs   Reps 10-15     Interval Training   Interval Training No     Treadmill   MPH 2  Grade 0.5   Minutes 15   METs 2.67     Recumbant Elliptical   Level 2   Minutes 10   METs 2.2     T5 Nustep   Level 3   SPM 90   Minutes 10   METs 2.3     Home Exercise Plan   Plans to continue exercise at Home (comment)  walking and using home gym   Frequency Add 1 additional day to program exercise sessions.   Initial Home Exercises Provided 04/06/16      Functional Capacity:     6 Minute Walk    Row Name 03/28/16 1621         6 Minute Walk   Phase Initial     Distance 965 feet     Walk Time 4.31 minutes     # of Rest Breaks 1  1:41      MPH 2.54     METS 4.21     RPE 17     Perceived Dyspnea  7     VO2 Peak 14.75     Symptoms Yes (comment)     Comments dizzy, SOB, tingling in bilateral fingers, hands, and feet, legs wobbly, chest pressure "like someone in sitting on my chest"     Resting HR 66 bpm     Resting BP 106/66     Max Ex. HR 112 bpm     Max Ex. BP 126/66     2 Minute Post BP 126/64       Interval HR   Baseline HR 66     1 Minute HR 85     2 Minute HR 91     4 Minute HR 109     5 Minute HR 112     6 Minute HR 103     2  Minute Post HR 85     Interval Heart Rate? Yes       Interval Oxygen   Interval Oxygen? Yes     Baseline Oxygen Saturation % 96 %     Baseline Liters of Oxygen 0 L  Room Air     1 Minute Oxygen Saturation % 95 %     1 Minute Liters of Oxygen 0 L     2 Minute Oxygen Saturation % 88 %  At 2:54 86%     2 Minute Liters of Oxygen 0 L     3 Minute Oxygen Saturation % 92 %  Rest at 3:51 85% with quick recovery to 99%     3 Minute Liters of Oxygen 0 L     4 Minute Oxygen Saturation % 99 %     4 Minute Liters of Oxygen 0 L     5 Minute Oxygen Saturation % 100 %     5 Minute Liters of Oxygen 0 L     6 Minute Oxygen Saturation % 100 %     6 Minute Liters of Oxygen 0 L     2 Minute Post Oxygen Saturation % 99 %     2 Minute Post Liters of Oxygen 0 L        Psychological, QOL, Others - Outcomes: PHQ 2/9: Depression screen PHQ 2/9 03/28/2016  Decreased Interest 2  Down, Depressed, Hopeless 2  PHQ - 2 Score 4  Altered sleeping 3  Tired, decreased energy 3  Change in appetite 0  Feeling bad or failure about yourself  2  Trouble concentrating 0  Moving slowly  or fidgety/restless 0  Suicidal thoughts 0  PHQ-9 Score 12  Difficult doing work/chores Somewhat difficult    Quality of Life:     Quality of Life - 03/28/16 1557      Quality of Life Scores   Health/Function Pre 21 %   Socioeconomic Pre 20.88 %   Psych/Spiritual Pre 21 %   Family Pre 21 %   GLOBAL Pre 20.97 %      Personal Goals: Goals established at orientation with interventions provided to work toward goal.     Personal Goals and Risk Factors at Admission - 03/28/16 1558      Core Components/Risk Factors/Patient Goals on Admission    Weight Management Yes;Weight Loss   Intervention Weight Management: Develop a combined nutrition and exercise program designed to reach desired caloric intake, while maintaining appropriate intake of nutrient and fiber, sodium and fats, and appropriate energy expenditure required  for the weight goal.;Weight Management: Provide education and appropriate resources to help participant work on and attain dietary goals.   Admit Weight 147 lb 11.2 oz (67 kg)   Goal Weight: Short Term 142 lb (64.4 kg)   Goal Weight: Long Term 120 lb (54.4 kg)   Expected Outcomes Short Term: Continue to assess and modify interventions until short term weight is achieved;Long Term: Adherence to nutrition and physical activity/exercise program aimed toward attainment of established weight goal;Weight Maintenance: Understanding of the daily nutrition guidelines, which includes 25-35% calories from fat, 7% or less cal from saturated fats, less than  cholesterol, less than 1.5gm of sodium, & 5 or more servings of fruits and vegetables daily;Weight Loss: Understanding of general recommendations for a balanced deficit meal plan, which promotes 1-2 lb weight loss per week and includes a negative energy balance of [phone] kcal/d;Understanding recommendations for meals to include 15-35% energy as protein, 25-35% energy from fat, 35-60% energy from carbohydrates, less than  of dietary cholesterol, 20-35 gm of total fiber daily;Understanding of distribution of calorie intake throughout the day with the consumption of 4-5 meals/snacks   Sedentary Yes  Goal: be able to run, play sports, be active with her children   Intervention Provide advice, education, support and counseling about physical activity/exercise needs.;Develop an individualized exercise prescription for aerobic and resistive training based on initial evaluation findings, risk stratification, comorbidities and participant's personal goals.   Expected Outcomes Achievement of increased cardiorespiratory fitness and enhanced flexibility, muscular endurance and strength shown through measurements of functional capacity and personal statement of participant.   Increase Strength and Stamina Yes   Intervention Provide advice, education, support and  counseling about physical activity/exercise needs.;Develop an individualized exercise prescription for aerobic and resistive training based on initial evaluation findings, risk stratification, comorbidities and participant's personal goals.   Expected Outcomes Achievement of increased cardiorespiratory fitness and enhanced flexibility, muscular endurance and strength shown through measurements of functional capacity and personal statement of participant.   Improve shortness of breath with ADL's Yes   Intervention Provide education, individualized exercise plan and daily activity instruction to help decrease symptoms of SOB with activities of daily living.   Expected Outcomes Short Term: Achieves a reduction of symptoms when performing activities of daily living.   Develop more efficient breathing techniques such as purse lipped breathing and diaphragmatic breathing; and practicing self-pacing with activity Yes   Intervention Provide education, demonstration and support about specific breathing techniuqes utilized for more efficient breathing. Include techniques such as pursed lipped breathing, diaphragmatic breathing and self-pacing activity.   Expected Outcomes Short  Term: Participant will be able to demonstrate and use breathing techniques as needed throughout daily activities.   Increase knowledge of respiratory medications and ability to use respiratory devices properly  Yes  Qvar; spacer given with instructions.   Intervention Provide education and demonstration as needed of appropriate use of medications, inhalers, and oxygen therapy.   Expected Outcomes Short Term: Achieves understanding of medications use. Understands that oxygen is a medication prescribed by physician. Demonstrates appropriate use of inhaler and oxygen therapy.       Personal Goals Discharge:     Goals and Risk Factor Review    Row Name 04/25/16 1257             Core Components/Risk Factors/Patient Goals Review    Personal Goals Review Weight Management/Obesity;Develop more efficient breathing techniques such as purse lipped breathing and diaphragmatic breathing and practicing self-pacing with activity.;Increase Strength and Stamina       Review Britt Boozer has the info to schedule with the RD to help her reach weight loss goals.  She states she can see improvement in exercise and is managing her breathing better by slowing it down.  She feels ready to move up her levels on exercise machines next week.          Expected Outcomes Britt Boozer will continue to manage her breathing and see continued progress towards exercise goals.          Nutrition & Weight - Outcomes:     Pre Biometrics - 03/28/16 1629      Pre Biometrics   Height 5' 3.5" (1.613 m)   Weight 147 lb 11.2 oz (67 kg)   Waist Circumference 32 inches   Hip Circumference 41 inches   Waist to Hip Ratio 0.78 %   BMI (Calculated) 25.8       Nutrition:   Nutrition Discharge:   Education Questionnaire Score:     Knowledge Questionnaire Score - 03/28/16 1552      Knowledge Questionnaire Score   Pre Score 5/10      Goals reviewed with patient; copy given to patient.

## 2016-06-24 ENCOUNTER — Ambulatory Visit: Payer: Managed Care, Other (non HMO) | Admitting: Internal Medicine

## 2016-06-27 ENCOUNTER — Ambulatory Visit: Payer: Managed Care, Other (non HMO) | Admitting: Family Medicine

## 2016-06-28 ENCOUNTER — Telehealth: Payer: Self-pay | Admitting: Family Medicine

## 2016-06-28 NOTE — Telephone Encounter (Signed)
Patient did not come in for their appointment today for follow up Please let me know if patient needs to be contacted immediately for follow up or no follow up needed. Do you want to charge the NSF? °

## 2016-06-28 NOTE — Telephone Encounter (Signed)
Ok to not charge. I would recommend non-emergent follow-up

## 2016-07-12 ENCOUNTER — Encounter: Payer: Self-pay | Admitting: Internal Medicine

## 2016-07-12 ENCOUNTER — Ambulatory Visit (INDEPENDENT_AMBULATORY_CARE_PROVIDER_SITE_OTHER): Payer: Managed Care, Other (non HMO) | Admitting: Internal Medicine

## 2016-07-12 ENCOUNTER — Ambulatory Visit: Payer: Managed Care, Other (non HMO) | Admitting: Internal Medicine

## 2016-07-12 DIAGNOSIS — D860 Sarcoidosis of lung: Secondary | ICD-10-CM | POA: Diagnosis not present

## 2016-07-12 DIAGNOSIS — D72819 Decreased white blood cell count, unspecified: Secondary | ICD-10-CM | POA: Diagnosis not present

## 2016-07-12 NOTE — Assessment & Plan Note (Signed)
Could have been due to flu or methotrxate - seen April admission and resolved in 48h  Plan Expectant followup No methotrexate for now

## 2016-07-12 NOTE — Assessment & Plan Note (Signed)
Significant side effects with prednisone and methotrexate and respect your decision to stop Current symptom of sarcoid is mild shortness of breath +/- the fatigue (which could be post flu) and cxr abnromality  Plan - expectant followup (given limitation to lung and white race, we can expect slow natural resolution or stability of disease)  Followup 6 months with Dr Madison Holt - at followup based on symptoms and exam can ddecide on cxr/breathing test

## 2016-07-12 NOTE — Progress Notes (Signed)
Subjective:     Patient ID: Madison Holt, female   DOB: [birth year], 36 y.o.   MRN: [id number]  HPI    36 yo Female former smoker seen for pulmonary consult 05/10/2005 by Dr. Marchelle Gearing for shortness of breath and cough.  TEST Pulmonary function tests of 05/04/2015 normal  Autoimmune workup 05/06/2015 extensive panel is essentially normal except angiotensin-converting enzyme is elevated at 60 and c-ANCA is trace positive at 1:40 but PR 3 and MPO antibodies are negative.  05/06/2015 she also had CT chest with contrast and high resolution CT chest without contrast: There classic pulmonary infiltrates and hilar and subcarinal lymphadenopathy. Overall pattern is consistent with sarcoidosis radiologically according to the radiologist. I personally visualized this film.   Walking desat test 185 feet X 3 laps on RA -> did not desatrurate   06/29/2015 Follow up : Bx results Patient returns for a follow-up. Patient was seen last month for pulmonary consult for shortness of breath and cough and suspected sarcoid. Patient had been having cough, shortness of breath. Pulmonary function test was normal. An autoimmune panel in March was essentially negative except for cANCA that was trace positive. Ace level was elevated (PR-3 and MPO negative). CT chest showed pulmonary infiltrates and hilar and subcarinal lymphadenopathy. Patient was sent for a surgical consult. Patient underwent a bronchoscopy, endobronchial ultrasound and mediastinoscopy on 06/05/15 . Surgical pathology came back for nonnecrotizing granuloma and hyaline fibrosis in the lymph nodes. Lung biopsy showed no granulomas or malignancy. Patient was seen by [facility] care on April 7 for acute illness with chills and severe nausea. She was started on prednisone 40 mg. She says that her acute symptoms have resolved. She has not seen any change in her breathing. She continues to have fatigue, cough. She denies any rash, chest pain, orthopnea, PND,  hemoptysis, orthopnea. She says that she has gained some weight since starting prednisone. We discussed healthy dietary choices and exercise. She says that she does have some visual changes. Over the last year. Recommend that she make an appointment with ophthalmology for a annual exam and that she will need annual eye exams yearly. She is currently on prednisone 40 mg daily   REC pred and taper and hold at 10mg  per day  OV 08/10/2015  Chief Complaint  Patient presents with  . Follow-up    Pt c/o occasional dry cough and SOB/chest tightness with exertion. Pt denies wheeze/CP. Pt states that her breathing is improving. She has noticed that weather does affect her breathing, mostly with extremes in air temperature and humidity.      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 At that point in time she already been on a few weeks of prednisone at 40 mg per day. At that visit 06/29/2015 she was better. She was asked to taper her prednisone and continue at 10 mg per day. However she ran out of the bottle and she stopped taking the prednisone one week ago. However it turns out that she is reluctant to take prednisone because of weight gain. She says she has gained 30 pounds of weight which is more than her pregnancy weight gain in the past. She does admit that prednisone helped her a lot. While shortness of breath cough chest tightness was all improved with prednisone significantly but now off prednisone for weeks symptoms are returning. The heat and humidity do bother her more significantly. Per hx - eeye exam - no sarcoid  Wondering about  measures she could' do to coutneract weight gain   OV 11/16/2015  Chief Complaint  Patient presents with  . Follow-up    been off steriods for x 1 month and more sob faster, dry cough, chest pain and some tightness   Follow-up stage II pulmonary sarcoidosis-diagnosis established March  2017  Last visit June 2017. We reinitiated prednisone. However she went to the beach and forgot to take prednisone. She has not taken prednisone in over a month. With this she says that her mood swings are better. She still has some sleep issues. Being off prednisone initially she was fine but now she feels the shortness of breath and cough are coming back. She struggling between managing the side effects of prednisone and the symptoms of sarcoidosis. Last chest x-ray 06/05/2015 at the time diagnosis was made. Last pulmonary function test was in February 2017 for diagnosis was made. 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     has a past medical history of Pneumonia (2014) and Shortness of breath dyspnea.   reports that she quit smoking about 17 years ago. Her smoking use included Cigarettes. She has a 5.00 pack-year smoking history. She has never used smokeless tobacco.   OV 01/01/2016  Chief Complaint  Patient presents with  . Follow-up    Pt states she is taking Methotrexate and has not noticed a difference. Pt states the methotrexate makes her have depression, anxious, nausea, fatigued. Pt c/o dry cough - no change, chest pain - no change from last OV.    Follow-up stage II pulmonary sarcoidosis normal bony function tests spring 2017 at the time of diagnosis but significant respirator symptoms  Last visit 11/16/2015. At that visit she is reporting significant side effects especially mood swings with prednisone even though it was helping her symptoms. We opted for methotrexate 7.5 mg once weekly. She now presents for follow-up. She tells me that the methotrexate is ineffective with respirator symptoms of cough and shortness of breath. In addition she is taking it compliantly but is having some GI side effects of nausea and vomiting and also mood swings. She talk to mother was on the same medication  for rheumatoid arthritis and apparently she has similar side effects. She feels very strongly the methotrexate as the cause of the side effects. She feels extremely frustrated because of the lack of control of a sarcoid symptoms and side effects from therapy. She is up-to-date with her flu shot. She is looking for other options. She says she is gained 30 pounds or so and is now sedentary and is upset this is not her personality. She is open to attending pulmonary rehabilitation. Mentioned she is reporting significant fatigue by end of the day. She is wondering about her thyroid, vitamin D levels. Off note she is on Advair and this is causing significant tremors  Most recent lab work 11/20/2015 of CBC chemistry liver function tests all normal    Pulmonary function tests of 05/04/2015 normal  Autoimmune workup 05/06/2015 extensive panel is essentially normal except angiotensin-converting enzyme is elevated at 60 and c-ANCA is trace positive at 1:40 but PR 3 and MPO antibodies are negative.  03/29/16 Follow up : Sarcoid  Pt returns for 3 month follow up for Sarcoid . She remains on Methotrexate.  She is also on QVAR Twice daily  . No flare of cough or dyspnea.  She  is due for labs today .  She has noticed spot along inner thighs/perineal area that is hyperpigmented. No other skin rash.  We discussed going to her dermatologist for evaluation .  She is going to pulmonary rehab.  She does get occasional chest tightness that has been going on for months. She denies syncope or palpitations. 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    OV 07/12/2016  Chief Complaint  Patient presents with  . Follow-up    FOLLOW UP FOR  patient was in the hospital on June 06 2016 for the flu, Patient was released on the 4th of April. Patient has stopped taking all of her medications    Follow-up stage II pulmonary sarcoidosis started methotrexate September 2017 and dose reduced October  2017.  In the interim January 2018 she saw nurse practitioner was advised to continue methotrexate. Then in April 2018 she was admitted to [facility] with acute onset of influenza A. Review of the labs show that she had neutropenia at this time which quickly recovered. Her kidney function was normal. She says that is the first time she's had influenza. She is completely upset about how sick she got and horrible she fell. After that she stopped taking her prednisone and methotrexate completely. At this point in time she only has mild shortness of breath with exertion of class 2 or1 medicines. This is the same dyspnea free onset of influenza. She also has some exertional fatigue which is present post influenza and is only slowly improving. At this point in time she feels that the symptoms are better than handling prednisone and methotrexate. She does not want to go on these medications. Otherwise feeling well without any cough or wheezing or weight loss   IMPRESSION: Chronic reticulonodular interstitial prominence bilaterally consistent with sarcoidosis again noted. Stable linear scarring in right upper lobe. No definite superimposed infiltrate or pulmonary edema   Elect  ronically Signed   By: Natasha Mead M.D.   On: 06/06/2016 08:36   Results for SANDRINA, HEATON (MRN [id number]) as of 07/12/2016 16:40  Ref. Range 06/06/2016 08:12 06/06/2016 11:36 06/07/2016 04:51 06/07/2016 08:25 06/08/2016 05:19  Hemoglobin Latest Ref Range: 12.0 - 16.0 g/dL 40.9  81.1 91.4 78.2   Results for ALPHONSINE, MINIUM (MRN [id number]) as of 07/12/2016 16:40  Ref. Range 06/06/2016 08:12 06/06/2016 11:36 06/07/2016 04:51 06/07/2016 08:25 06/08/2016 05:19  WBC Latest Ref Range: 3.6 - 11.0 K/uL 3.4 (L)  0.8 (LL) 1.3 (LL) 5.1  Results for CARREN, BLAKLEY (MRN [id number]) as of 07/12/2016 16:40  Ref. Range 06/06/2016 08:12 06/06/2016 11:36 06/07/2016 04:51 06/07/2016 08:25 06/08/2016 05:19  Creatinine Latest Ref Range: 0.44 - 1.00 mg/dL 5.28  (H)  4.13      has a past medical history of Pneumonia (2014); Sarcoidosis; and Shortness of breath dyspnea.   reports that she quit smoking about 18 years ago. Her smoking use included Cigarettes. She has a 5.00 pack-year smoking history. She has never used smokeless tobacco.  Past Surgical History:  Procedure Laterality Date  . ESOPHAGOGASTRODUODENOSCOPY    . MEDIASTINOSCOPY N/A 06/05/2015   Procedure:  MEDIASTINOSCOPY;  Surgeon: Loreli Slot, MD;  Location: [facility] OR;  Service: Thoracic;  Laterality: N/A;  . VIDEO BRONCHOSCOPY WITH ENDOBRONCHIAL NAVIGATION N/A 06/05/2015   Procedure: VIDEO BRONCHOSCOPY WITH ENDOBRONCHIAL NAVIGATION;  Surgeon: Loreli Slot, MD;  Location: [facility] OR;  Service: Thoracic;  Laterality: N/A;  . VIDEO BRONCHOSCOPY WITH ENDOBRONCHIAL ULTRASOUND N/A 06/05/2015  Procedure: VIDEO BRONCHOSCOPY WITH ENDOBRONCHIAL ULTRASOUND;  Surgeon: Loreli Slot, MD;  Location: [facility] OR;  Service: Thoracic;  Laterality: N/A;    Allergies  Allergen Reactions  . Codeine Other (See Comments)    Other reaction(s): Hallucination    Immunization History  Administered Date(s) Administered  . Influenza,inj,Quad PF,36+ Mos 12/21/2015    Family History  Problem Relation Age of Onset  . Arthritis Mother   . Hyperlipidemia Mother   . Hyperlipidemia Father   . Diabetes Father   . Breast cancer Maternal Grandmother   . Hypertension Maternal Grandmother   . Colon cancer Maternal Grandfather   . Hypertension Maternal Grandfather   . Heart disease Paternal Grandfather      Current Outpatient Prescriptions:  .  acetaminophen (TYLENOL) 325 MG tablet, Take 650 mg by mouth every 6 (six) hours as needed for mild pain or headache. , Disp: , Rfl:  .  beclomethasone (QVAR) 40 MCG/ACT inhaler, Inhale 2 puffs into the lungs 2 (two) times daily., Disp: , Rfl:  .  busPIRone (BUSPAR) 10 MG tablet, Take 0.5 tablets (5 mg total) by mouth 2 (two) times daily. (Patient not taking:  Reported on 07/12/2016), Disp: 60 tablet, Rfl: 0 .  Cholecalciferol (VITAMIN D3) 50000 units CAPS, Take 50,000 Units by mouth once a week. (Patient not taking: Reported on 07/12/2016), Disp: 4 capsule, Rfl: 2 .  folic acid (FOLVITE) 1 MG tablet, Take 1 tablet (1 mg total) by mouth daily. (Patient not taking: Reported on 07/12/2016), Disp: 30 tablet, Rfl: 5 .  LORazepam (ATIVAN) 0.5 MG tablet, Take 1 tablet (0.5 mg total) by mouth every 8 (eight) hours as needed for anxiety. (Patient not taking: Reported on 07/12/2016), Disp: 30 tablet, Rfl: 0 .  methotrexate (RHEUMATREX) 2.5 MG tablet, Take 3 tablets (7.5 mg total) by mouth once a week. Caution:Chemotherapy. Protect from light. (Patient not taking: Reported on 07/12/2016), Disp: 12 tablet, Rfl: 5 .  oseltamivir (TAMIFLU) 75 MG capsule, Take 1 capsule (75 mg total) by mouth 2 (two) times daily. (Patient not taking: Reported on 07/12/2016), Disp: 6 capsule, Rfl: 0   Review of Systems     Objective:   Physical Exam  Constitutional: She is oriented to person, place, and time. She appears well-developed and well-nourished. No distress.  HENT:  Head: Normocephalic and atraumatic.  Right Ear: External ear normal.  Left Ear: External ear normal.  Mouth/Throat: Oropharynx is clear and moist. No oropharyngeal exudate.  Eyes: Conjunctivae and EOM are normal. Pupils are equal, round, and reactive to light. Right eye exhibits no discharge. Left eye exhibits no discharge. No scleral icterus.  Neck: Normal range of motion. Neck supple. No JVD present. No tracheal deviation present. No thyromegaly present.  Cardiovascular: Normal rate, regular rhythm, normal heart sounds and intact distal pulses.  Exam reveals no gallop and no friction rub.   No murmur heard. Pulmonary/Chest: Effort normal and breath sounds normal. No respiratory distress. She has no wheezes. She has no rales. She exhibits no tenderness.  Abdominal: Soft. Bowel sounds are normal. She exhibits no  distension and no mass. There is no tenderness. There is no rebound and no guarding.  Musculoskeletal: Normal range of motion. She exhibits no edema or tenderness.  Lymphadenopathy:    She has no cervical adenopathy.  Neurological: She is alert and oriented to person, place, and time. She has normal reflexes. No cranial nerve deficit. She exhibits normal muscle tone. Coordination normal.  Skin: Skin is warm and dry. No rash noted.  She is not diaphoretic. No erythema. No pallor.  Psychiatric: She has a normal mood and affect. Her behavior is normal. Judgment and thought content normal.  Vitals reviewed.  Vitals:   07/12/16 1629  BP: 102/78  Pulse: 88  Resp: 16  SpO2: 98%  Weight: 138 lb 12.8 oz (63 kg)  Height: 5\' 3"  (1.6 m)       Assessment:       ICD-9-CM ICD-10-CM   1. Pulmonary sarcoidosis (HCC) 135 D86.0    517.8    2. Leukopenia, unspecified type 288.50 D72.819        Plan:     Pulmonary sarcoidosis (HCC) Significant side effects with prednisone and methotrexate and respect your decision to stop Current symptom of sarcoid is mild shortness of breath +/- the fatigue (which could be post flu) and cxr abnromality  Plan - expectant followup (given limitation to lung and white race, we can expect slow natural resolution or stability of disease)  Followup 6 months with Dr Marchelle Gearingamaswamy - at followup based on symptoms and exam can ddecide on cxr/breathing test  Leukopenia Could have been due to flu or methotrxate - seen April admission and resolved in 48h  Plan Expectant followup No methotrexate for now  (> 50% of this 15 min visit spent in face to face counseling or/and coordination of care)   Dr. Kalman ShanMurali Gwendolyn Mclees, M.D., [facility]C.P Pulmonary and Critical Care Medicine Staff Physician [facility] Pager: [phone], If no answer or between  15:00h - 7:00h: call [phone]  07/12/2016 5:03 PM

## 2016-07-12 NOTE — Patient Instructions (Signed)
Pulmonary sarcoidosis (HCC) Significant side effects with prednisone and methotrexate and respect your decision to stop Current symptom of sarcoid is mild shortness of breath +/- the fatigue (which could be post flu) and cxr abnromality  Plan - expectant followup (given limitation to lung and white race, we can expect slow natural resolution or stability of disease)  Followup 6 months with Dr Marchelle Gearingamaswamy - at followup based on symptoms and exam can ddecide on cxr/breathing test

## 2016-08-11 ENCOUNTER — Telehealth: Payer: Self-pay | Admitting: Internal Medicine

## 2016-08-11 NOTE — Telephone Encounter (Signed)
She went to an urgent caer and the doctor there - Dr Stephens ShireKim Glenn Lykins wants us to see this patient Madison BushmanJennifer L Leidy a bit sooner. Give fu in 3-4 months instead of 6 months  Dr. Kalman ShanMurali Kareli Hossain, M.D., [facility]C.P Pulmonary and Critical Care Medicine Staff Physician [facility] [place] [facility] Critical [facility] Pager: [phone], If no answer or between  15:00h - 7:00h: call [phone]  08/11/2016 2:32 AM

## 2016-08-11 NOTE — Telephone Encounter (Signed)
ATC pt, VM box is full. WCB. 

## 2016-08-15 NOTE — Telephone Encounter (Signed)
Pt scheduled for 10/28/2016 at 09:30am...ert

## 2016-09-26 NOTE — H&P (Signed)
Patient ID: Madison Holt is a 36 y.o. female G3 with 3 NSVDs   HPI: Menometrorrhagia, failed medical management, desiring definitive management.  Hx of sarcoidosis, not on chronic steroids Hx of NSVD x3  Pap neg 09/21/16 EMBx collected 09/26/16 SIS: Normal stripe with possible uterine septum, but no evidence of cancer or space-filling lesion  Past Medical History:  has a past medical history of Abnormal uterine bleeding, unspecified and Sarcoidosis of lung (CMS-HCC).  Past Surgical History:  has a past surgical history that includes percutaneous biopsy lung (2017). Family History: family history includes Breast cancer in her maternal aunt and maternal grandmother; Diabetes in her father; Heart disease in her paternal grandfather; Hypotension in her father; No Known Problems in her daughter and son. Social History:  reports that she has quit smoking. She has never used smokeless tobacco. She reports that she drinks alcohol. She reports that she does not use drugs. OB/GYN History:          OB History    Gravida Para Term Preterm AB Living   4 3 3   1 3    SAB TAB Ectopic Molar Multiple Live Births                    Allergies: is allergic to codeine. Medications:  Current Outpatient Prescriptions:  .  acetaminophen (TYLENOL) 500 mg capsule, Take 500 mg by mouth every 4 (four) hours as needed for Fever., Disp: , Rfl:    Review of Systems: No SOB, no palpitations or chest pain, no new lower extremity edema, no nausea or vomiting or bowel or bladder complaints. See HPI for gyn specific ROS.   Exam:   BP 119/87   Pulse 92   Ht 160 cm (5\' 3" )   Wt 64.4 kg (142 lb)   LMP 09/18/2016 (Exact Date)   BMI 25.15 kg/m   General: Patient is well-groomed, well-nourished, appears stated age in no acute distress  HEENT: head is atraumatic and normocephalic, trachea is midline, neck is supple with no palpable nodules  CV: Regular rhythm and normal heart rate, no  murmur  Pulm: Clear to auscultation throughout lung fields with no wheezing, crackles, or rhonchi. No increased work of breathing  Abdomen: soft , no mass, non-tender, no rebound tenderness, no hepatomegaly  Pelvic: tanner stage 5 ,              External genitalia: vulva /labia no lesions             Urethra: no prolapse             Vagina: normal physiologic d/c, laxity in vaginal walls             Cervix: no lesions, no cervical motion tenderness, good descent             Uterus: normal size shape and contour, non-tender             Adnexa: no mass,  non-tender               Rectovaginal: External wnl   Impression:   The encounter diagnosis was Excessive or frequent menstruation.    Plan:   Patient returns for a preoperative discussion regarding her plans to proceed with surgical treatment of her AUB by total vaginal hysterectomy with bilateral salpingectomy procedure. Possible cystoscopy if needed.  The patient and I discussed the technical aspects of the procedure including the potential for risks and complications. These include but are not limited  to the risk of infection requiring post-operative antibiotics or further procedures. We talked about the risk of injury to adjacent organs including bladder, bowel, ureter, blood vessels or nerves. We talked about the need to convert to an open incision. We talked about the possible need for blood transfusion. We talked aboutpostop complications such asthromboembolic or cardiopulmonary complications. All of her questions were answered.  Her preoperative exam was completed and the appropriate consents were signed. She is scheduled to undergo this procedure in the near future.  Specific Peri-operative Considerations:  - Consent: obtained today - Health Maintenance: up to date - Labs: CBC, CMP preoperatively - Bowel Preparation: None required - Abx:  Cefoxitin 2g - VTE ppx: SCDs perioperatively - Glucose Protocol:  n/a - Beta-blockade: n/a

## 2016-09-27 ENCOUNTER — Encounter
Admission: RE | Admit: 2016-09-27 | Discharge: 2016-09-27 | Disposition: A | Discharge status: Home / Self Care | Payer: 59 | Source: Ambulatory Visit | Attending: Obstetrics and Gynecology | Admitting: Obstetrics and Gynecology

## 2016-09-27 ENCOUNTER — Telehealth: Payer: Self-pay | Admitting: Internal Medicine

## 2016-09-27 DIAGNOSIS — Z01812 Encounter for preprocedural laboratory examination: Secondary | ICD-10-CM | POA: Diagnosis present

## 2016-09-27 LAB — BASIC METABOLIC PANEL
Anion gap: 7 (ref 5–15)
BUN: 14 mg/dL (ref 6–20)
CHLORIDE: 102 mmol/L (ref 101–111)
CO2: 28 mmol/L (ref 22–32)
CREATININE: 0.96 mg/dL (ref 0.44–1.00)
Calcium: 9.4 mg/dL (ref 8.9–10.3)
GFR calc non Af Amer: >60 mL/min (ref >60)
Glucose, Bld: 93 mg/dL (ref 65–99)
POTASSIUM: 4.1 mmol/L (ref 3.5–5.1)
SODIUM: 137 mmol/L (ref 135–145)

## 2016-09-27 LAB — CBC
HEMATOCRIT: 40.5 % (ref 35.0–47.0)
Hemoglobin: 14.1 g/dL (ref 12.0–16.0)
MCH: 29.9 pg (ref 26.0–34.0)
MCHC: 34.8 g/dL (ref 32.0–36.0)
MCV: 86.1 fL (ref 80.0–100.0)
Platelets: 233 10*3/uL (ref 150–440)
RBC: 4.7 MIL/uL (ref 3.80–5.20)
RDW: 12.5 % (ref 11.5–14.5)
WBC: 4.3 10*3/uL (ref 3.6–11.0)

## 2016-09-27 LAB — TYPE AND SCREEN
ABO/RH(D): O POS
Antibody Screen: NEGATIVE

## 2016-09-27 NOTE — Patient Instructions (Signed)
  Your procedure is scheduled on: 10/10/16 Report to Day Surgery. [facility] SECOND FLOOR To find out your arrival time please call [phone] between 1PM - 3PM on 10/07/16  Remember: Instructions that are not followed completely may result in serious medical risk, up to and including death, or upon the discretion of your surgeon and anesthesiologist your surgery may need to be rescheduled.    _X___ 1. Do not eat food or drink liquids after midnight. No gum chewing or hard candies.     _X___ 2. No Alcohol for 24 hours before or after surgery.   ____ 3. Do Not Smoke For 24 Hours Prior to Your Surgery.   ____ 4. Bring all medications with you on the day of surgery if instructed.    __X_ 5. Notify your doctor if there is any change in your medical condition     (cold, fever, infections).       Do not wear jewelry, make-up, hairpins, clips or nail polish.  Do not wear lotions, powders, or perfumes. You may wear deodorant.  Do not shave 48 hours prior to surgery. Men may shave face and neck.  Do not bring valuables to the hospital.    [facility] responsible for any belongings or [facility]ables.               Contacts, dentures or bridgework may not be worn into surgery.  Leave your suitcase in the car. After surgery it may be brought to your room.  For patients admitted to the hospital, discharge time is determined by your                treatment team.   Patients discharged the day of surgery will not be allowed to drive home.   Please read over the following fact sheets that you were given:     ____ Take these medicines the morning of surgery with A SIP OF WATER:    1.NONE  2.   3.   4.  5.  6.  ____ Fleet Enema (as directed)   ____ Use CHG Soap as directed  _X___ Use inhalers on the day of surgery  ____ Stop metformin 2 days prior to surgery    ____ Take 1/2 of usual insulin dose the night before surgery and none on the morning of surgery.   ____ Stop  Coumadin/Plavix/aspirin on  ____ Stop Anti-inflammatories on    ____ Stop supplements until after surgery.    ____ Bring C-Pap to the hospital.     PRACTICE SPIROMETRY AND BRING DAY OF SURGERY

## 2016-09-27 NOTE — Telephone Encounter (Signed)
Spoke with pt. States that she is having surgery on 10/10/2016. She did her pre-admit this AM and was told that we were going to be sent a form for MR to give clearance. Will await this form.

## 2016-09-27 NOTE — Pre-Procedure Instructions (Addendum)
SEEN BY DR PARASCHOS 09/08/16. PATIENT STOPPED MEDS WITH PULMONARY APPROVAL.NEG STRESS/ECHO 2/18  CLEARANCE REQUEST AS INSTRUCTED BY DR J ADAMS, CALLED AND FAXED TO DR RAMASWAMY. SPOKE WITH PATRICE. PATIENT AWARE NOTE BEING REQUESTED

## 2016-09-28 NOTE — Telephone Encounter (Signed)
Completed form faxed to [phone]. ATC pt but it is past working hours, no VM. WCB.

## 2016-09-28 NOTE — Telephone Encounter (Signed)
What surgery is she having? Who is the surgeon? Where is the surgery?  Dr. Kalman ShanMurali Deeana Atwater, M.D., [facility].P Pulmonary and Critical Care Medicine Staff Physician [facility] [place] [facility] Pager: [phone], If no answer or between  15:00h - 7:00h: call [phone]  09/28/2016 11:07 AM

## 2016-09-28 NOTE — Telephone Encounter (Signed)
MR  Pt is having a vaginal hysterectomy which is scheduled for 10/10/16 and the surgeon, according to Epic looks like Dr. Dalbert GarnetBeasley. Robynn Panelise has form

## 2016-09-28 NOTE — Telephone Encounter (Signed)
Surgical clearance done - low risk. [facility]  Dr. Kalman ShanMurali Nancyann Cotterman, M.D., [facility] Critical [facility] Medicine Staff Physician [facility] Pager: [phone], If no answer or between  15:00h - 7:00h: call [phone]  09/28/2016 5:11 PM

## 2016-09-28 NOTE — Telephone Encounter (Signed)
I checked and the form is in MR's lookat cubby  Please advise once completed

## 2016-09-29 NOTE — Telephone Encounter (Signed)
Called and spoke to [name]. Informed her that the form has been faxed. Pt verbalized understanding and denied any further questions or concerns at this time.

## 2016-09-29 NOTE — Pre-Procedure Instructions (Signed)
CLEARED LOW RISK BY PULMONARY 09/29/16

## 2016-10-09 MED ORDER — CEFAZOLIN SODIUM-DEXTROSE 2-4 GM/100ML-% IV SOLN
2.0000 g | INTRAVENOUS | Status: AC
  Administered 2016-10-10: 2 g via INTRAVENOUS

## 2016-10-10 ENCOUNTER — Observation Stay
Admission: RE | Admit: 2016-10-10 | Discharge: 2016-10-11 | Disposition: A | Discharge status: Home / Self Care | Payer: 59 | Source: Ambulatory Visit | Attending: Obstetrics and Gynecology | Admitting: Obstetrics and Gynecology

## 2016-10-10 ENCOUNTER — Encounter
Admission: RE | Disposition: A | Discharge status: Home / Self Care | Payer: Self-pay | Source: Ambulatory Visit | Attending: Obstetrics and Gynecology

## 2016-10-10 ENCOUNTER — Encounter: Payer: Self-pay | Admitting: *Deleted

## 2016-10-10 ENCOUNTER — Ambulatory Visit: Payer: 59 | Admitting: Anesthesiology

## 2016-10-10 DIAGNOSIS — N838 Other noninflammatory disorders of ovary, fallopian tube and broad ligament: Secondary | ICD-10-CM | POA: Diagnosis not present

## 2016-10-10 DIAGNOSIS — D869 Sarcoidosis, unspecified: Secondary | ICD-10-CM | POA: Insufficient documentation

## 2016-10-10 DIAGNOSIS — N888 Other specified noninflammatory disorders of cervix uteri: Secondary | ICD-10-CM | POA: Insufficient documentation

## 2016-10-10 DIAGNOSIS — Z885 Allergy status to narcotic agent status: Secondary | ICD-10-CM | POA: Diagnosis not present

## 2016-10-10 DIAGNOSIS — N921 Excessive and frequent menstruation with irregular cycle: Secondary | ICD-10-CM | POA: Diagnosis present

## 2016-10-10 DIAGNOSIS — N800 Endometriosis of uterus: Secondary | ICD-10-CM | POA: Diagnosis not present

## 2016-10-10 DIAGNOSIS — Z8249 Family history of ischemic heart disease and other diseases of the circulatory system: Secondary | ICD-10-CM | POA: Diagnosis not present

## 2016-10-10 DIAGNOSIS — Z87891 Personal history of nicotine dependence: Secondary | ICD-10-CM | POA: Insufficient documentation

## 2016-10-10 DIAGNOSIS — N939 Abnormal uterine and vaginal bleeding, unspecified: Secondary | ICD-10-CM | POA: Diagnosis present

## 2016-10-10 DIAGNOSIS — F419 Anxiety disorder, unspecified: Secondary | ICD-10-CM | POA: Insufficient documentation

## 2016-10-10 HISTORY — PX: VAGINAL HYSTERECTOMY: SHX2639

## 2016-10-10 HISTORY — PX: BILATERAL SALPINGECTOMY: SHX5743

## 2016-10-10 LAB — POCT PREGNANCY, URINE: PREG TEST UR: NEGATIVE

## 2016-10-10 LAB — ABO/RH: ABO/RH(D): O POS

## 2016-10-10 SURGERY — HYSTERECTOMY, VAGINAL
Anesthesia: General

## 2016-10-10 MED ORDER — SUGAMMADEX SODIUM 200 MG/2ML IV SOLN
INTRAVENOUS | Status: DC | PRN
  Administered 2016-10-10: 127 mg via INTRAVENOUS

## 2016-10-10 MED ORDER — DEXAMETHASONE SODIUM PHOSPHATE 10 MG/ML IJ SOLN
INTRAMUSCULAR | Status: DC | PRN
  Administered 2016-10-10: 10 mg via INTRAVENOUS

## 2016-10-10 MED ORDER — ONDANSETRON HCL 4 MG PO TABS: 4.0000 mg | ORAL_TABLET | Freq: Four times a day (QID) | ORAL | Status: DC | PRN

## 2016-10-10 MED ORDER — CEFAZOLIN SODIUM-DEXTROSE 2-4 GM/100ML-% IV SOLN
INTRAVENOUS | Status: AC
  Filled 2016-10-10: qty 100

## 2016-10-10 MED ORDER — ROCURONIUM BROMIDE 100 MG/10ML IV SOLN
INTRAVENOUS | Status: DC | PRN
Start: 2016-10-10 — End: 2016-10-10
  Administered 2016-10-10: 40 mg via INTRAVENOUS

## 2016-10-10 MED ORDER — LIDOCAINE HCL (PF) 2 % IJ SOLN
INTRAMUSCULAR | Status: AC
  Filled 2016-10-10: qty 2

## 2016-10-10 MED ORDER — PHENYLEPHRINE HCL 10 MG/ML IJ SOLN
INTRAMUSCULAR | Status: DC | PRN
  Administered 2016-10-10 (×3): 100 ug via INTRAVENOUS
  Administered 2016-10-10: 200 ug via INTRAVENOUS
  Administered 2016-10-10: 100 ug via INTRAVENOUS

## 2016-10-10 MED ORDER — OXYCODONE HCL 5 MG PO TABS
5.0000 mg | ORAL_TABLET | ORAL | Status: DC | PRN
  Administered 2016-10-10 – 2016-10-11 (×5): 5 mg via ORAL
  Filled 2016-10-10 (×6): qty 1

## 2016-10-10 MED ORDER — ONDANSETRON HCL 4 MG/2ML IJ SOLN
INTRAMUSCULAR | Status: DC | PRN
  Administered 2016-10-10: 4 mg via INTRAVENOUS

## 2016-10-10 MED ORDER — ROCURONIUM BROMIDE 50 MG/5ML IV SOLN
INTRAVENOUS | Status: AC
  Filled 2016-10-10: qty 1

## 2016-10-10 MED ORDER — ONDANSETRON HCL 4 MG/2ML IJ SOLN: 4.0000 mg | Freq: Once | INTRAMUSCULAR | Status: DC | PRN

## 2016-10-10 MED ORDER — LIDOCAINE-EPINEPHRINE 1 %-1:100000 IJ SOLN
INTRAMUSCULAR | Status: DC | PRN
  Administered 2016-10-10: 10 mL

## 2016-10-10 MED ORDER — GABAPENTIN 300 MG PO CAPS
900.0000 mg | ORAL_CAPSULE | Freq: Every day | ORAL | Status: DC
  Administered 2016-10-10: 900 mg via ORAL
  Filled 2016-10-10: qty 3

## 2016-10-10 MED ORDER — MENTHOL 3 MG MT LOZG
1.0000 | LOZENGE | OROMUCOSAL | Status: DC | PRN
  Filled 2016-10-10: qty 9

## 2016-10-10 MED ORDER — ACETAMINOPHEN 500 MG PO TABS
ORAL_TABLET | ORAL | Status: AC
  Administered 2016-10-10: 1000 mg via ORAL
  Filled 2016-10-10: qty 2

## 2016-10-10 MED ORDER — LACTATED RINGERS IV SOLN
INTRAVENOUS | Status: DC
  Administered 2016-10-10 (×2): via INTRAVENOUS

## 2016-10-10 MED ORDER — EPHEDRINE SULFATE 50 MG/ML IJ SOLN
INTRAMUSCULAR | Status: DC | PRN
  Administered 2016-10-10 (×2): 5 mg via INTRAVENOUS

## 2016-10-10 MED ORDER — ONDANSETRON HCL 4 MG/2ML IJ SOLN: 4.0000 mg | Freq: Four times a day (QID) | INTRAMUSCULAR | Status: DC | PRN

## 2016-10-10 MED ORDER — MIDAZOLAM HCL 2 MG/2ML IJ SOLN
INTRAMUSCULAR | Status: DC | PRN
  Administered 2016-10-10: 2 mg via INTRAVENOUS

## 2016-10-10 MED ORDER — DEXAMETHASONE SODIUM PHOSPHATE 10 MG/ML IJ SOLN
INTRAMUSCULAR | Status: AC
  Filled 2016-10-10: qty 1

## 2016-10-10 MED ORDER — GABAPENTIN 300 MG PO CAPS
ORAL_CAPSULE | ORAL | Status: AC
  Administered 2016-10-10: 900 mg via ORAL
  Filled 2016-10-10: qty 3

## 2016-10-10 MED ORDER — FENTANYL CITRATE (PF) 100 MCG/2ML IJ SOLN
25.0000 ug | INTRAMUSCULAR | Status: DC | PRN
  Administered 2016-10-10 (×4): 25 ug via INTRAVENOUS

## 2016-10-10 MED ORDER — FAMOTIDINE 20 MG PO TABS
ORAL_TABLET | ORAL | Status: AC
  Administered 2016-10-10: 20 mg
  Filled 2016-10-10: qty 1

## 2016-10-10 MED ORDER — LACTATED RINGERS IV SOLN: INTRAVENOUS | Status: DC

## 2016-10-10 MED ORDER — ONDANSETRON HCL 4 MG/2ML IJ SOLN
INTRAMUSCULAR | Status: AC
  Filled 2016-10-10: qty 2

## 2016-10-10 MED ORDER — HYDROMORPHONE HCL 1 MG/ML IJ SOLN
1.0000 mg | Freq: Once | INTRAMUSCULAR | Status: AC
  Administered 2016-10-10: 1 mg via INTRAVENOUS

## 2016-10-10 MED ORDER — ACETAMINOPHEN 500 MG PO TABS
1000.0000 mg | ORAL_TABLET | Freq: Four times a day (QID) | ORAL | Status: DC
  Administered 2016-10-10 – 2016-10-11 (×3): 1000 mg via ORAL
  Filled 2016-10-10 (×4): qty 2

## 2016-10-10 MED ORDER — FENTANYL CITRATE (PF) 100 MCG/2ML IJ SOLN
INTRAMUSCULAR | Status: AC
  Filled 2016-10-10: qty 2

## 2016-10-10 MED ORDER — CELECOXIB 200 MG PO CAPS
200.0000 mg | ORAL_CAPSULE | Freq: Two times a day (BID) | ORAL | Status: DC
  Administered 2016-10-10 – 2016-10-11 (×3): 200 mg via ORAL
  Filled 2016-10-10 (×3): qty 1

## 2016-10-10 MED ORDER — BUDESONIDE 0.25 MG/2ML IN SUSP
0.2500 mg | Freq: Two times a day (BID) | RESPIRATORY_TRACT | Status: DC | PRN
  Filled 2016-10-10: qty 2

## 2016-10-10 MED ORDER — MIDAZOLAM HCL 2 MG/2ML IJ SOLN
INTRAMUSCULAR | Status: AC
  Filled 2016-10-10: qty 2

## 2016-10-10 MED ORDER — BUDESONIDE 0.25 MG/2ML IN SUSP
0.2500 mg | Freq: Two times a day (BID) | RESPIRATORY_TRACT | Status: DC
  Filled 2016-10-10: qty 2

## 2016-10-10 MED ORDER — LIDOCAINE HCL (CARDIAC) 20 MG/ML IV SOLN
INTRAVENOUS | Status: DC | PRN
  Administered 2016-10-10: 70 mg via INTRAVENOUS

## 2016-10-10 MED ORDER — PROPOFOL 10 MG/ML IV BOLUS
INTRAVENOUS | Status: DC | PRN
  Administered 2016-10-10: 150 mg via INTRAVENOUS

## 2016-10-10 MED ORDER — SUGAMMADEX SODIUM 200 MG/2ML IV SOLN
INTRAVENOUS | Status: AC
  Filled 2016-10-10: qty 2

## 2016-10-10 MED ORDER — DOCUSATE SODIUM 100 MG PO CAPS
100.0000 mg | ORAL_CAPSULE | Freq: Two times a day (BID) | ORAL | Status: DC
  Administered 2016-10-10 – 2016-10-11 (×2): 100 mg via ORAL
  Filled 2016-10-10 (×2): qty 1

## 2016-10-10 MED ORDER — ACETAMINOPHEN 500 MG PO TABS
1000.0000 mg | ORAL_TABLET | ORAL | Status: AC
  Administered 2016-10-10: 1000 mg via ORAL

## 2016-10-10 MED ORDER — GABAPENTIN 300 MG PO CAPS
900.0000 mg | ORAL_CAPSULE | ORAL | Status: AC
  Administered 2016-10-10: 900 mg via ORAL

## 2016-10-10 MED ORDER — MIDAZOLAM HCL 2 MG/2ML IJ SOLN
1.0000 mg | Freq: Once | INTRAMUSCULAR | Status: AC
  Administered 2016-10-10: 1 mg via INTRAVENOUS

## 2016-10-10 MED ORDER — HYDROMORPHONE HCL 1 MG/ML IJ SOLN
INTRAMUSCULAR | Status: AC
  Filled 2016-10-10: qty 1

## 2016-10-10 MED ORDER — LIDOCAINE-EPINEPHRINE 1 %-1:100000 IJ SOLN
INTRAMUSCULAR | Status: AC
  Filled 2016-10-10: qty 1

## 2016-10-10 MED ORDER — FENTANYL CITRATE (PF) 100 MCG/2ML IJ SOLN
INTRAMUSCULAR | Status: DC | PRN
  Administered 2016-10-10 (×2): 50 ug via INTRAVENOUS

## 2016-10-10 SURGICAL SUPPLY — 30 items
BAG URO DRAIN 2000ML W/SPOUT (MISCELLANEOUS) ×3 IMPLANT
CANISTER SUCT 1200ML W/VALVE (MISCELLANEOUS) ×3 IMPLANT
CATH FOLEY 2WAY  5CC 16FR (CATHETERS) ×1
CATH URTH 16FR FL 2W BLN LF (CATHETERS) ×2 IMPLANT
DRAPE PERI LITHO V/GYN (MISCELLANEOUS) ×3 IMPLANT
DRAPE SURG 17X11 SM STRL (DRAPES) ×3 IMPLANT
DRAPE UNDER BUTTOCK W/FLU (DRAPES) ×3 IMPLANT
ELECT REM PT RETURN 9FT ADLT (ELECTROSURGICAL) ×3
ELECTRODE REM PT RTRN 9FT ADLT (ELECTROSURGICAL) ×2 IMPLANT
GLOVE BIO SURGEON STRL SZ7 (GLOVE) ×3 IMPLANT
GLOVE INDICATOR 7.5 STRL GRN (GLOVE) ×3 IMPLANT
GOWN STRL REUS W/ TWL LRG LVL3 (GOWN DISPOSABLE) ×6 IMPLANT
GOWN STRL REUS W/ TWL XL LVL3 (GOWN DISPOSABLE) ×2 IMPLANT
GOWN STRL REUS W/TWL LRG LVL3 (GOWN DISPOSABLE) ×3
GOWN STRL REUS W/TWL XL LVL3 (GOWN DISPOSABLE) ×1
KIT RM TURNOVER CYSTO AR (KITS) ×3 IMPLANT
LABEL OR SOLS (LABEL) ×3 IMPLANT
NDL SAFETY 22GX1.5 (NEEDLE) ×3 IMPLANT
PACK BASIN MINOR ARMC (MISCELLANEOUS) ×3 IMPLANT
PAD OB MATERNITY 4.3X12.25 (PERSONAL CARE ITEMS) ×3 IMPLANT
PAD PREP 24X41 OB/GYN DISP (PERSONAL CARE ITEMS) ×3 IMPLANT
SUT PDS 2-0 27IN (SUTURE) ×3 IMPLANT
SUT VIC AB 0 CT1 27 (SUTURE) ×2
SUT VIC AB 0 CT1 27XCR 8 STRN (SUTURE) ×4 IMPLANT
SUT VIC AB 0 CT1 36 (SUTURE) ×6 IMPLANT
SUT VIC AB 2-0 SH 27 (SUTURE) ×3
SUT VIC AB 2-0 SH 27XBRD (SUTURE) ×6 IMPLANT
SYR CONTROL 10ML (SYRINGE) ×3 IMPLANT
SYRINGE 10CC LL (SYRINGE) ×3 IMPLANT
WATER STERILE IRR 1000ML POUR (IV SOLUTION) ×3 IMPLANT

## 2016-10-10 NOTE — Discharge Instructions (Signed)
Discharge instructions after  vaginal hysterectomy  Signs and Symptoms to Report Call our office at [phone] if you have any of the following.   Fever over 100.4 degrees or higher  Severe stomach pain not relieved with pain medications  Bright red bleeding thats heavier than a period that does not slow with rest  To go the bathroom a lot (frequency), you cant hold your urine (urgency), or it hurts when you empty your bladder (urinate)  Chest pain  Shortness of breath  Pain in the calves of your legs  Severe nausea and vomiting not relieved with anti-nausea medications  Signs of infection around your wounds, such as redness, hot to touch, swelling, green/yellow drainage (like pus), bad smelling discharge  Any concerns  What You Can Expect after Surgery  You may see some pink tinged, bloody fluid and bruising around the wound. This is normal.  You may have a sore throat because of the tube in your mouth during general anesthesia. This will go away in 2 to 3 days.  You may have some stomach cramps.  You may notice spotting on your panties.   Activities after Your Discharge Follow these guidelines to help speed your recovery at home:  Do the coughing and deep breathing as you did in the hospital for 2 weeks. Use the small blue breathing device, called the incentive spirometer for 2 weeks.  Dont drive if you are in pain or taking narcotic pain medicine. You may drive when you can safely slam on the brakes, turn the wheel forcefully, and rotate your torso comfortably. This is typically 1-2 weeks. Practice in a parking lot or side street prior to attempting to drive regularly.   Ask others to help with household chores for 4 weeks.  Do not lift anything heavier that 10 pounds for 4-6 weeks. This includes pets, children, and groceries.  Dont do strenuous activities, exercises, or sports like vacuuming, tennis, squash, etc. until your doctor says it is safe to do  so. ---Maintain pelvic rest for 8 weeks. This means nothing in the vagina or rectum at all (no douching, tampons, intercourse) for 8 weeks.   Walk as you feel able. Rest often since it may take two or three weeks for your energy level to return to normal.   You may climb stairs  Avoid constipation:   -Eat fruits, vegetables, and whole grains. Eat small meals as your appetite will take time to return to normal.   -Drink 6 to 8 glasses of water each day unless your doctor has told you to limit your fluids.   -Use a laxative or stool softener as needed if constipation becomes a problem. You may take Miralax, metamucil, Citrucil, Colace, Senekot, FiberCon, etc. If this does not relieve the constipation, try two tablespoons of Milk Of Magnesia every 8 hours until your bowels move.   You may shower. Gently wash the wounds with a mild soap and water. Pat dry.  Do not get in a hot tub, swimming pool, etc. for 6 weeks.  Do not use lotions, oils, powders on the wounds.  Do not douche, use tampons, or have sex until your doctor says it is okay.  Take your pain medicine when you need it. The medicine may not work as well if the pain is bad.  Take the medicines you were taking before surgery. Other medications you will need are pain medications (Norco or Percocet) and nausea medications (Zofran).

## 2016-10-10 NOTE — Anesthesia Postprocedure Evaluation (Signed)
Anesthesia Post Note  Patient: Madison BushmanJennifer L Holt  Procedure(s) Performed: Procedure(s) (LRB): HYSTERECTOMY VAGINAL (N/A) BILATERAL SALPINGECTOMY (Bilateral)  Patient location during evaluation: PACU Anesthesia Type: General Level of consciousness: awake Pain management: pain level controlled Vital Signs Assessment: post-procedure vital signs reviewed and stable Respiratory status: nonlabored ventilation Cardiovascular status: stable Anesthetic complications: no     Last Vitals:  Vitals:   10/10/16 1203 10/10/16 1218  BP: 99/63 99/72  Pulse: 77 82  Resp: 12 12  Temp: 37.1 C     Last Pain:  Vitals:   10/10/16 1218  TempSrc:   PainSc: Asleep                 VAN STAVEREN,Vester Titsworth

## 2016-10-10 NOTE — Anesthesia Post-op Follow-up Note (Cosign Needed)
Anesthesia QCDR form completed.        

## 2016-10-10 NOTE — Interval H&P Note (Signed)
History and Physical Interval Note:  10/10/2016 9:54 AM  Madison BushmanJennifer L Holt  has presented today for surgery, with the diagnosis of AUB  The various methods of treatment have been discussed with the patient and family. After consideration of risks, benefits and other options for treatment, the patient has consented to  Procedure(s): HYSTERECTOMY VAGINAL (N/A) BILATERAL SALPINGECTOMY (Bilateral) as a surgical intervention .  The patient's history has been reviewed, patient examined, no change in status, stable for surgery.  I have reviewed the patient's chart and labs.  Questions were answered to the patient's satisfaction.     Christeen DouglasBethany Kahealani Yankovich

## 2016-10-10 NOTE — Progress Notes (Signed)
Discussed pain med management with hx of hallucinations with codeine in the past: recommend oxycodone.

## 2016-10-10 NOTE — Op Note (Signed)
Madison Holt PROCEDURE DATE: 10/10/2016  PREOPERATIVE DIAGNOSIS:   Menorrhagia  POSTOPERATIVE DIAGNOSIS:   Same SURGEON:   Christeen DouglasBethany Fonda Rochon, M.D. ASSISTANT: Leeroy Bockhelsea Ward M.D. OPERATION:  Total Vaginal Hysterectomy, bilateral salpingectomy ANESTHESIA:  General endotracheal. Anesthesiologist: Anesthesiologist: Darleene CleaverVan Staveren, Gerrit HeckGijsbertus F, MD CRNA: Irving BurtonBachich, Sonia, CRNA  INDICATIONS: The patient is a 36 y.o.  with history of heavy bleeding unresponsive to conservative management. The patient made a decision to undergo definite surgical treatment. On the preoperative visit, the risks, benefits, indications, and alternatives of the procedure were reviewed with the patient.  On the day of surgery, the risks of surgery were again discussed with the patient including but not limited to: bleeding which may require transfusion or reoperation; infection which may require antibiotics; injury to bowel, bladder, ureters or other surrounding organs; need for additional procedures; thromboembolic phenomenon, incisional problems and other postoperative/anesthesia complications. Written informed consent was obtained.    OPERATIVE FINDINGS: A 8 week size uterus with normal tubes and ovaries bilaterally.  ESTIMATED BLOOD LOSS: 100 ml FLUIDS:  1000 ml of Lactated Ringers URINE OUTPUT:  50 ml of clear yellow urine. SPECIMENS:  Uterus and cervix  sent to pathology COMPLICATIONS:  None immediate.  DESCRIPTION OF PROCEDURE:  The patient received prophylactic intravenous antibiotics and had sequential compression devices applied to her lower extremities while in the preoperative area.    She was taken to the operating room, where she was identified by name and birth date. General anesthesia was administered and was found to be adequate.  She was placed in the dorsal lithotomy position, and was prepped and draped in a sterile manner.  A formal time out procedure was performed with all team members present and in  agreement. A Foley catheter was inserted into her bladder and attached to gravity drainage. Attention was turned to her pelvis. Of note, all sutures used in this case were 0 Vicryl unless otherwise noted.     A weighted speculum was placed in the vagina, and the anterior and posterior lips of the cervix were grasped bilaterally with thyroid tenaculums.  The cervix was then injected circumferentially with 0.25% Marcaine with epinephrine solution to maintain hemostasis.  The cervix was circumferentially incised using electrocautery, and the posterior cul-de-sac was entered sharply in the midline.   A long weighted speculum was inserted into the posterior cul-de-sac. The bladder was dissected off the pubocervical fascia anteriorly with sharp and careful blunt dissection without complication.  The anterior cul-de-sac was then entered sharply without difficulty and the bladder retracted out of the operative field behind a retractor. The Heaney clamp was then used to clamp the uterosacral ligaments on either side.  They were then cut and sutured ligated with 0 Vicryl, and the ligated uterosacral ligaments were transfixed to the ipsilateral vaginal epithelium to further support the vagina and provide hemostasis. The cardinal ligaments were then clamped, cut and ligated. The uterine vessels and broad ligaments were then serially clamped with the Heaney clamps, cut, and suture ligated on both sides.  Excellent hemostasis was noted at this point.    The uterus was then delivered via the posterior cul-de-sac, and the cornua were clamped with the Heaney clamps, transected, and the uterine specimen was delivered and sent to pathology. These pedicles were then suture ligated to ensure hemostasis.   BILATERAL SALPINGECTOMY PARAGRAPH The Fallopian tubes were then identified and individually grasped with Babcock clamps. They were clamped across entirely with a Heaney clamp and free-tied, followed by suture ligation for  excellent  hemostasis bilaterally. The ovaries were left intact and in place.  After completion of the hysterectomy, all pedicles from the uterosacral ligament to the cornua were examined hemostasis was confirmed.  The peritoneum was closed in a purse string fashion with 2-0 PDS, taking care not to incorporate any intraabdominal organs in the closure.The vaginal cuff was then closed with in a running locked fashion with care given to incorporate the uterosacral pedicles bilaterally, which were also tied in the midline.  All instruments were then removed from the pelvis.  The patient tolerated the procedure well.  All instruments, needles, and sponge counts were correct x 2. The patient was taken to the recovery room in stable condition.    Cline Cools, MD, MPH

## 2016-10-10 NOTE — Anesthesia Preprocedure Evaluation (Signed)
Anesthesia Evaluation  Patient identified by MRN, date of birth, ID band Patient awake    Reviewed: Allergy & Precautions, NPO status , Patient's Chart, lab work & pertinent test results  Airway Mallampati: II       Dental  (+) Teeth Intact   Pulmonary shortness of breath and with exertion, former smoker,  Sarcoidosis    + decreased breath sounds      Cardiovascular Exercise Tolerance: Good  Rhythm:Regular     Neuro/Psych Anxiety negative neurological ROS     GI/Hepatic negative GI ROS, Neg liver ROS,   Endo/Other  negative endocrine ROS  Renal/GU negative Renal ROS     Musculoskeletal negative musculoskeletal ROS (+)   Abdominal Normal abdominal exam  (+)   Peds negative pediatric ROS (+)  Hematology negative hematology ROS (+)   Anesthesia Other Findings   Reproductive/Obstetrics negative OB ROS                             Anesthesia Physical Anesthesia Plan  ASA: II  Anesthesia Plan: General   Post-op Pain Management:    Induction: Intravenous  PONV Risk Score and Plan: Ondansetron and Dexamethasone  Airway Management Planned: Oral ETT  Additional Equipment:   Intra-op Plan:   Post-operative Plan: Extubation in OR  Informed Consent: I have reviewed the patients History and Physical, chart, labs and discussed the procedure including the risks, benefits and alternatives for the proposed anesthesia with the patient or authorized representative who has indicated his/her understanding and acceptance.     Plan Discussed with: CRNA  Anesthesia Plan Comments:         Anesthesia Quick Evaluation

## 2016-10-10 NOTE — Transfer of Care (Signed)
Immediate Anesthesia Transfer of Care Note  Patient: Ian BushmanJennifer L Taves  Procedure(s) Performed: Procedure(s): HYSTERECTOMY VAGINAL (N/A) BILATERAL SALPINGECTOMY (Bilateral)  Patient Location: PACU  Anesthesia Type:General  Level of Consciousness: sedated  Airway & Oxygen Therapy: Patient connected to face mask oxygen  Post-op Assessment: Post -op Vital signs reviewed and stable  Post vital signs: stable  Last Vitals:  Vitals:   10/10/16 0820 10/10/16 1203  BP: 126/85 99/63  Pulse: 88 77  Resp: 18 10  Temp: 36.9 C 37.1 C    Last Pain:  Vitals:   10/10/16 1203  TempSrc: Temporal         Complications: No apparent anesthesia complications

## 2016-10-10 NOTE — Anesthesia Procedure Notes (Signed)
Procedure Name: Intubation Date/Time: 10/10/2016 10:09 AM Performed by: Aline Brochure Pre-anesthesia Checklist: Patient identified, Emergency Drugs available, Suction available and Patient being monitored Patient Re-evaluated:Patient Re-evaluated prior to induction Oxygen Delivery Method: Circle system utilized Preoxygenation: Pre-oxygenation with 100% oxygen Induction Type: IV induction Ventilation: Mask ventilation without difficulty Laryngoscope Size: Mac and 3 Grade View: Grade I Tube type: Oral Tube size: 7.0 mm Number of attempts: 1 Airway Equipment and Method: Stylet Placement Confirmation: ETT inserted through vocal cords under direct vision,  positive ETCO2 and breath sounds checked- equal and bilateral Secured at: 21 cm Tube secured with: Tape Dental Injury: Teeth and Oropharynx as per pre-operative assessment

## 2016-10-11 DIAGNOSIS — N921 Excessive and frequent menstruation with irregular cycle: Secondary | ICD-10-CM | POA: Diagnosis not present

## 2016-10-11 LAB — BASIC METABOLIC PANEL
Anion gap: 5 (ref 5–15)
BUN: 14 mg/dL (ref 6–20)
CALCIUM: 8.8 mg/dL — AB (ref 8.9–10.3)
CHLORIDE: 108 mmol/L (ref 101–111)
CO2: 24 mmol/L (ref 22–32)
CREATININE: 1.06 mg/dL — AB (ref 0.44–1.00)
Glucose, Bld: 130 mg/dL — ABNORMAL HIGH (ref 65–99)
Potassium: 4.6 mmol/L (ref 3.5–5.1)
SODIUM: 137 mmol/L (ref 135–145)

## 2016-10-11 LAB — CBC
HCT: 35.6 % (ref 35.0–47.0)
HEMOGLOBIN: 12.3 g/dL (ref 12.0–16.0)
MCH: 30 pg (ref 26.0–34.0)
MCHC: 34.4 g/dL (ref 32.0–36.0)
MCV: 87.4 fL (ref 80.0–100.0)
Platelets: 203 10*3/uL (ref 150–440)
RBC: 4.08 MIL/uL (ref 3.80–5.20)
RDW: 12.9 % (ref 11.5–14.5)
WBC: 10.4 10*3/uL (ref 3.6–11.0)

## 2016-10-11 LAB — SURGICAL PATHOLOGY

## 2016-10-11 MED ORDER — OXYCODONE HCL 5 MG PO CAPS
5.0000 mg | ORAL_CAPSULE | Freq: Four times a day (QID) | ORAL | 0 refills | Status: DC | PRN
Start: 2016-10-11 — End: 2016-12-28

## 2016-10-11 MED ORDER — ACETAMINOPHEN 500 MG PO TABS: 1000.0000 mg | ORAL_TABLET | Freq: Four times a day (QID) | ORAL | 0 refills | Status: AC

## 2016-10-11 MED ORDER — DOCUSATE SODIUM 100 MG PO CAPS: 100.0000 mg | ORAL_CAPSULE | Freq: Two times a day (BID) | ORAL | 0 refills | Status: DC

## 2016-10-11 MED ORDER — GABAPENTIN 800 MG PO TABS: 800.0000 mg | ORAL_TABLET | Freq: Every day | ORAL | 0 refills | Status: DC

## 2016-10-11 MED ORDER — IBUPROFEN 800 MG PO TABS: 800.0000 mg | ORAL_TABLET | Freq: Three times a day (TID) | ORAL | 1 refills | Status: DC | PRN

## 2016-10-11 NOTE — Progress Notes (Signed)
D/C instructions provided, pt states understanding, aware of follow up appt.  Pt eating lunch prior to discharge.

## 2016-10-11 NOTE — Discharge Summary (Signed)
1 Day Post-Op       Procedure(s): HYSTERECTOMY VAGINAL (N/A) BILATERAL SALPINGECTOMY (Bilateral) Subjective: The patient is doing well.  No nausea or vomiting. Pain is adequately controlled.  Objective: Vital signs in last 24 hours: Temp:  [97.6 F (36.4 C)-98.7 F (37.1 C)] 98.2 F (36.8 C) (08/07 0832) Pulse Rate:  [66-94] 79 (08/07 0832) Resp:  [12-20] 20 (08/07 0832) BP: (95-116)/(57-91) 108/75 (08/07 0832) SpO2:  [96 %-100 %] 100 % (08/07 0832)  Intake/Output  Intake/Output Summary (Last 24 hours) at 10/11/16 0834 Last data filed at 10/11/16 0500  Gross per 24 hour  Intake             1600 ml  Output             2300 ml  Net             -700 ml    Physical Exam:  General: Alert and oriented. CV: RRR Lungs: Clear bilaterally. GI: Soft, Nondistended. Urine: Clear, adequate Extremities: Nontender, no erythema, no edema.  Lab Results:  Recent Labs  10/11/16 0427  HGB 12.3  HCT 35.6  WBC 10.4  PLT 203                 Results for orders placed or performed during the hospital encounter of 10/10/16 (from the past 24 hour(s))  CBC     Status: None   Collection Time: 10/11/16  4:27 AM  Result Value Ref Range   WBC 10.4 3.6 - 11.0 K/uL   RBC 4.08 3.80 - 5.20 MIL/uL   Hemoglobin 12.3 12.0 - 16.0 g/dL   HCT 16.135.6 09.635.0 - 04.547.0 %   MCV 87.4 80.0 - 100.0 fL   MCH 30.0 26.0 - 34.0 pg   MCHC 34.4 32.0 - 36.0 g/dL   RDW 40.912.9 81.111.5 - 91.414.5 %   Platelets 203 150 - 440 K/uL  Basic metabolic panel     Status: Abnormal   Collection Time: 10/11/16  4:27 AM  Result Value Ref Range   Sodium 137 135 - 145 mmol/L   Potassium 4.6 3.5 - 5.1 mmol/L   Chloride 108 101 - 111 mmol/L   CO2 24 22 - 32 mmol/L   Glucose, Bld 130 (H) 65 - 99 mg/dL   BUN 14 6 - 20 mg/dL   Creatinine, Ser 7.821.06 (H) 0.44 - 1.00 mg/dL   Calcium 8.8 (L) 8.9 - 10.3 mg/dL   GFR calc non Af Amer >60 >60 mL/min   GFR calc Af Amer >60 >60 mL/min   Anion gap 5 5 - 15    Assessment/Plan: 1 Day Post-Op    Procedure(s): HYSTERECTOMY VAGINAL (N/A) BILATERAL SALPINGECTOMY (Bilateral)  1) Ambulate, Incentive spirometry 2) Advance diet as tolerated 3) Transition to oral pain medication 5) Discharge home today anticipated    Christeen DouglasBethany Tamica Covell, MD   LOS: 0 days   Christeen DouglasBethany Nickayla Mcinnis 10/11/2016, 8:34 AM

## 2016-10-28 ENCOUNTER — Ambulatory Visit: Payer: Managed Care, Other (non HMO) | Admitting: Internal Medicine

## 2016-12-09 ENCOUNTER — Ambulatory Visit: Payer: Managed Care, Other (non HMO) | Admitting: Internal Medicine

## 2016-12-26 ENCOUNTER — Ambulatory Visit: Payer: Managed Care, Other (non HMO) | Admitting: Internal Medicine

## 2016-12-28 ENCOUNTER — Ambulatory Visit (INDEPENDENT_AMBULATORY_CARE_PROVIDER_SITE_OTHER): Payer: 59 | Admitting: Internal Medicine

## 2016-12-28 ENCOUNTER — Encounter: Payer: Self-pay | Admitting: Internal Medicine

## 2016-12-28 VITALS — BP 114/72 | HR 76 | Ht 63.0 in | Wt 141.0 lb

## 2016-12-28 DIAGNOSIS — D860 Sarcoidosis of lung: Secondary | ICD-10-CM

## 2016-12-28 NOTE — Progress Notes (Signed)
Subjective:     Madison Holt ID: Madison Holt, female   DOB: 09-17-1980, 36 y.o.   MRN: [id number]  HPI  36 yo Female former smoker seen for pulmonary consult 05/10/2005 by Dr. Marchelle Gearing for shortness of breath and cough.  TEST Pulmonary function tests of 05/04/2015 normal  Autoimmune workup 05/06/2015 extensive panel is essentially normal except angiotensin-converting enzyme is elevated at 60 and c-ANCA is trace positive at 1:40 but PR 3 and MPO antibodies are negative.  05/06/2015 Madison Holt also had CT chest with contrast and high resolution CT chest without contrast: There classic pulmonary infiltrates and hilar and subcarinal lymphadenopathy. Overall pattern is consistent with sarcoidosis radiologically according to the radiologist. I personally visualized this film.   Walking desat test 185 feet X 3 laps on RA -> did not desatrurate   06/29/2015 Follow up : Bx results Madison Holt returns for a follow-up. Madison Holt was seen last month for pulmonary consult for shortness of breath and cough and suspected sarcoid. Madison Holt had been having cough, shortness of breath. Pulmonary function test was normal. An autoimmune panel in March was essentially negative except for cANCA that was trace positive. Ace level was elevated (PR-3 and MPO negative). CT chest showed pulmonary infiltrates and hilar and subcarinal lymphadenopathy. Madison Holt was sent for a surgical consult. Madison Holt underwent a bronchoscopy, endobronchial ultrasound and mediastinoscopy on 06/05/15 . Surgical pathology came back for nonnecrotizing granuloma and hyaline fibrosis in the lymph nodes. Lung biopsy showed no granulomas or malignancy. Madison Holt was seen by [facility] care on April 7 for acute illness with chills and severe nausea. Madison Holt was started on prednisone 40 mg. Madison Holt says that her acute symptoms have resolved. Madison Holt has not seen any change in her breathing. Madison Holt continues to have fatigue, cough. Madison Holt denies any rash, chest pain, orthopnea, PND,  hemoptysis, orthopnea. Madison Holt says that Madison Holt has gained some weight since starting prednisone. We discussed healthy dietary choices and exercise. Madison Holt says that Madison Holt does have some visual changes. Over the last year. Recommend that Madison Holt make an appointment with ophthalmology for a annual exam and that Madison Holt will need annual eye exams yearly. Madison Holt is currently on prednisone 40 mg daily   REC pred and taper and hold at 10mg  per day  OV 08/10/2015  Chief Complaint  Madison Holt presents with  . Follow-up    Madison Holt c/o occasional dry cough and SOB/chest tightness with exertion. Madison Holt denies wheeze/CP. Madison Holt states that her breathing is improving. Madison Holt has noticed that weather does affect her breathing, mostly with extremes in air temperature and humidity.      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 At that point in time Madison Holt already been on a few weeks of prednisone at 40 mg per day. At that visit 06/29/2015 Madison Holt was better. Madison Holt was asked to taper her prednisone and continue at 10 mg per day. However Madison Holt ran out of the bottle and Madison Holt stopped taking the prednisone one week ago. However it turns out that Madison Holt is reluctant to take prednisone because of weight gain. Madison Holt says Madison Holt has gained 30 pounds of weight which is more than her pregnancy weight gain in the past. Madison Holt does admit that prednisone helped her a lot. While shortness of breath cough chest tightness was all improved with prednisone significantly but now off prednisone for weeks symptoms are returning. The heat and humidity do bother her more significantly. Per hx - eeye exam - no sarcoid  Wondering about measures Madison Holt  could' do to coutneract weight gain   OV 11/16/2015  Chief Complaint  Madison Holt presents with  . Follow-up    been off steriods for x 1 month and more sob faster, dry cough, chest pain and some tightness   Follow-up stage II pulmonary sarcoidosis-diagnosis established March  2017  Last visit June 2017. We reinitiated prednisone. However Madison Holt went to the beach and forgot to take prednisone. Madison Holt has not taken prednisone in over a month. With this Madison Holt says that her mood swings are better. Madison Holt still has some sleep issues. Being off prednisone initially Madison Holt was fine but now Madison Holt feels the shortness of breath and cough are coming back. Madison Holt struggling between managing the side effects of prednisone and the symptoms of sarcoidosis. Last chest x-ray 06/05/2015 at the time diagnosis was made. Last pulmonary function test was in February 2017 for diagnosis was made. 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 Madison Holt no longer works in a Air[employer].     has a past medical history of Pneumonia (2014) and Shortness of breath dyspnea.   reports that Madison Holt quit smoking about 17 years ago. Her smoking use included Cigarettes. Madison Holt has a 5.00 pack-year smoking history. Madison Holt has never used smokeless tobacco.   OV 01/01/2016  Chief Complaint  Madison Holt presents with  . Follow-up    Madison Holt states Madison Holt is taking Methotrexate and has not noticed a difference. Madison Holt states the methotrexate makes her have depression, anxious, nausea, fatigued. Madison Holt c/o dry cough - no change, chest pain - no change from last OV.    Follow-up stage II pulmonary sarcoidosis normal bony function tests spring 2017 at the time of diagnosis but significant respirator symptoms  Last visit 11/16/2015. At that visit Madison Holt is reporting significant side effects especially mood swings with prednisone even though it was helping her symptoms. We opted for methotrexate 7.5 mg once weekly. Madison Holt now presents for follow-up. Madison Holt tells me that the methotrexate is ineffective with respirator symptoms of cough and shortness of breath. In addition Madison Holt is taking it compliantly but is having some GI side effects of nausea and vomiting and also mood swings. Madison Holt talk to mother was on the same medication  for rheumatoid arthritis and apparently Madison Holt has similar side effects. Madison Holt feels very strongly the methotrexate as the cause of the side effects. Madison Holt feels extremely frustrated because of the lack of control of a sarcoid symptoms and side effects from therapy. Madison Holt is up-to-date with her flu shot. Madison Holt is looking for other options. Madison Holt says Madison Holt is gained 30 pounds or so and is now sedentary and is upset this is not her personality. Madison Holt is open to attending pulmonary rehabilitation. Mentioned Madison Holt is reporting significant fatigue by end of the day. Madison Holt is wondering about her thyroid, vitamin D levels. Off note Madison Holt is on Advair and this is causing significant tremors  Most recent lab work 11/20/2015 of CBC chemistry liver function tests all normal    Pulmonary function tests of 05/04/2015 normal  Autoimmune workup 05/06/2015 extensive panel is essentially normal except angiotensin-converting enzyme is elevated at 60 and c-ANCA is trace positive at 1:40 but PR 3 and MPO antibodies are negative.  03/29/16 Follow up : Sarcoid  Madison Holt returns for 3 month follow up for Sarcoid . Madison Holt remains on Methotrexate.  Madison Holt is also on QVAR Twice daily  . No flare of cough or dyspnea.  Madison Holt is due  for labs today .  Madison Holt has noticed spot along inner thighs/perineal area that is hyperpigmented. No other skin rash.  We discussed going to her dermatologist for evaluation .  Madison Holt is going to pulmonary rehab.  Madison Holt does get occasional chest tightness that has been going on for months. Madison Holt denies syncope or palpitations. 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    OV 07/12/2016  Chief Complaint  Madison Holt presents with  . Follow-up    FOLLOW UP FOR  Madison Holt was in the hospital on June 06 2016 for the flu, Madison Holt was released on the 4th of April. Madison Holt has stopped taking all of her medications    Follow-up stage II pulmonary sarcoidosis started methotrexate September 2017 and dose reduced October  2017.  In the interim January 2018 Madison Holt saw nurse practitioner was advised to continue methotrexate. Then in April 2018 Madison Holt was admitted to [facility] with acute onset of influenza A. Review of the labs show that Madison Holt had neutropenia at this time which quickly recovered. Her kidney function was normal. Madison Holt says that is the first time Madison Holt's had influenza. Madison Holt is completely upset about how sick Madison Holt got and horrible Madison Holt fell. After that Madison Holt stopped taking her prednisone and methotrexate completely. At this point in time Madison Holt only has mild shortness of breath with exertion of class 2 or1 medicines. This is the same dyspnea free onset of influenza. Madison Holt also has some exertional fatigue which is present post influenza and is only slowly improving. At this point in time Madison Holt feels that the symptoms are better than handling prednisone and methotrexate. Madison Holt does not want to go on these medications. Otherwise feeling well without any cough or wheezing or weight loss   IMPRESSION: Chronic reticulonodular interstitial prominence bilaterally consistent with sarcoidosis again noted. Stable linear scarring in right upper lobe. No definite superimposed infiltrate or pulmonary edema   Elect  ronically Signed   By: Natasha Mead M.D.   On: 06/06/2016 08:36  OV 12/28/2016  Chief Complaint  Madison Holt presents with  . Follow-up    Madison Holt is having increased fatigue and more SOB faster now in last two months. Madison Holt had surgery in August 2018.    Follow-up stage 2 sarcoidosis: Observation therapy of having side effects of methotrexate and prednisone. Madison Holt returns for follow-up last seen in spring 2018 since then overall stable without any cough or wheezing weight loss. Madison Holt continues to have a mild night sweats. In August 2018  Madison Holt underwent hysterectomy for dysfunctional uterine bleeding. Since then Madison Holt's feeling more fatigued but Madison Holt thinks is due to the hysterectomy itself. Madison Holt had questions about indications for flu  shot and Pneumovax. I shared  the literature with her  has a past medical history of Pneumonia (2014); Sarcoidosis; and Shortness of breath dyspnea.   reports that Madison Holt quit smoking about 18 years ago. Her smoking use included Cigarettes. Madison Holt has a 5.00 pack-year smoking history. Madison Holt has never used smokeless tobacco.  Past Surgical History:  Procedure Laterality Date  . BILATERAL SALPINGECTOMY Bilateral 10/10/2016   Procedure: BILATERAL SALPINGECTOMY;  Surgeon: Christeen Douglas, MD;  Location: [facility] ORS;  Service: Gynecology;  Laterality: Bilateral;  . ESOPHAGOGASTRODUODENOSCOPY    . MEDIASTINOSCOPY N/A 06/05/2015   Procedure:  MEDIASTINOSCOPY;  Surgeon: Loreli Slot, MD;  Location: [facility] OR;  Service: Thoracic;  Laterality: N/A;  . VAGINAL HYSTERECTOMY N/A 10/10/2016   Procedure: HYSTERECTOMY VAGINAL;  Surgeon: Christeen Douglas, MD;  Location: [facility]  ORS;  Service: Gynecology;  Laterality: N/A;  . VIDEO BRONCHOSCOPY WITH ENDOBRONCHIAL NAVIGATION N/A 06/05/2015   Procedure: VIDEO BRONCHOSCOPY WITH ENDOBRONCHIAL NAVIGATION;  Surgeon: Loreli SlotSteven C Hendrickson, MD;  [facility];  Service: Thoracic;  Laterality: N/A;  . VIDEO BRONCHOSCOPY WITH ENDOBRONCHIAL ULTRASOUND N/A 06/05/2015   Procedure: VIDEO BRONCHOSCOPY WITH ENDOBRONCHIAL ULTRASOUND;  Surgeon: Loreli SlotSteven C Hendrickson, MD;  Location: [facility] OR;  Service: Thoracic;  Laterality: N/A;    Allergies  Allergen Reactions  . Codeine Other (See Comments)    Other reaction(s): Hallucination    Immunization History  Administered Date(s) Administered  . Influenza,inj,Quad PF,6+ Mos 12/21/2015    Family History  Problem Relation Age of Onset  . Arthritis Mother   . Hyperlipidemia Mother   . Hyperlipidemia Father   . Diabetes Father   . Breast cancer Maternal Grandmother   . Hypertension Maternal Grandmother   . Colon cancer Maternal Grandfather   . Hypertension Maternal Grandfather   . Heart disease Paternal Grandfather      Current  Outpatient Prescriptions:  .  acetaminophen (TYLENOL) 325 MG tablet, Take 650 mg by mouth every 6 (six) hours as needed for mild pain or headache. , Disp: , Rfl:    Review of Systems     Objective:   Physical Exam  Constitutional: Madison Holt is oriented to person, place, and time. Madison Holt appears well-developed and well-nourished. No distress.  HENT:  Head: Normocephalic and atraumatic.  Right Ear: External ear normal.  Left Ear: External ear normal.  Mouth/Throat: Oropharynx is clear and moist. No oropharyngeal exudate.  Eyes: Pupils are equal, round, and reactive to light. Conjunctivae and EOM are normal. Right eye exhibits no discharge. Left eye exhibits no discharge. No scleral icterus.  Neck: Normal range of motion. Neck supple. No JVD present. No tracheal deviation present. No thyromegaly present.  Cardiovascular: Normal rate, regular rhythm, normal heart sounds and intact distal pulses.  Exam reveals no gallop and no friction rub.   No murmur heard. Pulmonary/Chest: Effort normal and breath sounds normal. No respiratory distress. Madison Holt has no wheezes. Madison Holt has no rales. Madison Holt exhibits no tenderness.  Abdominal: Soft. Bowel sounds are normal. Madison Holt exhibits no distension and no mass. There is no tenderness. There is no rebound and no guarding.  Musculoskeletal: Normal range of motion. Madison Holt exhibits no edema or tenderness.  Lymphadenopathy:    Madison Holt has no cervical adenopathy.  Neurological: Madison Holt is alert and oriented to person, place, and time. Madison Holt has normal reflexes. No cranial nerve deficit. Madison Holt exhibits normal muscle tone. Coordination normal.  Skin: Skin is warm and dry. No rash noted. Madison Holt is not diaphoretic. No erythema. No pallor.  Psychiatric: Madison Holt has a normal mood and affect. Her behavior is normal. Judgment and thought content normal.  Vitals reviewed.  Vitals:   12/28/16 1637  BP: 114/72  Pulse: 76  SpO2: 100%  Weight: 141 lb (64 kg)  Height: 5\' 3"  (1.6 m)    Estimated body mass  index is 24.98 kg/m as calculated from the following:   Height as of this encounter: 5\' 3"  (1.6 m).   Weight as of this encounter: 141 lb (64 kg).     Assessment:       ICD-10-CM   1. Pulmonary sarcoidosis (HCC) D86.0        Plan:      Clinically stable Fatigue likely due to august 2018 hysterectomy  Plan - obersvation therapy  - return in 6 months or sooner   Dr. Kalman ShanMurali Rhiley Tarver,  M.D., F.C.C.P Pulmonary and Critical Care Medicine Staff Physician [facility] Pager: [phone], If no answer or between  15:00h - 7:00h: call [phone]  12/28/2016 5:26 PM

## 2016-12-28 NOTE — Patient Instructions (Signed)
ICD-10-CM   1. Pulmonary sarcoidosis (HCC) D86.0    Clinically stable Fatigue likely due to august 2018 hysterectomy  Plan - obersvation therapy  - return in 6 months or sooner

## 2017-01-09 IMAGING — CT CT CHEST HIGH RESOLUTION W/O CM
2 of 5 series · 15 of 36 positions shown, 18 images · non-contrast
Comparison: No priors.

CLINICAL DATA: 34-year-old female with chronic shortness of breath
since hospitalization for pneumonia in February 2013. Intermittent
fevers, chills and nausea and vomiting approximately 1 week out of
every month since December 2014.

EXAM:
CT CHEST WITHOUT CONTRAST
TECHNIQUE: Multidetector CT imaging of the chest was performed following the
standard protocol without intravenous contrast. High resolution
imaging of the lungs, as well as inspiratory and expiratory imaging,
was performed.

[Series 5: high resolution · axial · 0.62mm/px · z∈[-91,+119]mm · 12 of 48 slices shown, 15 images]
[im 3/48  mediastinal]
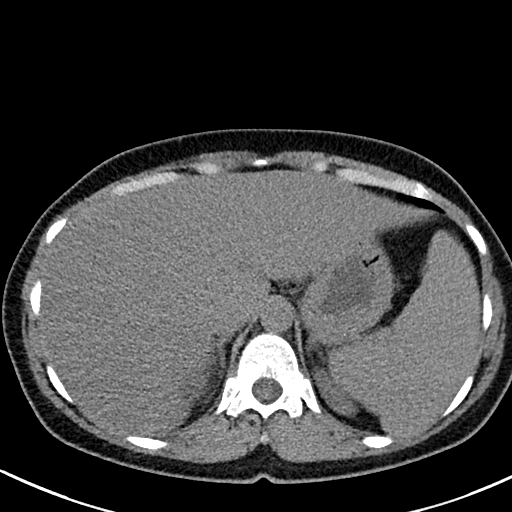
[im 3/48  lung]
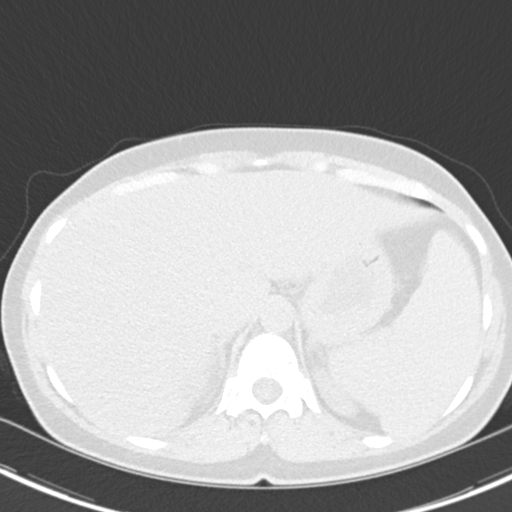
[im 9/48  lung]
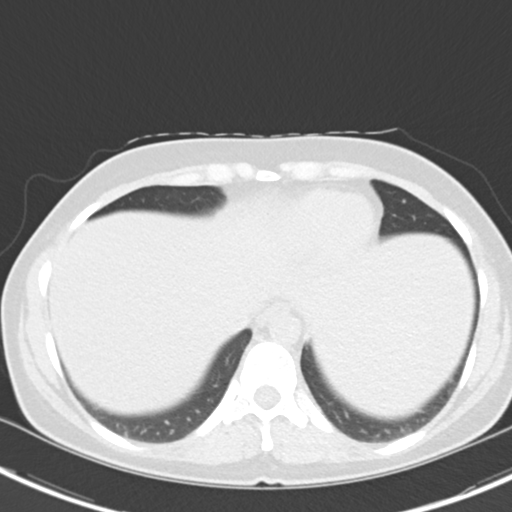
[im 12/48  lung]
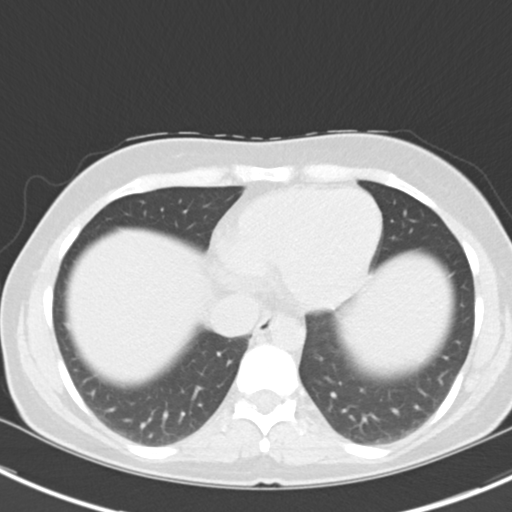
[im 14/48  lung]
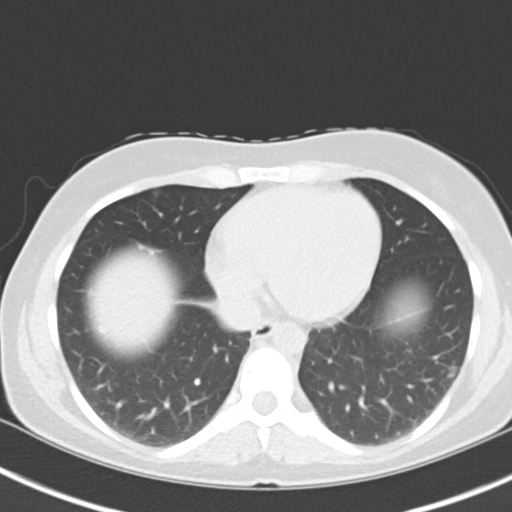
[im 20/48  mediastinal]
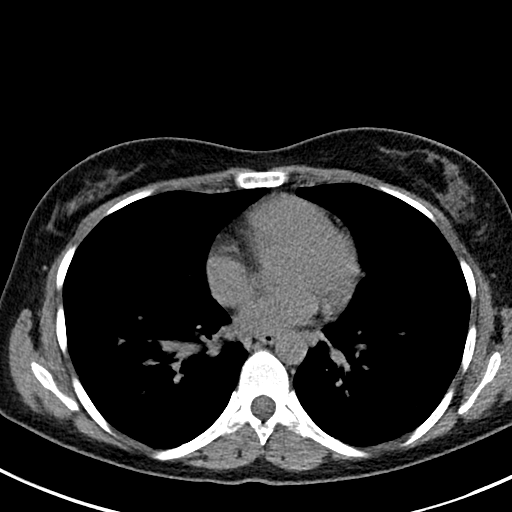
[im 20/48  lung]
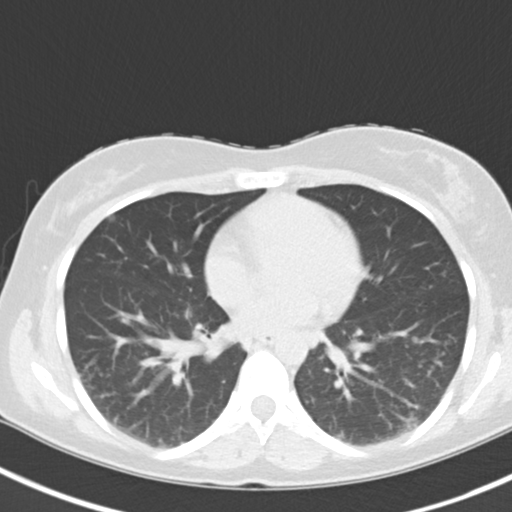
[im 23/48  lung]
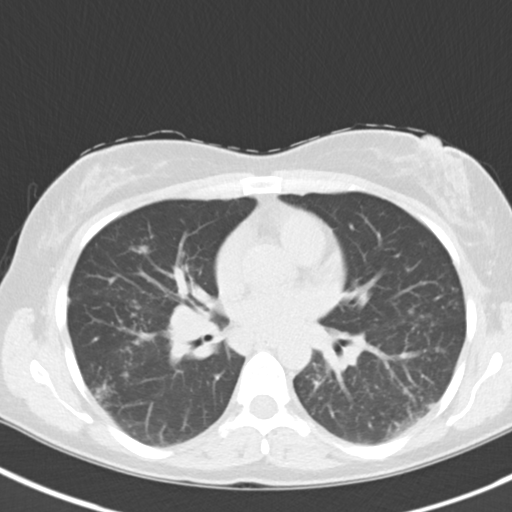
[im 25/48  lung]
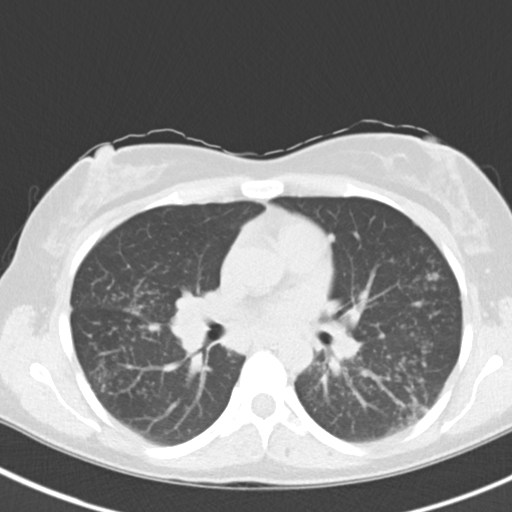
[im 31/48  lung]
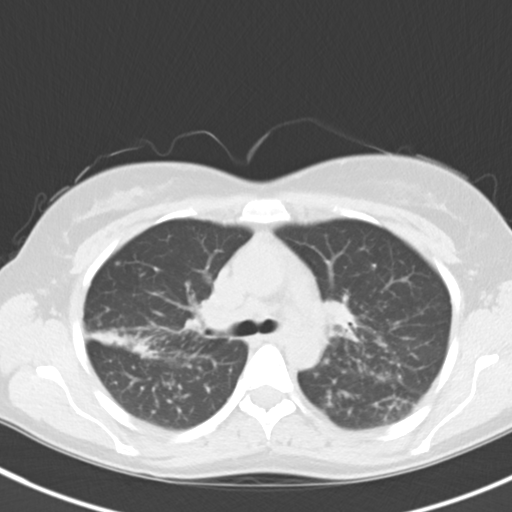
[im 34/48  mediastinal]
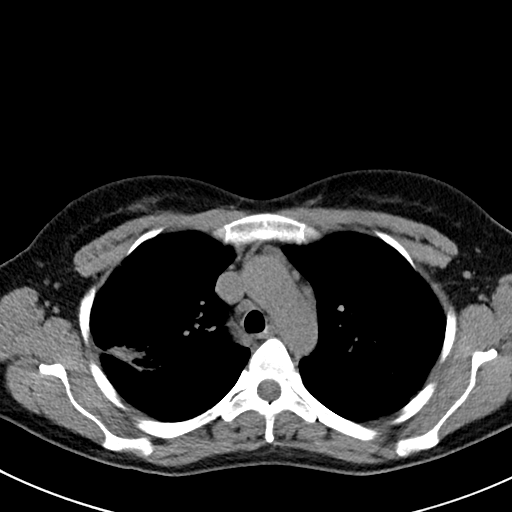
[im 34/48  lung]
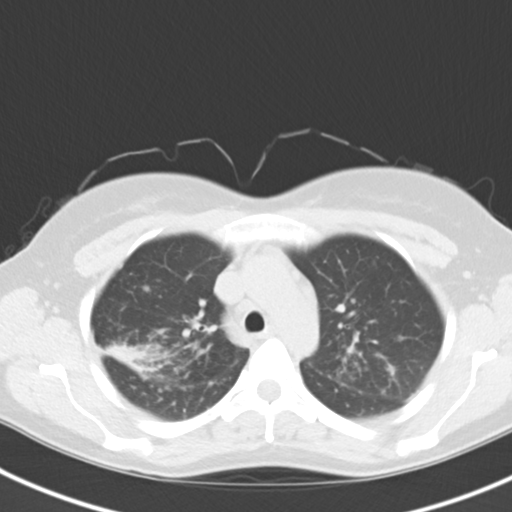
[im 36/48  lung]
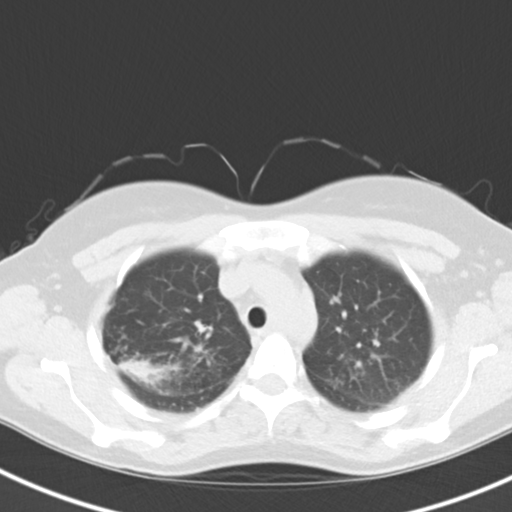
[im 42/48  lung]
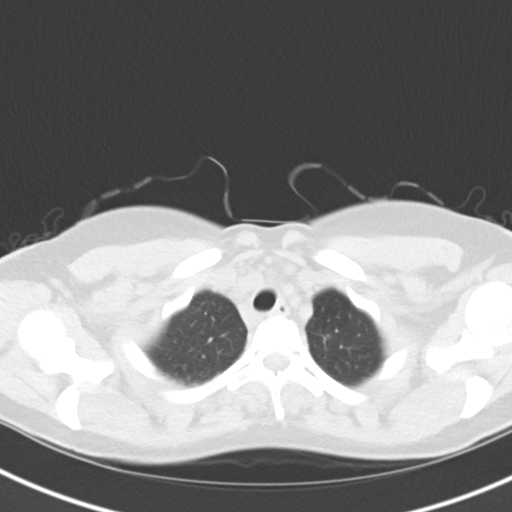
[im 45/48  lung]
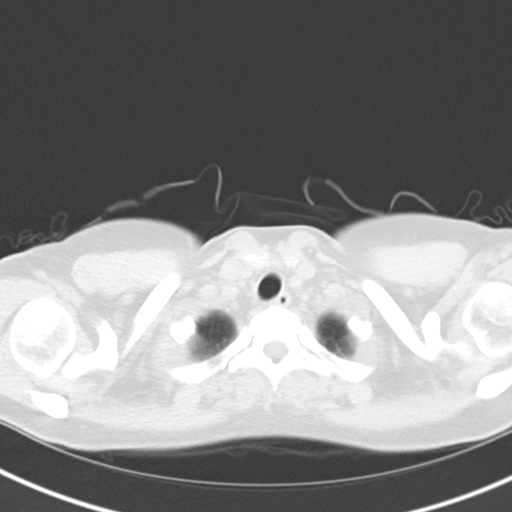

[Series 9: coronal · coronal · 0.59mm/px · 3 of 107 slices shown]
[im 22/107  lung]
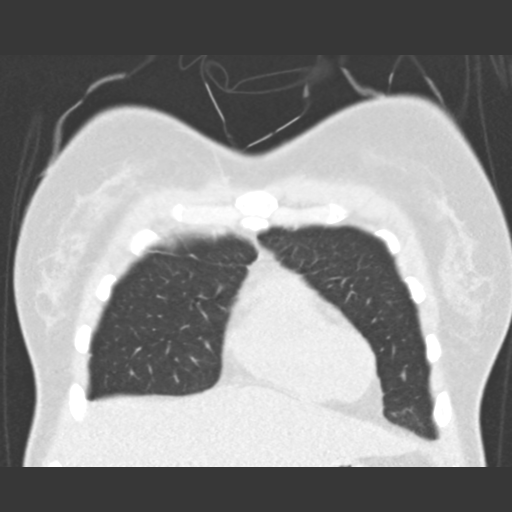
[im 43/107  lung]
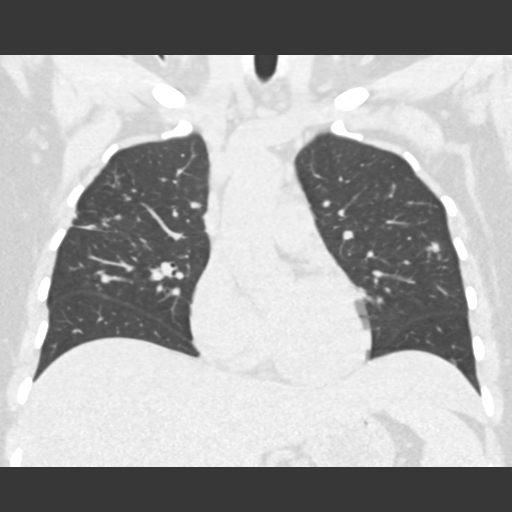
[im 64/107  lung]
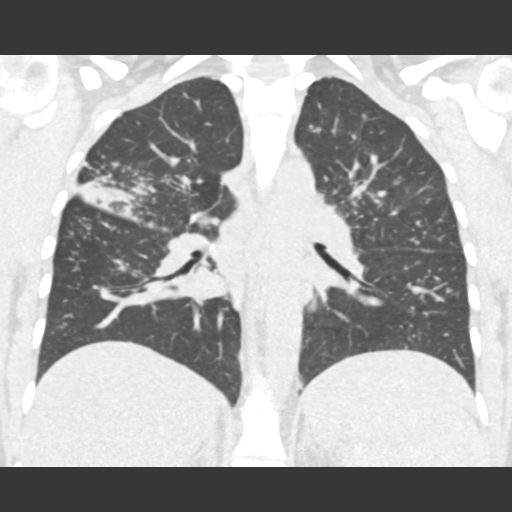

[15 of 36 positions shown; findings below may reference images not displayed]

FINDINGS: Mediastinum/Lymph Nodes: Heart size is normal. There is no
significant pericardial fluid, thickening or pericardial
calcification. Multiple borderline enlarged and mildly enlarged
mediastinal and bilateral hilar lymph nodes measuring up to 1 cm in
short axis in the subcarinal nodal station. Esophagus is
unremarkable in appearance. No axillary lymphadenopathy.

Lungs/Pleura: There is extensive micro and macronodularity
throughout the lungs bilaterally. Nodules are distributed
predominantly throughout the mid to upper lungs. The nodule
distribution appears to be peribronchovascular and subpleural (i.e.,
overall distribution is perilymphatic). There are several areas with
conglomeration dense of nodules forming mass-like opacities, most
evident in the posterior aspect of the right upper lobe abutting the
major fissure. These findings are associated with some upward
retraction of hilar structures. No pleural effusions.
High-resolution images demonstrate no significant regions of
ground-glass attenuation, parenchymal banding or frank honeycombing.
Inspiratory and expiratory imaging demonstrates minimal air
trapping.

Upper abdomen: Unremarkable.

Musculoskeletal: There are no aggressive appearing lytic or blastic
lesions noted in the visualized portions of the skeleton.
IMPRESSION: 1. Imaging findings on today's examination are strongly suggestive
of sarcoidosis, as discussed above. Clinical correlation is
suggested.
2. No finding to suggest interstitial lung disease.

## 2017-02-08 IMAGING — CR DG CHEST 2V
2 series · 2 of 2 positions shown · non-contrast
Comparison: CT chest 05/06/2015.

CLINICAL DATA: Mediastinal lymphadenopathy. Lung infiltrate.
Preoperative exam.

EXAM:
CHEST  2 VIEW

[w chest pa]
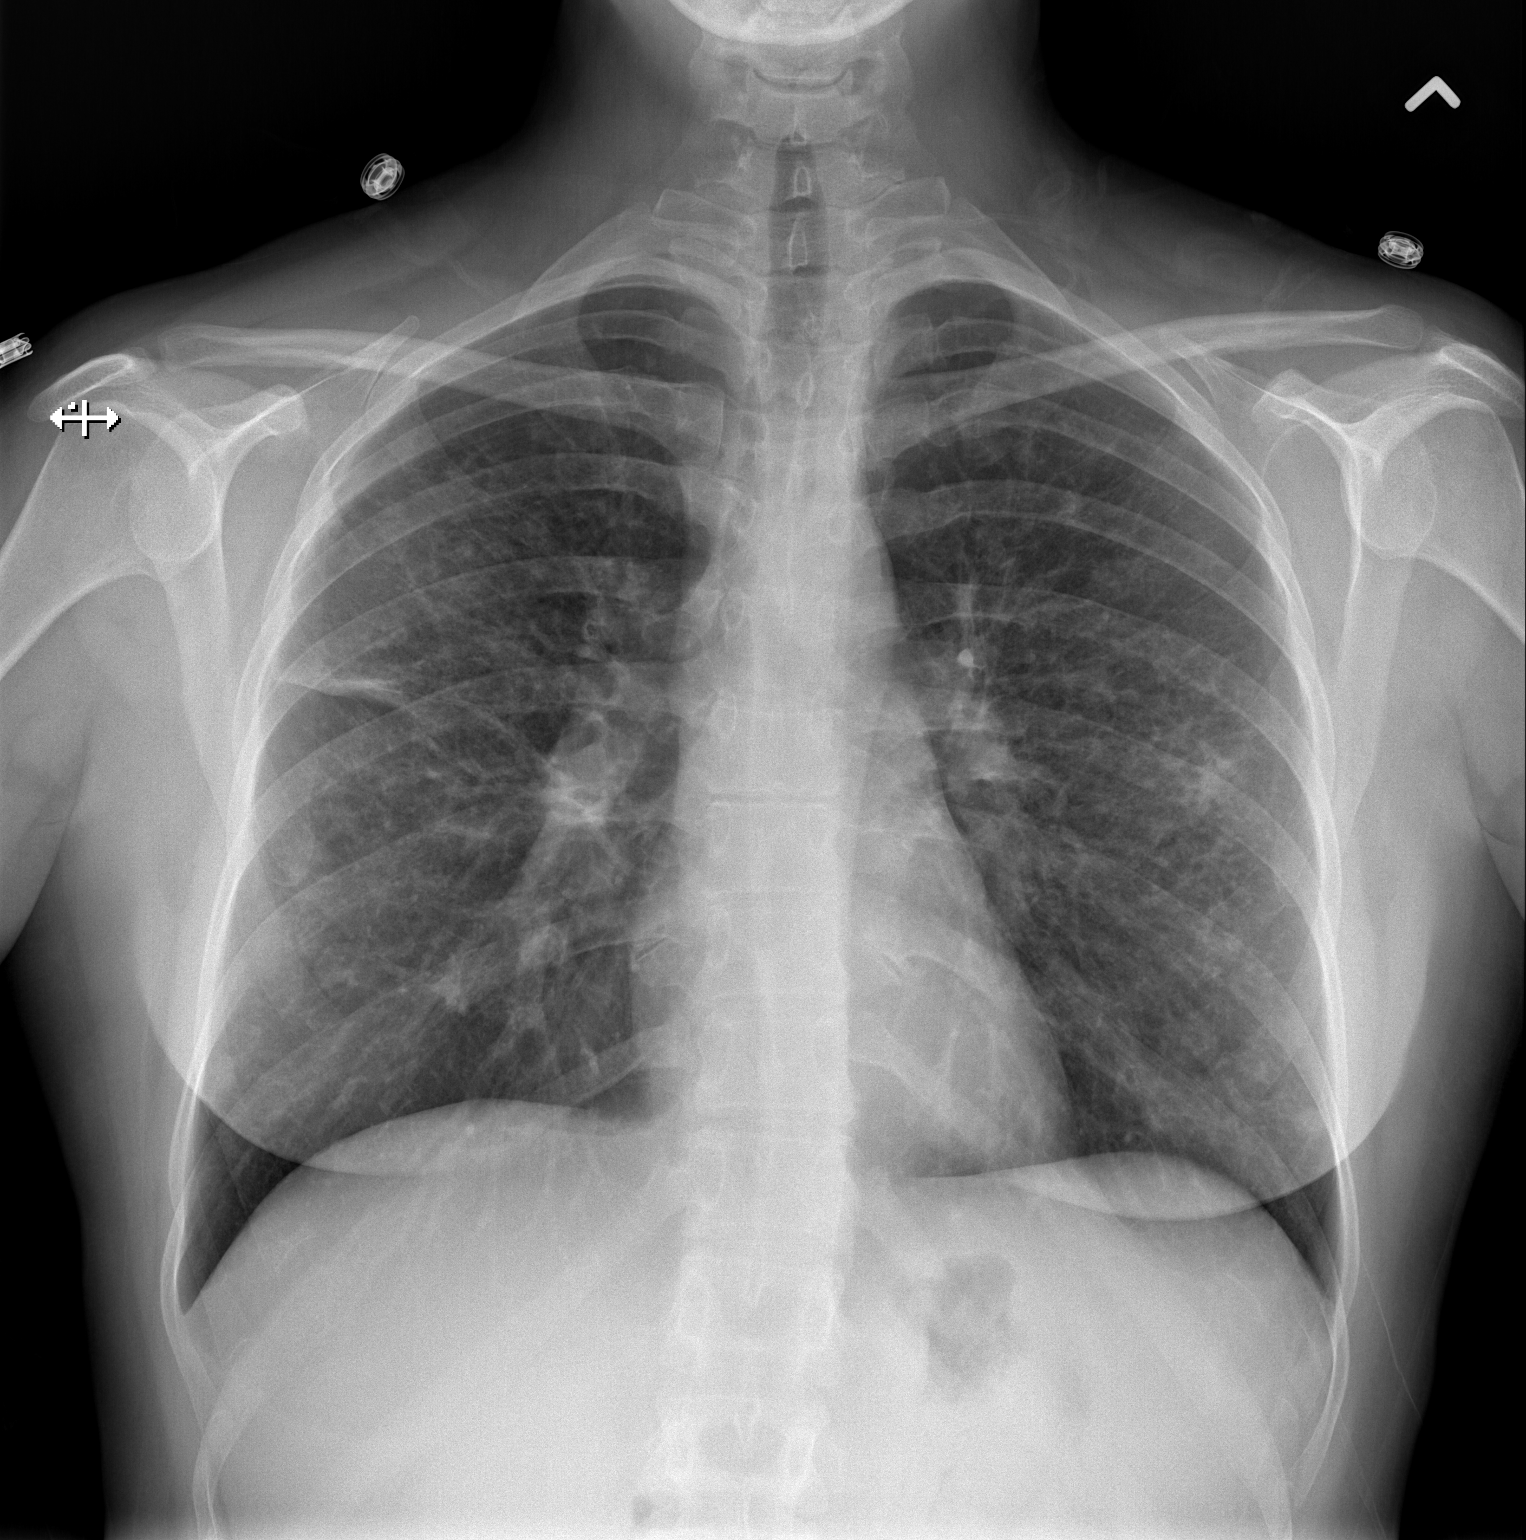

[w chest lat]
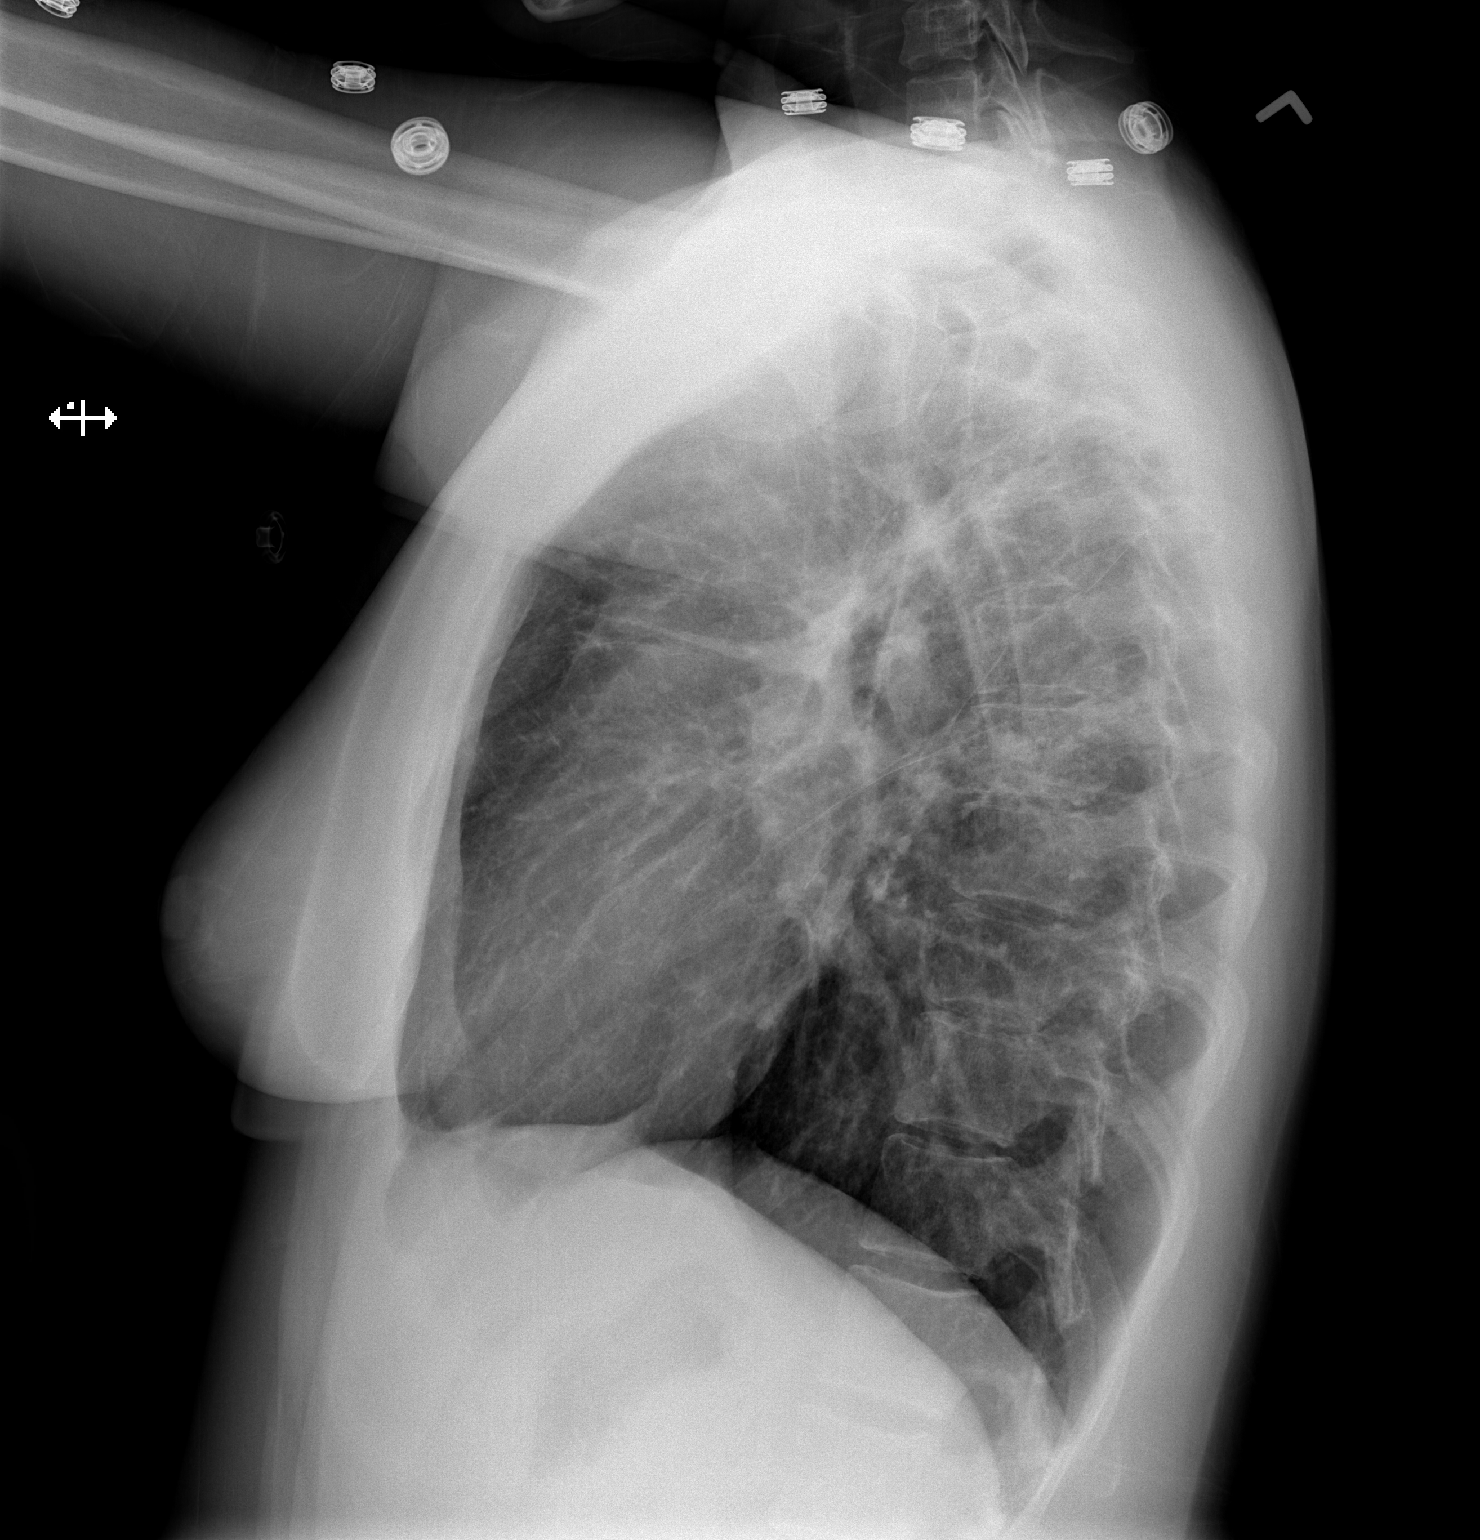

[2 of 2 positions shown; findings below may reference images not displayed]

FINDINGS: Extensive micronodularity is seen in both lungs. Heart size is
normal. Hilar and mediastinal lymphadenopathy is noted but better
demonstrated on the prior CT. There is no pneumothorax or pleural
effusion.
IMPRESSION: No change in mediastinal and hilar lymphadenopathy and
micronodularity throughout the chest most compatible sarcoidosis.

## 2017-05-16 ENCOUNTER — Encounter: Payer: Self-pay | Admitting: Family Medicine

## 2017-05-16 DIAGNOSIS — G8918 Other acute postprocedural pain: Secondary | ICD-10-CM

## 2017-05-16 MED ORDER — TRAMADOL HCL 50 MG PO TABS: 50.0000 mg | ORAL_TABLET | Freq: Four times a day (QID) | ORAL | 1 refills | Status: DC | PRN

## 2017-05-16 NOTE — Telephone Encounter (Signed)
I sent script by accident to [facility] [place] - can you let them know an error?  [facility].  Thanks!

## 2017-05-16 NOTE — Telephone Encounter (Signed)
[facility] [place] [facility] [place] Tram[place] prescription sent in by Dr. Patsy Lageropland.

## 2017-06-15 ENCOUNTER — Telehealth: Payer: Self-pay | Admitting: Internal Medicine

## 2017-06-15 NOTE — Telephone Encounter (Signed)
Called and spoke to patient. Patient stated she is nervous about travel. Patient asked about going to the mountains to visit the [place]. Explained to patient that changes in elevation could possibly cause increase in symptoms such as shortness of breath and increased fatigue but there could be more that I am not aware of. Offered to send the message back to the MD to ask for further information. Patient declined and stated she is going on the trip tomorrow. Nothing further is needed at this time.

## 2017-06-26 ENCOUNTER — Encounter: Payer: Self-pay | Admitting: Internal Medicine

## 2017-06-26 ENCOUNTER — Ambulatory Visit: Payer: 59 | Admitting: Internal Medicine

## 2017-06-26 VITALS — BP 108/78 | HR 87 | Ht 63.0 in | Wt 144.0 lb

## 2017-06-26 DIAGNOSIS — R0602 Shortness of breath: Secondary | ICD-10-CM | POA: Diagnosis not present

## 2017-06-26 DIAGNOSIS — D860 Sarcoidosis of lung: Secondary | ICD-10-CM

## 2017-06-26 NOTE — Patient Instructions (Signed)
ICD-10-CM   1. Shortness of breath R06.02   2. Pulmonary sarcoidosis (HCC) D86.0     Try inspiratory muscle training - 30 times a day - gave you examples of device  - not sure if will work but worth trying Do CPST bike test with EIB challenge  [place]ol[place] Return to see me after CPST bike test - next few to several weeks

## 2017-06-26 NOTE — Progress Notes (Signed)
Subjective:     Patient ID: Madison Holt, female   DOB: Jan 04, 1981, 37 y.o.   MRN: [id number]  HPI  37 yo Female former smoker seen for pulmonary consult 05/10/2005 by Dr. Marchelle Gearingamaswamy for shortness of breath and cough.  TEST Pulmonary function tests of 05/04/2015 normal  Autoimmune workup 05/06/2015 extensive panel is essentially normal except angiotensin-converting enzyme is elevated at 60 and c-ANCA is trace positive at 1:40 but PR 3 and MPO antibodies are negative.  05/06/2015 she also had CT chest with contrast and high resolution CT chest without contrast: There classic pulmonary infiltrates and hilar and subcarinal lymphadenopathy. Overall pattern is consistent with sarcoidosis radiologically according to the radiologist. I personally visualized this film.   Walking desat test 185 feet X 3 laps on RA -> did not desatrurate   06/29/2015 Follow up : Bx results Patient returns for a follow-up. Patient was seen last month for pulmonary consult for shortness of breath and cough and suspected sarcoid. Patient had been having cough, shortness of breath. Pulmonary function test was normal. An autoimmune panel in March was essentially negative except for cANCA that was trace positive. Ace level was elevated (PR-3 and MPO negative). CT chest showed pulmonary infiltrates and hilar and subcarinal lymphadenopathy. Patient was sent for a surgical consult. Patient underwent a bronchoscopy, endobronchial ultrasound and mediastinoscopy on 06/05/15 . Surgical pathology came back for nonnecrotizing granuloma and hyaline fibrosis in the lymph nodes. Lung biopsy showed no granulomas or malignancy. [facility] for acute illness with chills and severe nausea. She was started on prednisone 40 mg. She says that her acute symptoms have resolved. She has not seen any change in her breathing. She continues to have fatigue, cough. She denies any rash, chest pain, orthopnea, PND,  hemoptysis, orthopnea. She says that she has gained some weight since starting prednisone. We discussed healthy dietary choices and exercise. She says that she does have some visual changes. Over the last year. Recommend that she make an appointment with ophthalmology for a annual exam and that she will need annual eye exams yearly. She is currently on prednisone 40 mg daily   REC pred and taper and hold at 10mg  per day  OV 08/10/2015  Chief Complaint  Patient presents with  . Follow-up    Pt c/o occasional dry cough and SOB/chest tightness with exertion. Pt denies wheeze/CP. Pt states that her breathing is improving. She has noticed that weather does affect her breathing, mostly with extremes in air temperature and humidity.      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 At that point in time she already been on a few weeks of prednisone at 40 mg per day. At that visit 06/29/2015 she was better. She was asked to taper her prednisone and continue at 10 mg per day. However she ran out of the bottle and she stopped taking the prednisone one week ago. However it turns out that she is reluctant to take prednisone because of weight gain. She says she has gained 30 pounds of weight which is more than her pregnancy weight gain in the past. She does admit that prednisone helped her a lot. While shortness of breath cough chest tightness was all improved with prednisone significantly but now off prednisone for weeks symptoms are returning. The heat and humidity do bother her more significantly. Per hx - eeye exam - no sarcoid  Wondering about measures she  could' do to coutneract weight gain   OV 11/16/2015  Chief Complaint  Patient presents with  . Follow-up    been off steriods for x 1 month and more sob faster, dry cough, chest pain and some tightness   Follow-up stage II pulmonary sarcoidosis-diagnosis established March  2017  Last visit June 2017. We reinitiated prednisone. However she went to the beach and forgot to take prednisone. She has not taken prednisone in over a month. With this she says that her mood swings are better. She still has some sleep issues. Being off prednisone initially she was fine but now she feels the shortness of breath and cough are coming back. She struggling between managing the side effects of prednisone and the symptoms of sarcoidosis. Last chest x-ray 06/05/2015 at the time diagnosis was made. Last pulmonary function test was in February 2017 for diagnosis was made. 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   OV 06/26/2017 Chief Complaint  Patient presents with  . Follow-up    reports some SOB since weather has changed, overall states she has been okay.    37 year old female with stage II sarcoidosis on observation therapy  Presents for routine follow-up.  It has been over a year since I last saw her.  Overall in the past year she has been stable.  She continues to work Psychologist, sport and exercise at medical clinic.  She continues to feel shortness of breath on talking.  She feels like she cannot to get a good deep breath.  Long-acting beta agonist made her tachycardic.  Prednisone made her gain weight and since being off prednisone she has not lost that weight.  Prednisone also made her mood changes and these have improved/resolved after stopping prednisone.  She gets easily dyspneic on minimal exertion.  She says she is to be an athlete but is unable to do these activities.  She just feels like she needs to get a good deep breath.  She is open to the idea of having pulmonary stress test.  A cardiac stress test a few years ago apparently was normal.  She does not have any other clinical symptoms of sarcoidosis and she feels a sarcoidosis under control.  She is not keen on repeating a chest x-ray or having  pulmonary function test.  Socially she has been busy getting her son into college and a son in high school and now the son 80 years old      has a past medical history of Pneumonia (2014), Sarcoidosis, and Shortness of breath dyspnea.   reports that she quit smoking about 19 years ago. Her smoking use included cigarettes. She has a 5.00 pack-year smoking history. She has never used smokeless tobacco.  Past Surgical History:  Procedure Laterality Date  . BILATERAL SALPINGECTOMY Bilateral 10/10/2016   Procedure: BILATERAL SALPINGECTOMY;  Surgeon: Christeen Douglas, MD;  Location: [facility] ORS;  Service: Gynecology;  Laterality: Bilateral;  . ESOPHAGOGASTRODUODENOSCOPY    . MEDIASTINOSCOPY N/A 06/05/2015   Procedure:  MEDIASTINOSCOPY;  Surgeon: Loreli Slot, MD;  Location: [facility] OR;  Service: Thoracic;  Laterality: N/A;  . VAGINAL HYSTERECTOMY N/A 10/10/2016   Procedure: HYSTERECTOMY VAGINAL;  Surgeon: Christeen Douglas, MD;  Location: [facility] ORS;  Service: Gynecology;  Laterality: N/A;  . VIDEO BRONCHOSCOPY WITH ENDOBRONCHIAL NAVIGATION N/A 06/05/2015   Procedure: VIDEO BRONCHOSCOPY WITH ENDOBRONCHIAL NAVIGATION;  Surgeon: Loreli Slot, MD;  Location:  [facility] OR;  Service: Thoracic;  Laterality: N/A;  . VIDEO BRONCHOSCOPY WITH ENDOBRONCHIAL ULTRASOUND N/A 06/05/2015   Procedure: VIDEO BRONCHOSCOPY WITH ENDOBRONCHIAL ULTRASOUND;  Surgeon: Loreli Slot, MD;  Location: [facility] OR;  Service: Thoracic;  Laterality: N/A;    Allergies  Allergen Reactions  . Codeine Other (See Comments)    Other reaction(s): Hallucination    Immunization History  Administered Date(s) Administered  . Influenza,inj,Quad PF,6+ Mos 12/21/2015    Family History  Problem Relation Age of Onset  . Arthritis Mother   . Hyperlipidemia Mother   . Hyperlipidemia Father   . Diabetes Father   . Breast cancer Maternal Grandmother   . Hypertension Maternal Grandmother   . Colon cancer Maternal Grandfather   .  Hypertension Maternal Grandfather   . Heart disease Paternal Grandfather      Current Outpatient Medications:  .  acetaminophen (TYLENOL) 325 MG tablet, Take 650 mg by mouth every 6 (six) hours as needed for mild pain or headache. , Disp: , Rfl:  .  traMADol (ULTRAM) 50 MG tablet, Take 1 tablet (50 mg total) by mouth every 6 (six) hours as needed., Disp: 40 tablet, Rfl: 1    Review of Systems     Objective:   Physical Exam  Constitutional: She is oriented to person, place, and time. She appears well-developed and well-nourished. No distress.  HENT:  Head: Normocephalic and atraumatic.  Right Ear: External ear normal.  Left Ear: External ear normal.  Mouth/Throat: Oropharynx is clear and moist. No oropharyngeal exudate.  Eyes: Pupils are equal, round, and reactive to light. Conjunctivae and EOM are normal. Right eye exhibits no discharge. Left eye exhibits no discharge. No scleral icterus.  Neck: Normal range of motion. Neck supple. No JVD present. No tracheal deviation present. No thyromegaly present.  Cardiovascular: Normal rate, regular rhythm, normal heart sounds and intact distal pulses. Exam reveals no gallop and no friction rub.  No murmur heard. Pulmonary/Chest: Effort normal and breath sounds normal. No respiratory distress. She has no wheezes. She has no rales. She exhibits no tenderness.  Abdominal: Soft. Bowel sounds are normal. She exhibits no distension and no mass. There is no tenderness. There is no rebound and no guarding.  Musculoskeletal: Normal range of motion. She exhibits no edema or tenderness.  Lymphadenopathy:    She has no cervical adenopathy.  Neurological: She is alert and oriented to person, place, and time. She has normal reflexes. No cranial nerve deficit. She exhibits normal muscle tone. Coordination normal.  Skin: Skin is warm and dry. No rash noted. She is not diaphoretic. No erythema. No pallor.  Psychiatric: She has a normal mood and affect. Her  behavior is normal. Judgment and thought content normal.  Vitals reviewed.  Vitals:   06/26/17 1630  BP: 108/78  Pulse: 87  SpO2: 99%  Weight: 144 lb (65.3 kg)  Height: 5\' 3"  (1.6 m)    Estimated body mass index is 25.51 kg/m as calculated from the following:   Height as of this encounter: 5\' 3"  (1.6 m).   Weight as of this encounter: 144 lb (65.3 kg).     Assessment:     Problem List Items Addressed This Visit    Pulmonary sarcoidosis (HCC)    Other Visit Diagnoses    Shortness of breath    -  Primary   Relevant Orders   Cardiopulmonary exercise test         Plan:      Try inspiratory muscle training -  30 times a day - gave you examples of device  - not sure if will work but worth trying Do CPST bike test with EIB challenge  Folllowup Return to see me after CPST bike test - next few to several weeks Might reconsider retest anca at followup  (> 50% of this 15 min visit spent in face to face counseling or/and coordination of care)   Dr. [name], M.D., [facility].C.P Pulmonary and Critical Care Medicine Staff Physician, [facility] Center Director - Interstitial Lung Disease  Program  Pulmonary Fibrosis [facility] Network at [facility] [place], [place], [address]  Pager: [phone], If no answer or between  15:00h - 7:00h: call [phone] Telephone: [phone]

## 2017-06-27 ENCOUNTER — Telehealth: Payer: Self-pay | Admitting: Internal Medicine

## 2017-06-27 NOTE — Telephone Encounter (Signed)
Spoke with pt, states that albuterol makes her shake.  Pt does not wish to take this.  Pt states she was on qvar previously but it is expired.  MR please advise if you wish for pt to continue taking qvar.  Thanks.

## 2017-06-27 NOTE — Telephone Encounter (Signed)
Dr. Marchelle Gearingamaswamy, did you want to prescribe an inhaler for pt yesterday when she was in the office? I didn't see anything in your notes. Please advise.   Patient Instructions by Kalman Shanamaswamy, Murali, MD at 06/26/2017 4:30 PM  Author: Kalman Shanamaswamy, Murali, MD Author Type: Physician Filed: 06/26/2017 4:54 PM  Note Status: Signed Cosign: Cosign Not Required Encounter Date: 06/26/2017  Editor: Kalman Shanamaswamy, Murali, MD (Physician)      ICD-10-CM   1. Shortness of breath R06.02   2. Pulmonary sarcoidosis (HCC) D86.0     Try inspiratory muscle training - 30 times a day - gave you examples of device             - not sure if will work but worth trying Do CPST bike test with EIB challenge  Folllowup Return to see me after CPST bike test - next few to several weeks    Instructions     ICD-10-CM   1. Shortness of breath R06.02   2. Pulmonary sarcoidosis (HCC) D86.0

## 2017-06-27 NOTE — Telephone Encounter (Signed)
Ok plesae list albuterol as allergy -shakes. I though she did not want QVAR because it did not help her. I am having a different recollection of my conversaton with her. My impreossion was for her to have CPST and we reassess. QVAR if it works can interfere with CPST so I like her to have CPST and then we can decide. If she feels otherwise, let me know and tomorrow 06/28/17 we can send QVAR  Dr. Kalman ShanMurali Saphronia Ozdemir, M.D., [facility]C.P Pulmonary and Critical Care Medicine Staff Physician, [facility] Center Director - Interstitial Lung Disease  Program  Pulmonary Fibrosis [facility] at [facility] [place], [place], [id number]  Pager: [phone], If no answer or between  15:00h - 7:00h: call [phone] Telephone: [phone]

## 2017-06-27 NOTE — Telephone Encounter (Signed)
She said albuterol did not help but I Was happy to give her a prescription and then I forgot. Ok to give albuerol as needed 1 canniester every 30 dayus with 3 refills   Dr. Kalman ShanMurali Ethyn Schetter, M.D., [facility].C.P Pulmonary and Critical Care Medicine Staff Physician, [facility] Center Director - Interstitial Lung Disease  Program  Pulmonary Fibrosis [facility] [place], [place], [id number]  Pager: [phone], If no answer or between  15:00h - 7:00h: call [phone] Telephone: [phone]

## 2017-06-28 NOTE — Telephone Encounter (Signed)
Spoke with patient. She stated that she wishes to wait until after her bike test to start the Qvar as she does not want it to cause her to have inaccurate results.   Advised patient to call the office after her test to see if MR could call in the Qvar for her. She verbalized understanding. Nothing else needed at time of call.

## 2017-07-10 ENCOUNTER — Encounter (HOSPITAL_COMMUNITY): Payer: 59

## 2017-07-27 ENCOUNTER — Encounter (HOSPITAL_COMMUNITY): Payer: 59

## 2017-08-10 ENCOUNTER — Ambulatory Visit: Payer: 59 | Admitting: Internal Medicine

## 2017-08-27 ENCOUNTER — Encounter: Payer: Self-pay | Admitting: Internal Medicine

## 2017-12-11 ENCOUNTER — Telehealth: Payer: Self-pay | Admitting: Internal Medicine

## 2017-12-11 NOTE — Telephone Encounter (Signed)
Spoke with pt. Pt is due for her 6 month ROV but she did not complete her CPST due to financial reasons. She is wondering if she should come in even though she did not complete the CPST. Advised her that we should still see her. Pt has been scheduled to see MR on 12/15/17 at 12pm. Nothing further was needed.

## 2017-12-15 ENCOUNTER — Ambulatory Visit (INDEPENDENT_AMBULATORY_CARE_PROVIDER_SITE_OTHER): Payer: 59 | Admitting: Internal Medicine

## 2017-12-15 ENCOUNTER — Encounter: Payer: Self-pay | Admitting: Internal Medicine

## 2017-12-15 VITALS — BP 120/82 | HR 87 | Ht 63.0 in | Wt 145.2 lb

## 2017-12-15 DIAGNOSIS — D860 Sarcoidosis of lung: Secondary | ICD-10-CM

## 2017-12-15 DIAGNOSIS — R0602 Shortness of breath: Secondary | ICD-10-CM

## 2017-12-15 NOTE — Patient Instructions (Signed)
ICD-10-CM   1. Pulmonary sarcoidosis (HCC) D86.0   2. Shortness of breath R06.02     Glad you are doing well Respect fact CPST test is expensive and you want to monitor Shortness of breath sounds like mild asthma  Plan  - xopenex as needed (instead of qvar) - take sample, if not available consider prescriptions  - albuterol causes tremors - flu shot with employer; defer with [place] 12/15/2017   Followup  - 1 year but call us sooner if problematic - we can also consider daily singulair tablet

## 2017-12-15 NOTE — Progress Notes (Signed)
37 yo Female former smoker seen for pulmonary consult 05/10/2005 by Dr. Marchelle Gearingamaswamy for shortness of breath and cough.  TEST Pulmonary function tests of 05/04/2015 normal  Autoimmune workup 05/06/2015 extensive panel is essentially normal except angiotensin-converting enzyme is elevated at 60 and c-ANCA is trace positive at 1:40 but PR 3 and MPO antibodies are negative.  05/06/2015 she also had CT chest with contrast and high resolution CT chest without contrast: There classic pulmonary infiltrates and hilar and subcarinal lymphadenopathy. Overall pattern is consistent with sarcoidosis radiologically according to the radiologist. I personally visualized this film.   Walking desat test 185 feet X 3 laps on RA -> did not desatrurate   06/29/2015 Follow up : Bx results Patient returns for a follow-up. Patient was seen last month for pulmonary consult for shortness of breath and cough and suspected sarcoid. Patient had been having cough, shortness of breath. Pulmonary function test was normal. An autoimmune panel in March was essentially negative except for cANCA that was trace positive. Ace level was elevated (PR-3 and MPO negative). CT chest showed pulmonary infiltrates and hilar and subcarinal lymphadenopathy. Patient was sent for a surgical consult. Patient underwent a bronchoscopy, endobronchial ultrasound and mediastinoscopy on 06/05/15 . Surgical pathology came back for nonnecrotizing granuloma and hyaline fibrosis in the lymph nodes. Lung biopsy showed no granulomas or malignancy. [facility] for acute illness with chills and severe nausea. She was started on prednisone 40 mg. She says that her acute symptoms have resolved. She has not seen any change in her breathing. She continues to have fatigue, cough. She denies any rash, chest pain, orthopnea, PND, hemoptysis, orthopnea. She says that she has gained some weight since starting prednisone. We discussed  healthy dietary choices and exercise. She says that she does have some visual changes. Over the last year. Recommend that she make an appointment with ophthalmology for a annual exam and that she will need annual eye exams yearly. She is currently on prednisone 40 mg daily   REC pred and taper and hold at 10mg  per day  OV 08/10/2015  Chief Complaint  Patient presents with  . Follow-up    Pt c/o occasional dry cough and SOB/chest tightness with exertion. Pt denies wheeze/CP. Pt states that her breathing is improving. She has noticed that weather does affect her breathing, mostly with extremes in air temperature and humidity.      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 At that point in time she already been on a few weeks of prednisone at 40 mg per day. At that visit 06/29/2015 she was better. She was asked to taper her prednisone and continue at 10 mg per day. However she ran out of the bottle and she stopped taking the prednisone one week ago. However it turns out that she is reluctant to take prednisone because of weight gain. She says she has gained 30 pounds of weight which is more than her pregnancy weight gain in the past. She does admit that prednisone helped her a lot. While shortness of breath cough chest tightness was all improved with prednisone significantly but now off prednisone for weeks symptoms are returning. The heat and humidity do bother her more significantly. Per hx - eeye exam - no sarcoid  Wondering about measures she could' do to coutneract weight gain   OV 11/16/2015  Chief Complaint  Patient presents with  . Follow-up  been off steriods for x 1 month and more sob faster, dry cough, chest pain and some tightness   Follow-up stage II pulmonary sarcoidosis-diagnosis established March 2017  Last visit June 2017. We reinitiated prednisone. However she went to the beach and forgot to take  prednisone. She has not taken prednisone in over a month. With this she says that her mood swings are better. She still has some sleep issues. Being off prednisone initially she was fine but now she feels the shortness of breath and cough are coming back. She struggling between managing the side effects of prednisone and the symptoms of sarcoidosis. Last chest x-ray 06/05/2015 at the time diagnosis was made. Last pulmonary function test was in February 2017 for diagnosis was made. 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   OV 06/26/2017 Chief Complaint  Patient presents with  . Follow-up    reports some SOB since weather has changed, overall states she has been okay.    37 year old female with stage II sarcoidosis on observation therapy  Presents for routine follow-up.  It has been over a year since I last saw her.  Overall in the past year she has been stable.  She continues to work Psychologist, sport and exercise at medical clinic.  She continues to feel shortness of breath on talking.  She feels like she cannot to get a good deep breath.  Long-acting beta agonist made her tachycardic.  Prednisone made her gain weight and since being off prednisone she has not lost that weight.  Prednisone also made her mood changes and these have improved/resolved after stopping prednisone.  She gets easily dyspneic on minimal exertion.  She says she is to be an athlete but is unable to do these activities.  She just feels like she needs to get a good deep breath.  She is open to the idea of having pulmonary stress test.  A cardiac stress test a few years ago apparently was normal.  She does not have any other clinical symptoms of sarcoidosis and she feels a sarcoidosis under control.  She is not keen on repeating a chest x-ray or having pulmonary function test.  Socially she has been busy getting her son into college and a son in high school and now  the son 57 years old    OV 12/15/2017  Subjective:  Patient ID: [name], female , DOB: 07-03-1980 , age 56 y.o. , MRN: [id number] , ADDRESS: [name] [facility] [id number]   12/15/2017 -   Chief Complaint  Patient presents with  . Follow-up    Did not do the cpst due to cost breathing is about the same.     HPI Madison Holt 37 y.o. 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  She is here for six-month follow-up.  She continues to have mild intermittent shortness of breath.  This happens episodically.  She takes Qvar and it goes away.  Heat makes it worse.  It almost sounds like asthma.  We recommended a pulmonary stress test but she has deferred it because of a high deductible cost to it.  She has not had a flu shot but will have it with her employer.  There are no other issues.  With the onset of fall she is actually feeling better.  At this point in time she just wants supportive and expectant follow-up.  She is willing to try bronchodilator as long as it does not cause of tremors.  Albuterol causes tremors.  She is open to trying to Xopenex if you have a sample.  She is not sure she wants to afford or pay for Xopenex.  Especially now that she is feeling better.     ROS - per HPI     has a past medical history of Pneumonia (2014), Sarcoidosis, and Shortness of breath dyspnea.   reports that she quit smoking about 19 years ago. Her smoking use included cigarettes. She has a 5.00 pack-year smoking history. She has never used smokeless tobacco.  Past Surgical History:  Procedure Laterality Date  . BILATERAL SALPINGECTOMY Bilateral 10/10/2016   Procedure: BILATERAL SALPINGECTOMY;  Surgeon: Christeen Douglas, MD;  Location: [facility] ORS;  Service: Gynecology;  Laterality: Bilateral;  . ESOPHAGOGASTRODUODENOSCOPY    . MEDIASTINOSCOPY N/A 06/05/2015   Procedure:  MEDIASTINOSCOPY;  Surgeon: Loreli Slot, MD;  Location: [facility] OR;  Service: Thoracic;   Laterality: N/A;  . VAGINAL HYSTERECTOMY N/A 10/10/2016   Procedure: HYSTERECTOMY VAGINAL;  Surgeon: Christeen Douglas, MD;  Location: [facility] ORS;  Service: Gynecology;  Laterality: N/A;  . VIDEO BRONCHOSCOPY WITH ENDOBRONCHIAL NAVIGATION N/A 06/05/2015   Procedure: VIDEO BRONCHOSCOPY WITH ENDOBRONCHIAL NAVIGATION;  Surgeon: Loreli Slot, MD;  Location: [facility] OR;  Service: Thoracic;  Laterality: N/A;  . VIDEO BRONCHOSCOPY WITH ENDOBRONCHIAL ULTRASOUND N/A 06/05/2015   Procedure: VIDEO BRONCHOSCOPY WITH ENDOBRONCHIAL ULTRASOUND;  Surgeon: Loreli Slot, MD;  Location: [facility] OR;  Service: Thoracic;  Laterality: N/A;    Allergies  Allergen Reactions  . Codeine Other (See Comments)    Other reaction(s): Hallucination  . Albuterol     Pt feels shaky.     Immunization History  Administered Date(s) Administered  . Influenza,inj,Quad PF,6+ Mos 12/21/2015    Family History  Problem Relation Age of Onset  . Arthritis Mother   . Hyperlipidemia Mother   . Hyperlipidemia Father   . Diabetes Father   . Breast cancer Maternal Grandmother   . Hypertension Maternal Grandmother   . Colon cancer Maternal Grandfather   . Hypertension Maternal Grandfather   . Heart disease Paternal Grandfather      Current Outpatient Medications:  .  acetaminophen (TYLENOL) 325 MG tablet, Take 650 mg by mouth every 6 (six) hours as needed for mild pain or headache. , Disp: , Rfl:  .  traMADol (ULTRAM) 50 MG tablet, Take 1 tablet (50 mg total) by mouth every 6 (six) hours as needed., Disp: 40 tablet, Rfl: 1      Objective:   Vitals:   12/15/17 1213  BP: 120/82  Pulse: 87  SpO2: 99%  Weight: 145 lb 3.2 oz (65.9 kg)  Height: 5\' 3"  (1.6 m)    Estimated body mass index is 25.72 kg/m as calculated from the following:   Height as of this encounter: 5\' 3"  (1.6 m).   Weight as of this encounter: 145 lb 3.2 oz (65.9 kg).  @WEIGHTCHANGE @  [employer]   12/15/17 1213  Weight: 145 lb 3.2 oz (65.9 kg)       Physical Exam  General Appearance:    Alert, cooperative, no distress, appears stated age - yes , Deconditioned looking - no , OBESE  - no, Sitting on Wheelchair -  no  Head:    Normocephalic, without obvious abnormality, atraumatic  Eyes:    PERRL, conjunctiva/corneas clear,  Ears:    Normal TM's and external ear canals, both ears  Nose:   Nares normal, septum midline, mucosa normal, no drainage    or sinus tenderness. OXYGEN ON  - no . Patient is @ ra   Throat:   Lips, mucosa, and tongue normal; teeth and gums normal. Cyanosis on lips - no  Neck:   Supple, symmetrical, trachea midline, no adenopathy;    thyroid:  no enlargement/tenderness/nodules; no carotid   bruit or JVD  Back:     Symmetric, no curvature, ROM normal, no CVA tenderness  Lungs:     Distress - no , Wheeze no, Barrell Chest - no, Purse lip breathing - no, Crackles - no   Chest Wall:    No tenderness or deformity.    Heart:    Regular rate and rhythm, S1 and S2 normal, no rub   or gallop, Murmur - no  Breast Exam:    NOT DONE  Abdomen:     Soft, non-tender, bowel sounds active all four quadrants,    no masses, no organomegaly. Visceral obesity - no  Genitalia:   NOT DONE  Rectal:   NOT DONE  Extremities:   Extremities - normal, Has Cane - no, Clubbing - no, Edema - no  Pulses:   2+ and symmetric all extremities  Skin:   Stigmata of Connective Tissue Disease - no  Lymph nodes:   Cervical, supraclavicular, and axillary nodes normal  Psychiatric:  Neurologic:   Pleasant - yes, Anxious - no, Flat affect - no  CAm-ICU - neg, Alert and Oriented x 3 - yes, Moves all 4s - yes, Speech - normal, Cognition - intact           Assessment:       ICD-10-CM   1. Pulmonary sarcoidosis (HCC) D86.0   2. Shortness of breath R06.02        Plan:     Patient Instructions     ICD-10-CM   1. Pulmonary sarcoidosis (HCC) D86.0   2. Shortness of breath R06.02     Glad you are doing well Respect fact CPST test is  expensive and you want to monitor Shortness of breath sounds like mild asthma  Plan  - xopenex as needed (instead of qvar) - take sample, if not available consider prescriptions  - albuterol causes tremors - flu shot with employer; defer with [place] 12/15/2017   Followup  - 1 year but call us sooner if problematic - we can also consider daily singulair tablet  (> 50% of this 15 min visit spent in face to face counseling or/and coordination of care by this undersigned MD - Dr [name]. This includes one or more of the following documented above: discussion of test results, diagnostic or treatment recommendations, prognosis, risks and benefits of management options, instructions, education, compliance or risk-factor reduction)    SIGNATURE    Dr. [name], M.D., F.C.C.P,  Pulmonary and Critical Care Medicine Staff Physician, [facility] Center Director - Interstitial Lung Disease  Program  Pulmonary Fibrosis [facility] at [facility] [place], [place], [address]  Pager: [phone], If no answer or between  15:00h - 7:00h: call [phone] Telephone: [phone]  12:43 PM 12/15/2017

## 2018-03-14 ENCOUNTER — Telehealth: Payer: Self-pay | Admitting: Internal Medicine

## 2018-03-14 DIAGNOSIS — R0602 Shortness of breath: Secondary | ICD-10-CM

## 2018-03-14 DIAGNOSIS — J849 Interstitial pulmonary disease, unspecified: Secondary | ICD-10-CM

## 2018-03-14 NOTE — Telephone Encounter (Signed)
Spoke with the pt  She states went to [facility] with c/o fever, chills, SOB and cough on 03/12/18 She states onset of symptoms was 03/10/18  They gave omnicef and pred  She had allergic reaction to omnicef and was then started on doxy  She is feeling some better  She reports no more fever but her breathing is not back to baseline yet  She states they did cxr and told her ct was needed  I have called and requested the report to be faxed over  MR- do you want to order a ct chest or see her for ov? You have a few blocked spots on your schedule  Please advise thanks

## 2018-03-14 NOTE — Telephone Encounter (Signed)
Call made to patient, made aware per MR: Reviewed cxr report - the upper lobe findings and her age and known sarcoid make me suspicious this is sarcoid and sarcoid nodules can go up/down with time and might/might not correlate with symptoms. My recommendation is that she wait for 4 weeks and do CT chest with contrast and return to clinic for followup (last CT march 2017 and so will be a 3 year CT).   4 week appointment made, order for Chest ct placed. Voiced understanding. Nothing further needed at this time.

## 2018-03-14 NOTE — Telephone Encounter (Signed)
Reviewed cxr report - the upper lobe findings and her age and known sarcoid make me suspicious this is sarcoid and sarcoid nodules can go up/down with time and might/might not correlate with symptoms. My recommendation is that she wait for 4 weeks and do CT chest with contrast and return to clinic for followup (last CT march 2017 and so will be a 3 year CT)

## 2018-04-03 ENCOUNTER — Telehealth: Payer: Self-pay | Admitting: Internal Medicine

## 2018-04-03 NOTE — Telephone Encounter (Signed)
I have cancelled the CT at [facility] for 04/04/18 and it has been rescheduled for 04/09/2018 @ 8:30am. Patient is aware of appt and [address] [place], [place] [facility] location.

## 2018-04-03 NOTE — Telephone Encounter (Signed)
Spoke with pt. She is scheduled to have a CT done tomorrow at [facility]. Pt is [facility] to have [facility] as the out of pocket cost will be less.  [facility] please advise. Thanks.

## 2018-04-04 ENCOUNTER — Ambulatory Visit: Admission: RE | Admit: 2018-04-04 | Payer: Managed Care, Other (non HMO) | Source: Ambulatory Visit

## 2018-04-09 ENCOUNTER — Ambulatory Visit
Admission: RE | Admit: 2018-04-09 | Discharge: 2018-04-09 | Disposition: A | Discharge status: Home / Self Care | Payer: Managed Care, Other (non HMO) | Source: Ambulatory Visit | Attending: Internal Medicine | Admitting: Internal Medicine

## 2018-04-09 ENCOUNTER — Other Ambulatory Visit: Payer: Managed Care, Other (non HMO)

## 2018-04-09 DIAGNOSIS — J849 Interstitial pulmonary disease, unspecified: Secondary | ICD-10-CM

## 2018-04-09 DIAGNOSIS — R0602 Shortness of breath: Secondary | ICD-10-CM

## 2018-04-09 MED ORDER — IOPAMIDOL (ISOVUE-300) INJECTION 61%
75.0000 mL | Freq: Once | INTRAVENOUS | Status: AC | PRN
  Administered 2018-04-09: 75 mL via INTRAVENOUS

## 2018-04-11 ENCOUNTER — Ambulatory Visit: Payer: Managed Care, Other (non HMO) | Admitting: Internal Medicine

## 2018-04-11 ENCOUNTER — Encounter: Payer: Self-pay | Admitting: Internal Medicine

## 2018-04-11 VITALS — BP 102/70 | Ht 63.0 in | Wt 145.4 lb

## 2018-04-11 DIAGNOSIS — D860 Sarcoidosis of lung: Secondary | ICD-10-CM | POA: Diagnosis not present

## 2018-04-11 DIAGNOSIS — J849 Interstitial pulmonary disease, unspecified: Secondary | ICD-10-CM

## 2018-04-11 LAB — HEPATIC FUNCTION PANEL
ALBUMIN: 4.4 g/dL (ref 3.5–5.2)
ALT: 30 U/L (ref 0–35)
AST: 29 U/L (ref 0–37)
Alkaline Phosphatase: 48 U/L (ref 39–117)
Bilirubin, Direct: 0.1 mg/dL (ref 0.0–0.3)
TOTAL PROTEIN: 7.6 g/dL (ref 6.0–8.3)
Total Bilirubin: 0.8 mg/dL (ref 0.2–1.2)

## 2018-04-11 NOTE — Patient Instructions (Addendum)
ICD-10-CM   1. Pulmonary sarcoidosis (HCC) D86.0   2. 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 Not sure if there was before  Regarding palpitations  - understood there has been no holter evaluation in past  Plan  - understood intolerance to methotrexate and prednisone  - from a lung stand point I think we can watch without specific sarcoid treatment - for palpitations: please ask your cardiologist if there is benefit in holter or remote cardiac monitoring  - regarding liver: do LFT blood work 04/11/2018 (last 2018)  - Regarding spleen: need to understand if spleen needs to be treated  - regarding: brain and heart - I  need to understand if we need to do screening MRI for brain/heart  - will discuss these questions at our case conference in Feb/MArch 2020 and review literature  - 2nd opinion if needed  Followup  - 6 months do spirometry pre and post bd and dlco  - return to clinic in 6 months or sooner if needed

## 2018-04-11 NOTE — Progress Notes (Signed)
38 yo Female former smoker seen for pulmonary consult 05/10/2005 by Dr. Marchelle Gearingamaswamy for shortness of breath and cough.  TEST Pulmonary function tests of 05/04/2015 normal  Autoimmune workup 05/06/2015 extensive panel is essentially normal except angiotensin-converting enzyme is elevated at 60 and c-ANCA is trace positive at 1:40 but PR 3 and MPO antibodies are negative.  05/06/2015 she also had CT chest with contrast and high resolution CT chest without contrast: There classic pulmonary infiltrates and hilar and subcarinal lymphadenopathy. Overall pattern is consistent with sarcoidosis radiologically according to the radiologist. I personally visualized this film.   Walking desat test 185 feet X 3 laps on RA -> did not desatrurate   06/29/2015 Follow up : Bx results Patient returns for a follow-up. Patient was seen last month for pulmonary consult for shortness of breath and cough and suspected sarcoid. Patient had been having cough, shortness of breath. Pulmonary function test was normal. An autoimmune panel in March was essentially negative except for cANCA that was trace positive. Ace level was elevated (PR-3 and MPO negative). CT chest showed pulmonary infiltrates and hilar and subcarinal lymphadenopathy. Patient was sent for a surgical consult. Patient underwent a bronchoscopy, endobronchial ultrasound and mediastinoscopy on 06/05/15 . Surgical pathology came back for nonnecrotizing granuloma and hyaline fibrosis in the lymph nodes. Lung biopsy showed no granulomas or malignancy. [facility] for acute illness with chills and severe nausea. She was started on prednisone 40 mg. She says that her acute symptoms have resolved. She has not seen any change in her breathing. She continues to have fatigue, cough. She denies any rash, chest pain, orthopnea, PND, hemoptysis, orthopnea. She says that she has gained some weight since starting prednisone. We discussed  healthy dietary choices and exercise. She says that she does have some visual changes. Over the last year. Recommend that she make an appointment with ophthalmology for a annual exam and that she will need annual eye exams yearly. She is currently on prednisone 40 mg daily   REC pred and taper and hold at 10mg  per day  OV 08/10/2015  Chief Complaint  Patient presents with  . Follow-up    Pt c/o occasional dry cough and SOB/chest tightness with exertion. Pt denies wheeze/CP. Pt states that her breathing is improving. She has noticed that weather does affect her breathing, mostly with extremes in air temperature and humidity.      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 At that point in time she already been on a few weeks of prednisone at 40 mg per day. At that visit 06/29/2015 she was better. She was asked to taper her prednisone and continue at 10 mg per day. However she ran out of the bottle and she stopped taking the prednisone one week ago. However it turns out that she is reluctant to take prednisone because of weight gain. She says she has gained 30 pounds of weight which is more than her pregnancy weight gain in the past. She does admit that prednisone helped her a lot. While shortness of breath cough chest tightness was all improved with prednisone significantly but now off prednisone for weeks symptoms are returning. The heat and humidity do bother her more significantly. Per hx - eeye exam - no sarcoid  Wondering about measures she could' do to coutneract weight gain   OV 11/16/2015  Chief Complaint  Patient presents with  . Follow-up  been off steriods for x 1 month and more sob faster, dry cough, chest pain and some tightness   Follow-up stage II pulmonary sarcoidosis-diagnosis established March 2017  Last visit June 2017. We reinitiated prednisone. However she went to the beach and forgot to take  prednisone. She has not taken prednisone in over a month. With this she says that her mood swings are better. She still has some sleep issues. Being off prednisone initially she was fine but now she feels the shortness of breath and cough are coming back. She struggling between managing the side effects of prednisone and the symptoms of sarcoidosis. Last chest x-ray 06/05/2015 at the time diagnosis was made. Last pulmonary function test was in February 2017 for diagnosis was made. 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   OV 06/26/2017 Chief Complaint  Patient presents with  . Follow-up    reports some SOB since weather has changed, overall states she has been okay.    38 year old female with stage II sarcoidosis on observation therapy  Presents for routine follow-up.  It has been over a year since I last saw her.  Overall in the past year she has been stable.  She continues to work Psychologist, sport and exercise at medical clinic.  She continues to feel shortness of breath on talking.  She feels like she cannot to get a good deep breath.  Long-acting beta agonist made her tachycardic.  Prednisone made her gain weight and since being off prednisone she has not lost that weight.  Prednisone also made her mood changes and these have improved/resolved after stopping prednisone.  She gets easily dyspneic on minimal exertion.  She says she is to be an athlete but is unable to do these activities.  She just feels like she needs to get a good deep breath.  She is open to the idea of having pulmonary stress test.  A cardiac stress test a few years ago apparently was normal.  She does not have any other clinical symptoms of sarcoidosis and she feels a sarcoidosis under control.  She is not keen on repeating a chest x-ray or having pulmonary function test.  Socially she has been busy getting her son into college and a son in high school and now  the son 57 years old    OV 12/15/2017  Subjective:  Patient ID: [name], female , DOB: 07-03-1980 , age 56 y.o. , MRN: [id number] , ADDRESS: [address] [name] [facility] [id number]   12/15/2017 -   Chief Complaint  Patient presents with  . Follow-up    Did not do the cpst due to cost breathing is about the same.     HPI ELOWYN RAUPP 38 y.o. 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  She is here for six-month follow-up.  She continues to have mild intermittent shortness of breath.  This happens episodically.  She takes Qvar and it goes away.  Heat makes it worse.  It almost sounds like asthma.  We recommended a pulmonary stress test but she has deferred it because of a high deductible cost to it.  She has not had a flu shot but will have it with her employer.  There are no other issues.  With the onset of fall she is actually feeling better.  At this point in time she just wants supportive and expectant follow-up.  She is willing to try bronchodilator as long as it does not cause of tremors.  Albuterol causes tremors.  She is open to trying to Xopenex if you have a sample.  She is not sure she wants to afford or pay for Xopenex.  Especially now that she is feeling better.    OV 04/11/2018  Subjective:  Patient ID: [name], female , DOB: [birth year] , age 30 y.o. , MRN: [id number] , ADDRESS: [address] [name] [facility] [id number]   04/11/2018 -   Chief Complaint  Patient presents with  . Results    Chest CT   Stage II pulmonary sarcoidosis on observation therapy  HPI JOLIET DANGEL 38 y.o. -presents for follow-up.  At this point in time she feels stable with her baseline exertional dyspnea and fatigue.  There are no new issues other than the fact earlier in January 2020 she had respiratory infection.  With doxycycline and prednisone it improved and she is back to baseline.  Of note she did get Omnicef and discussed diarrhea and she had to stop.  She  had a chest x-ray during this time and there was concern for pulmonary infiltrates.  Therefore because it is been 3 years since we did a CT chest we got a CT chest and she is here to review.  I personally visualized the CT chest.  To me the mediastinal nodes and the pulmonary infiltrates appear stable and in fact the pulmonary infiltrates may be slightly improved.  According to the radiologist the mediastinal nodes are stable but the pulmonary infiltrates some areas have improved in some areas of worse.  We did spirometry today in the office and it shows continued stability in the last 2 years.  Results are below.  She wanted to discuss the implications of this.  She cannot handle methotrexate and prednisone.  Therefore she is averse to taking this.  At this point in time because of good quality of life other than mild shortness of breath and fatigue she is fairly content with observation therapy.  The bigger question is that on the CT chest hypodense lesions were seen in the spleen raising the question of splenic sarcoidosis.  She says she has palpitations for a long time.  She seen cardiology was done echocardiogram.  This was in [place] those records are not available.  The cardiologist at [facility].  However she says a Holter monitor has not been done.  She had a recent eye exam which was normal.  However, she is worried about spread of sarcoidosis to the liver, brain and cardiac tissue.  We discussed about possible screening versus and also treatment of the splenic lesions.  We discussed the possibility of a second opinion at [facility] [place] or [place].  But at this point in time we resolved that we would discuss locally   Results for JEAN, BRYANS (MRN [id number]) as of 04/11/2018 09:03  Ref. Range 05/04/2015 09:59 03/29/2016 15:06 04/11/2018 [facility] office  FVC-Pre Latest Units: L 3.86 3.71 3.7  FVC-%Pred-Pre Latest Units: % 103 99 102%  FEV1-Pre Latest Units: L 2.69 2.57 2.5/    FEV1-%Pred-Pre Latest Units: % 87 83 86%   Results for KEYWANNA, WENIG (MRN [id number]) as of 04/11/2018 09:03  Ref. Range 05/04/2015 09:59 03/29/2016 15:06  DLCO unc Latest Units: ml/min/mmHg 21.37 21.71  DLCO unc % pred Latest Units: % 88 89   CT chest 04/09/2018 IMPRESSION: 1. Mild mediastinal and bilateral hilar lymphadenopathy is stable  to slightly increased since 05/06/2015 CT, compatible with sarcoidosis. 2. Upper lung predominant pulmonary parenchymal findings of sarcoidosis, with mild mixed changes including worsening confluent regions of bandlike and nodular consolidation in both lungs likely reflecting a combination of confluent nodularity and fibrosis, and with some improvement in the fine perilymphatic nodularity previously visualized. 3. Numerous hypodense splenic lesions, not previously discretely visualized (which may be due to technical differences in contrast timing), compatible with sarcoidosis.   Electronically Signed   By: Delbert PhenixJason A Poff M.D.   On: 04/10/2018 09:14  ROS - per HPI     has a past medical history of Pneumonia (2014), Sarcoidosis, and Shortness of breath dyspnea.   reports that she quit smoking about 20 years ago. Her smoking use included cigarettes. She has a 5.00 pack-year smoking history. She has never used smokeless tobacco.  Past Surgical History:  Procedure Laterality Date  . BILATERAL SALPINGECTOMY Bilateral 10/10/2016   Procedure: BILATERAL SALPINGECTOMY;  Surgeon: Christeen DouglasBeasley, Bethany, MD;  Location: [facility] ORS;  Service: Gynecology;  Laterality: Bilateral;  . ESOPHAGOGASTRODUODENOSCOPY    . MEDIASTINOSCOPY N/A 06/05/2015   Procedure:  MEDIASTINOSCOPY;  Surgeon: Loreli SlotSteven C Hendrickson, MD;  [facility];  Service: Thoracic;  Laterality: N/A;  . VAGINAL HYSTERECTOMY N/A 10/10/2016   Procedure: HYSTERECTOMY VAGINAL;  Surgeon: Christeen DouglasBeasley, Bethany, MD;  Location: [facility] ORS;  Service: Gynecology;  Laterality: N/A;  . VIDEO BRONCHOSCOPY WITH  ENDOBRONCHIAL NAVIGATION N/A 06/05/2015   Procedure: VIDEO BRONCHOSCOPY WITH ENDOBRONCHIAL NAVIGATION;  Surgeon: Loreli SlotSteven C Hendrickson, MD;  [facility];  Service: Thoracic;  Laterality: N/A;  . VIDEO BRONCHOSCOPY WITH ENDOBRONCHIAL ULTRASOUND N/A 06/05/2015   Procedure: VIDEO BRONCHOSCOPY WITH ENDOBRONCHIAL ULTRASOUND;  Surgeon: Loreli SlotSteven C Hendrickson, MD;  Location: [facility] OR;  Service: Thoracic;  Laterality: N/A;    Allergies  Allergen Reactions  . Codeine Other (See Comments)    Other reaction(s): Hallucination  . Albuterol     Pt feels shaky.   Truman Hayward. Omnicef [Cefdinir] Diarrhea    Immunization History  Administered Date(s) Administered  . Influenza,inj,Quad PF,6+ Mos 12/21/2015, 01/03/2018    Family History  Problem Relation Age of Onset  . Arthritis Mother   . Hyperlipidemia Mother   . Hyperlipidemia Father   . Diabetes Father   . Breast cancer Maternal Grandmother   . Hypertension Maternal Grandmother   . Colon cancer Maternal Grandfather   . Hypertension Maternal Grandfather   . Heart disease Paternal Grandfather      Current Outpatient Medications:  .  acetaminophen (TYLENOL) 325 MG tablet, Take 650 mg by mouth every 6 (six) hours as needed for mild pain or headache. , Disp: , Rfl:  .  traMADol (ULTRAM) 50 MG tablet, Take 1 tablet (50 mg total) by mouth every 6 (six) hours as needed., Disp: 40 tablet, Rfl: 1      Objective:   Vitals:   04/11/18 0856  BP: 102/70  Weight: 145 lb 6.4 oz (66 kg)  Height: 5\' 3"  (1.6 m)    Estimated body mass index is 25.76 kg/m as calculated from the following:   Height as of this encounter: 5\' 3"  (1.6 m).   Weight as of this encounter: 145 lb 6.4 oz (66 kg).  @WEIGHTCHANGE @  [employer]   04/11/18 0856  Weight: 145 lb 6.4 oz (66 kg)     Physical Exam  General Appearance:    Alert, cooperative, no distress, appears stated age - yes , Deconditioned looking - no , OBESE  - no, Sitting on Wheelchair -  no  Head:     Normocephalic, without obvious abnormality, atraumatic  Eyes:    PERRL, conjunctiva/corneas clear,  Ears:    Normal TM's and external ear canals, both ears  Nose:   Nares normal, septum midline, mucosa normal, no drainage    or sinus tenderness. OXYGEN ON  - no . Patient is @ ra   Throat:   Lips, mucosa, and tongue normal; teeth and gums normal. Cyanosis on lips - no  Neck:   Supple, symmetrical, trachea midline, no adenopathy;    thyroid:  no enlargement/tenderness/nodules; no carotid   bruit or JVD  Back:     Symmetric, no curvature, ROM normal, no CVA tenderness  Lungs:     Distress - no , Wheeze no, Barrell Chest - non, Purse lip breathing - no, Crackles - no   Chest Wall:    No tenderness or deformity.    Heart:    Regular rate and rhythm, S1 and S2 normal, no rub   or gallop, Murmur - no  Breast Exam:    NOT DONE  Abdomen:     Soft, non-tender, bowel sounds active all four quadrants,    no masses, no organomegaly. Visceral obesity - no  Genitalia:   NOT DONE  Rectal:   NOT DONE  Extremities:   Extremities - normal, Has Cane - no, Clubbing - no, Edema - no  Pulses:   2+ and symmetric all extremities  Skin:   Stigmata of Connective Tissue Disease - no  Lymph nodes:   Cervical, supraclavicular, and axillary nodes normal  Psychiatric:  Neurologic:   Pleasant - yes, Anxious - mild maybe, Flat affect - no  CAm-ICU - neg, Alert and Oriented x 3 - yes, Moves all 4s - yes, Speech - normal, Cognition - intact           Assessment:       ICD-10-CM   1. Pulmonary sarcoidosis (HCC) D86.0   2. ILD (interstitial lung disease) (HCC) J84.9 Spirometry with Graph       Plan:     Patient Instructions     ICD-10-CM   1. Pulmonary sarcoidosis (HCC) D86.0   2. ILD (interstitial lung disease) (HCC) J84.9 Spirometry with Graph   Regarding lungs - Glad you feel stable - On CT chest 2017 -> 2020 looks stable to me - OFfice spirometry 2018 ->2020 looks stable - so, overall  stable  Regarding spleen  - there is evidence of sarcoid in spleen in 2020. Not sure if there was before  Regarding palpitations  - understood there has been no holter evaluation in past  Plan  - understood intolerance to methotrexate and prednisone  - from a lung stand point I think we can watch without specific sarcoid treatment - for palpitations: please ask your cardiologist if there is benefit in holter or remote cardiac monitoring  - regarding liver: do LFT blood work 04/11/2018 (last 2018)  - Regarding spleen: need to understand if spleen needs to be treated  - regarding: brain and heart - I  need to understand if we need to do screening MRI for brain/heart  - will discuss these questions at our case conference in Feb/MArch 2020 and review literature  - 2nd opinion if needed  Followup  - 6 months do spirometry pre and post bd and dlco  - return to clinic in 6 months or sooner if needed    > 50% of this > 40 min visit spent in face to  face counseling or/and coordination of care - by this undersigned MD - Dr Kalman ShanMurali Garritt [place]. This includes one or more of the following documented above: discussion of test results, diagnostic or treatment recommendations, prognosis, risks and benefits of management options, instructions, education, compliance or risk-factor reduction   SIGNATURE    Dr. Kalman ShanMurali Tavarion Babington, M.D., F.C.C.P,  Pulmonary and Critical Care Medicine Staff Physician, [facility] Director - Interstitial Lung Disease  Program  Pulmonary Fibrosis [facility] [place], [place], [id number]  Pager: [phone], If no answer or between  15:00h - 7:00h: call [phone] Telephone: [phone]  9:39 AM 04/11/2018

## 2018-04-19 ENCOUNTER — Telehealth: Payer: Self-pay | Admitting: Internal Medicine

## 2018-04-19 NOTE — Telephone Encounter (Signed)
Plan  - understood intolerance to methotrexate and prednisone  - from a lung stand point I think we can watch without specific sarcoid treatment - for palpitations: please ask your cardiologist if there is benefit in holter or remote cardiac monitoring  - regarding liver: do LFT blood work 04/11/2018 (last 2018)  - Regarding spleen: need to understand if spleen needs to be treated  - regarding: brain and heart - I  need to understand if we need to do screening MRI for brain/heart             - will discuss these questions at our case conference in Feb/MArch 2020 and review literature             - 2nd opinion if needed  Followup  - 6 months do spirometry pre and post bd and dlco  - return to clinic in 6 months or sooner if needed ______________________________________________________________________________________  Madison Holt and spoke with patient, she stated that she was calling in regards to her sarcoidosis that MR was going to discuss with his colleagues. Patient wanted to know if he was able to discuss her case and if a decision has been made. MR please advise, thank you.

## 2018-04-19 NOTE — Telephone Encounter (Signed)
LVM for Dr. Marcina Millard MD with [facility] at phone [phone] to return call back to MR office or mobile number as soon as possible. X1

## 2018-04-19 NOTE — Telephone Encounter (Signed)
Returning call cb# [phone]

## 2018-04-19 NOTE — Telephone Encounter (Signed)
Paged MR regarding below message as requested. He prefers to call back the patient today Called MR, he advised to route message back to him  Mr please advise. Thank you.

## 2018-04-19 NOTE — Telephone Encounter (Signed)
Discussed in case conference and also with a colleague informally at the [facility] who is a sarcoid expert  1. Lungs - at case conference it was felt sarcoid in the lung was burning out - and becoming more like a scar (this wil explain why one area is up and one area is down). So for this just monitor  2. Spleen -  At case conference they could not see sarcoid on the old scan and this because of the way the scan was done. So, sarcoid is there in the current scan but we have no idea how new it is. But per my friend in [place], because she is not having night sweats, chills, weight loss etc., probably best to watch it.   3. Brain - no role to do screening MRI or CT brain. So follow clinically. We do not think there is sarcoid in the brain  4. Liver -her LFT was normal. Did not see sarcoid in liver on CT. So, do not think there is sarcoid in the liver  5. Heart - given palpitations though she has had normal ECHO, the [place] doc thought to be on safe side should get MRI heart +/- holter monitor. I can d/w her cardiologist about this   Thanks  M<R  PS: if she calls please page me or call me so I can call her back straight away. You can give the above info to get message out but probably best I had talk with her.  I just called her but went to voice mail

## 2018-04-19 NOTE — Telephone Encounter (Signed)
Triage - please get hold of Dr Marcina Millard for me - [facility] - needs MRI heart for sarcoid. You can give him my cell  Thanks  MR  .....................................................................Marland Kitchen  Discussed in case conference and also with a colleague informally at the [facility] who is a sarcoid expert. I spoke to her blow and she agreed  1. Lungs - at case conference it was felt sarcoid in the lung was burning out - and becoming more like a scar (this wil explain why one area is up and one area is down). So for this just monitor  2. Spleen -  At case conference they could not see sarcoid on the old scan and this because of the way the scan was done. So, sarcoid is there in the current scan but we have no idea how new it is. But per my friend in [place], because she is not having night sweats, chills, weight loss etc., probably best to watch it.   3. Brain - no role to do screening MRI or CT brain. So follow clinically. We do not think there is sarcoid in the brain  4. Liver -her LFT was normal. Did not see sarcoid in liver on CT. So, do not think there is sarcoid in the liver  5. Heart - given palpitations though she has had normal ECHO, the [place] doc thought to be on safe side should get MRI heart +/- holter monitor. I can d/w her cardiologist about this. She gave me name of her cardiologist - Dr Marcina Millard- [facility]   Thanks  MR

## 2018-04-23 NOTE — Telephone Encounter (Signed)
Called and spoke with Marlyn Corporal [facility], [phone]. She stated that Dr Darrold Junker and his Nurse returned to the office today, they were both off Friday, so any message Friday, may not have reached Dr Darrold Junker.  Elease Hashimoto, took Dr Gardner Candle cell number and was going to give it to Dr Darrold Junker to return his call.

## 2018-04-26 NOTE — Telephone Encounter (Signed)
Message previously routed to Dr Marchelle Gearing.

## 2018-05-01 NOTE — Telephone Encounter (Signed)
MR please advise if you were able to speak with Mrs. Portugal's cardiologist. If not we can follow up with his nurse to have him give you a call back. Thank you.

## 2018-05-01 NOTE — Telephone Encounter (Signed)
He never called back. If you can ask him to give a call would be great. You can also tell patient Madison Holt to tell cardiologist that we are recommending a cardiac MRI for sarcoid

## 2018-05-01 NOTE — Telephone Encounter (Signed)
Called pt and advised message from the provider. Pt understood and verbalized understanding. Nothing further is needed.    

## 2018-06-04 ENCOUNTER — Telehealth: Payer: Self-pay | Admitting: Internal Medicine

## 2018-06-04 NOTE — Telephone Encounter (Signed)
She states she was given samples of Qvar and she would like it sent in to her pharmacy. [facility] [address]. Made aware the AVS states she should be taking xopenex instead of Qvar, She states her and MR spoke about this last OV and it was a mistake. Made aware I would need to get permission to send this in because avs state something different. Voiced understanding. Last qvar sample was 2018 according to epic.   BM please advise if we can send in Qvar.

## 2018-06-04 NOTE — Telephone Encounter (Signed)
I agree it appears that it should be xopenex. What dose of QVAR sample does she have? I could consider refilling it if its low dose.    I have worked here for a year and have no recollection of QVAR samples over that time period.   Elisha Headland FNP

## 2018-06-05 MED ORDER — BECLOMETHASONE DIPROP HFA 40 MCG/ACT IN AERB: 1.0000 | INHALATION_SPRAY | Freq: Two times a day (BID) | RESPIRATORY_TRACT | 2 refills | Status: DC

## 2018-06-05 NOTE — Telephone Encounter (Signed)
Right.   WHAT DOSE QVAR DOES SHE WANT REFILLED?  Elisha Headland FNP

## 2018-06-05 NOTE — Telephone Encounter (Signed)
Qvar 40 mg sent with 2 additional refills. Pt already has 2 recall for 6 mos f/u and pre and post spiro. Nothing further needed.

## 2018-06-05 NOTE — Telephone Encounter (Signed)
Called and spoke with patient, she would like the QVAR 40 to be refilled. Thank you.

## 2018-06-05 NOTE — Telephone Encounter (Signed)
04/11/18 visit: HPI Madison Holt 38 y.o. 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  She is here for six-month follow-up.  She continues to have mild intermittent shortness of breath.  This happens episodically.  She takes Qvar and it goes away.  Heat makes it worse.  It almost sounds like asthma.  We recommended a pulmonary stress test but she has deferred it because of a high deductible cost to it.  She has not had a flu shot but will have it with her employer.  There are no other issues.  With the onset of fall she is actually feeling better.  At this point in time she just wants supportive and expectant follow-up.  She is willing to try bronchodilator as long as it does not cause of tremors.  Albuterol causes tremors.  She is open to trying to Xopenex if you have a sample.  She is not sure she wants to afford or pay for Xopenex.  Especially now that she is feeling better.   Pt states samples of Xopenex was not available therefore she decided to continue with Qvar 40. She states she does benefit from it.

## 2018-06-05 NOTE — Telephone Encounter (Signed)
Left message for patient to call back to see if she wants Qvar 40 or 80.

## 2018-06-05 NOTE — Telephone Encounter (Signed)
Okay to refill. With 2 refills. Make sure patient has 3-6 month follow up with MR to readdress  Arlys John

## 2018-09-04 NOTE — Progress Notes (Signed)
Mavi Un T. Briell Paulette, MD Primary Care and Sports Medicine [facility] [address] [name], [id number] Phone: [phone]  FAX: [phone]  Ian BushmanJennifer L Coppedge - 38 y.o. female  MRN [id number]  Date of Birth: Apr 02, 1980  Visit Date: 09/05/2018  PCP: Hannah Beatopland, Nekesha Font, MD  Referred by: Hannah Beatopland, Lasharon Dunivan, MD  Chief Complaint  Patient presents with  . Fatigue    wants b12 level checked   Subjective:   Ian BushmanJennifer L Shadowens is a 38 y.o. very pleasant female patient who presents with the following:  Boneta LucksJenny is a 38 year old patient with pulmonary sarcoidosis and interstitial lung disease who presents with some questions regarding laboratories and further work-up.  Sarcoid does cause fatigue.  Exercises and feels tired all the time.  Does get headaches.  No OSA sx  No dep sx  Minimal ETOh.  No drugs.  Had a cardiac MRI.   She has been having fatigue that is pretty bad for some time.  She is followed by pulmonary for pulmonary sarcoidosis, but she is not hypoxic currently.  She does use inhaled steroids.  She also had a cardiac work-up with a normal cardiac MRI earlier in the year.  She is not had any significant lab work done in a couple of years.   Past Medical History, Surgical History, Social History, Family History, Problem List, Medications, and Allergies have been reviewed and updated if relevant.  Patient Active Problem List   Diagnosis Date Noted  . Abnormal uterine bleeding (AUB) 10/10/2016  . Leukopenia 07/12/2016  . Influenza A 06/06/2016  . Other fatigue 01/01/2016  . ILD (interstitial lung disease) (HCC) 05/11/2015  . Mediastinal adenopathy 05/11/2015  . 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 Pulmonary infiltrate present on computed tomography 05/11/2015    Past Medical History:  Diagnosis Date  . Pneumonia 2014   Hosp x 1 week / again 2015  . Sarcoidosis   . Shortness of breath dyspnea    doe/ h/o sarcoidosis  since bx 3/17    Past Surgical History:  Procedure Laterality Date  . BILATERAL SALPINGECTOMY Bilateral 10/10/2016   Procedure: BILATERAL SALPINGECTOMY;  Surgeon: Christeen DouglasBeasley, Bethany, MD;  Location: [facility] ORS;  Service: Gynecology;  Laterality: Bilateral;  . ESOPHAGOGASTRODUODENOSCOPY    . MEDIASTINOSCOPY N/A 06/05/2015   Procedure:  MEDIASTINOSCOPY;  Surgeon: Loreli SlotSteven C Hendrickson, MD;  [facility];  Service: Thoracic;  Laterality: N/A;  . VAGINAL HYSTERECTOMY N/A 10/10/2016   Procedure: HYSTERECTOMY VAGINAL;  Surgeon: Christeen DouglasBeasley, Bethany, MD;  Location: [facility] ORS;  Service: Gynecology;  Laterality: N/A;  . VIDEO BRONCHOSCOPY WITH ENDOBRONCHIAL NAVIGATION N/A 06/05/2015   Procedure: VIDEO BRONCHOSCOPY WITH ENDOBRONCHIAL NAVIGATION;  Surgeon: Loreli SlotSteven C Hendrickson, MD;  [facility];  Service: Thoracic;  Laterality: N/A;  . VIDEO BRONCHOSCOPY WITH ENDOBRONCHIAL ULTRASOUND N/A 06/05/2015   Procedure: VIDEO BRONCHOSCOPY WITH ENDOBRONCHIAL ULTRASOUND;  Surgeon: Loreli SlotSteven C Hendrickson, MD;  Location: [facility] OR;  Service: Thoracic;  Laterality: N/A;    Social History   Socioeconomic History  . Marital status: Divorced    Spouse name: Not on file  . Number of children: 3  . Years of education: Not on file  . Highest education level: Not on file  Occupational History  . Occupation: Engineer, petroleumalon coordinator  Social Needs  . Financial resource strain: Not on file  . Food insecurity    Worry: Not on file    Inability: Not on file  . Transportation needs    Medical: Not on file  Non-medical: Not on file  Tobacco Use  . Smoking status: Former Smoker    Packs/day: 1.00    Years: 5.00    Pack years: 5.00    Types: Cigarettes    Quit date: 03/07/1998    Years since quitting: 20.5  . Smokeless tobacco: Never Used  Substance and Sexual Activity  . Alcohol use: Yes    Alcohol/week: 2.0 standard drinks    Types: 2 Standard drinks or equivalent per week    Comment: soical  . Drug use: No  . Sexual activity:  Yes    Partners: Male    Birth control/protection: Surgical    Comment: Vasectomy  Lifestyle  . Physical activity    Days per week: Not on file    Minutes per session: Not on file  . Stress: Not on file  Relationships  . Social Musicianconnections    Talks on phone: Not on file    Gets together: Not on file    Attends religious service: Not on file    Active member of club or organization: Not on file    Attends meetings of clubs or organizations: Not on file    Relationship status: Not on file  . Intimate partner violence    Fear of current or ex partner: Not on file    Emotionally abused: Not on file    Physically abused: Not on file    Forced sexual activity: Not on file  Other Topics Concern  . Not on file  Social History Narrative   Works as Engineer, petroleumsalon coordinator    Children: All boys, 16, 12, 6   Kay and Onalee HuaDavid Hunter's daughter    Family History  Problem Relation Age of Onset  . Arthritis Mother   . Hyperlipidemia Mother   . Hyperlipidemia Father   . Diabetes Father   . Breast cancer Maternal Grandmother   . Hypertension Maternal Grandmother   . Colon cancer Maternal Grandfather   . Hypertension Maternal Grandfather   . Heart disease Paternal Grandfather     Allergies  Allergen Reactions  . Codeine Other (See Comments)    Other reaction(s): Hallucination  . Albuterol     Pt feels shaky.   Truman Hayward. Omnicef [Cefdinir] Diarrhea    Medication list reviewed and updated in full in [facility].   GEN: No acute illnesses, no fevers, chills. GI: No n/v/d, eating normally Pulm: No SOB Interactive and getting along well at home.  Otherwise, ROS is as per the HPI.  Objective:   Pulse 78   Temp 98.1 F (36.7 C) (Oral)   Ht 5' 3.5" (1.613 m)   Wt 152 lb (68.9 kg)   LMP 09/27/2016 (Exact Date)   SpO2 97%   BMI 26.50 kg/m   GEN: WDWN, NAD, Non-toxic, A & O x 3 HEENT: Atraumatic, Normocephalic. Neck supple. No masses, No LAD. Ears and Nose: No external deformity.  CV: RRR, No M/G/R. No JVD. No thrill. No extra heart sounds. PULM: CTA B, no wheezes, crackles, rhonchi. No retractions. No resp. distress. No accessory muscle use. EXTR: No c/c/e NEURO Normal gait.  PSYCH: Normally interactive. Conversant. Not depressed or anxious appearing.  Calm demeanor.   Laboratory and Imaging Data:  Assessment and Plan:     ICD-10-CM   1. Other fatigue  R53.83 Basic metabolic panel    Vitamin B12    CBC with Differential/Platelet    Hepatic function panel    VITAMIN D 25 Hydroxy (Vit-D Deficiency, Fractures)  TSH    T3, free    T4, free    IBC + Ferritin  2. Pulmonary sarcoidosis (HCC)  D86.0    Longstanding fatigue of unclear origin.  Will begin basic work-up as above.  Follow-up: No follow-ups on file.  No orders of the defined types were placed in this encounter.  Orders Placed This Encounter  Procedures  . Basic metabolic panel  . Vitamin B12  . CBC with Differential/Platelet  . Hepatic function panel  . VITAMIN D 25 Hydroxy (Vit-D Deficiency, Fractures)  . TSH  . T3, free  . T4, free  . IBC + Ferritin    Signed,  Shawnya Mayor T. Wasyl Dornfeld, MD   Outpatient Encounter Medications as of 09/05/2018  Medication Sig  . acetaminophen (TYLENOL) 325 MG tablet Take 650 mg by mouth every 6 (six) hours as needed for mild pain or headache.   . beclomethasone (QVAR REDIHALER) 40 MCG/ACT inhaler Inhale 1 puff into the lungs 2 (two) times daily.  . traMADol (ULTRAM) 50 MG tablet Take 1 tablet (50 mg total) by mouth every 6 (six) hours as needed.   No facility-administered encounter medications on file as of 09/05/2018.

## 2018-09-05 ENCOUNTER — Ambulatory Visit (INDEPENDENT_AMBULATORY_CARE_PROVIDER_SITE_OTHER): Payer: Managed Care, Other (non HMO) | Admitting: Family Medicine

## 2018-09-05 ENCOUNTER — Other Ambulatory Visit: Payer: Self-pay

## 2018-09-05 ENCOUNTER — Encounter: Payer: Self-pay | Admitting: Family Medicine

## 2018-09-05 VITALS — HR 78 | Temp 98.1°F | Ht 63.5 in | Wt 152.0 lb

## 2018-09-05 DIAGNOSIS — D860 Sarcoidosis of lung: Secondary | ICD-10-CM

## 2018-09-05 DIAGNOSIS — R5383 Other fatigue: Secondary | ICD-10-CM | POA: Diagnosis not present

## 2018-09-05 LAB — CBC WITH DIFFERENTIAL/PLATELET
Basophils Absolute: 0 10*3/uL (ref 0.0–0.1)
Basophils Relative: 0.7 % (ref 0.0–3.0)
Eosinophils Absolute: 0.1 10*3/uL (ref 0.0–0.7)
Eosinophils Relative: 1.6 % (ref 0.0–5.0)
HCT: 41 % (ref 36.0–46.0)
Hemoglobin: 13.9 g/dL (ref 12.0–15.0)
Lymphocytes Relative: 18 % (ref 12.0–46.0)
Lymphs Abs: 0.6 10*3/uL — ABNORMAL LOW (ref 0.7–4.0)
MCHC: 34 g/dL (ref 30.0–36.0)
MCV: 87.6 fl (ref 78.0–100.0)
Monocytes Absolute: 0.4 10*3/uL (ref 0.1–1.0)
Monocytes Relative: 12.6 % — ABNORMAL HIGH (ref 3.0–12.0)
Neutro Abs: 2.2 10*3/uL (ref 1.4–7.7)
Neutrophils Relative %: 67.1 % (ref 43.0–77.0)
Platelets: 220 10*3/uL (ref 150.0–400.0)
RBC: 4.68 Mil/uL (ref 3.87–5.11)
RDW: 12.8 % (ref 11.5–15.5)
WBC: 3.3 10*3/uL — ABNORMAL LOW (ref 4.0–10.5)

## 2018-09-05 LAB — BASIC METABOLIC PANEL
BUN: 14 mg/dL (ref 6–23)
CO2: 29 mEq/L (ref 19–32)
Calcium: 9.5 mg/dL (ref 8.4–10.5)
Chloride: 102 mEq/L (ref 96–112)
Creatinine, Ser: 1.04 mg/dL (ref 0.40–1.20)
GFR: 58.83 mL/min — ABNORMAL LOW (ref >60.00)
Glucose, Bld: 91 mg/dL (ref 70–99)
Potassium: 4.4 mEq/L (ref 3.5–5.1)
Sodium: 138 mEq/L (ref 135–145)

## 2018-09-05 LAB — HEPATIC FUNCTION PANEL
ALT: 15 U/L (ref 0–35)
AST: 15 U/L (ref 0–37)
Albumin: 4.6 g/dL (ref 3.5–5.2)
Alkaline Phosphatase: 41 U/L (ref 39–117)
Bilirubin, Direct: 0.1 mg/dL (ref 0.0–0.3)
Total Bilirubin: 0.7 mg/dL (ref 0.2–1.2)
Total Protein: 7.4 g/dL (ref 6.0–8.3)

## 2018-09-05 LAB — TSH: TSH: 0.75 u[IU]/mL (ref 0.35–4.50)

## 2018-09-05 LAB — VITAMIN D 25 HYDROXY (VIT D DEFICIENCY, FRACTURES): VITD: 30.14 ng/mL (ref 30.00–100.00)

## 2018-09-05 LAB — T3, FREE: T3, Free: 3.6 pg/mL (ref 2.3–4.2)

## 2018-09-05 LAB — IBC + FERRITIN
Ferritin: 45.9 ng/mL (ref 10.0–291.0)
Iron: 47 ug/dL (ref 42–145)
Saturation Ratios: 13.9 % — ABNORMAL LOW (ref 20.0–50.0)
Transferrin: 242 mg/dL (ref 212.0–360.0)

## 2018-09-05 LAB — T4, FREE: Free T4: 0.95 ng/dL (ref 0.60–1.60)

## 2018-09-05 LAB — VITAMIN B12: Vitamin B-12: 230 pg/mL (ref 211–911)

## 2018-09-10 ENCOUNTER — Encounter: Payer: Self-pay | Admitting: Family Medicine

## 2019-01-16 ENCOUNTER — Other Ambulatory Visit: Payer: Self-pay | Admitting: Obstetrics and Gynecology

## 2019-01-16 DIAGNOSIS — Z1231 Encounter for screening mammogram for malignant neoplasm of breast: Secondary | ICD-10-CM

## 2019-07-19 ENCOUNTER — Other Ambulatory Visit: Payer: Self-pay

## 2019-07-19 ENCOUNTER — Ambulatory Visit: Payer: Managed Care, Other (non HMO) | Attending: Internal Medicine

## 2019-07-19 DIAGNOSIS — Z23 Encounter for immunization: Secondary | ICD-10-CM

## 2019-07-19 NOTE — Progress Notes (Signed)
   Covid-19 Vaccination Clinic  Name:  Madison Holt    MRN: [id number] DOB: [birth year]  07/19/2019  [name] was observed post Covid-19 immunization for 15 minutes without incident. She was provided with Vaccine Information Sheet and instruction to access the V-Safe system.   [name] was instructed to call 911 with any severe reactions post vaccine: Marland Kitchen Difficulty breathing  . Swelling of face and throat  . A fast heartbeat  . A bad rash all over body  . Dizziness and weakness   Immunizations Administered    Name Date Dose VIS Date Route   Pfizer COVID-19 Vaccine 07/19/2019 11:33 AM 0.3 mL 05/01/2018 Intramuscular   Manufacturer: [employer], [employer]   Lot: [id number]   NDC: [address]-7

## 2019-07-24 ENCOUNTER — Ambulatory Visit: Payer: Managed Care, Other (non HMO) | Admitting: Family Medicine

## 2019-07-24 ENCOUNTER — Other Ambulatory Visit: Payer: Self-pay

## 2019-07-24 ENCOUNTER — Encounter: Payer: Self-pay | Admitting: Family Medicine

## 2019-07-24 ENCOUNTER — Other Ambulatory Visit (INDEPENDENT_AMBULATORY_CARE_PROVIDER_SITE_OTHER): Payer: Managed Care, Other (non HMO)

## 2019-07-24 VITALS — BP 110/80 | HR 81 | Temp 98.2°F | Ht 63.5 in | Wt 155.5 lb

## 2019-07-24 DIAGNOSIS — E538 Deficiency of other specified B group vitamins: Secondary | ICD-10-CM | POA: Diagnosis not present

## 2019-07-24 DIAGNOSIS — R5383 Other fatigue: Secondary | ICD-10-CM | POA: Diagnosis not present

## 2019-07-24 DIAGNOSIS — E559 Vitamin D deficiency, unspecified: Secondary | ICD-10-CM | POA: Diagnosis not present

## 2019-07-24 DIAGNOSIS — Z13220 Encounter for screening for lipoid disorders: Secondary | ICD-10-CM

## 2019-07-24 DIAGNOSIS — R7309 Other abnormal glucose: Secondary | ICD-10-CM

## 2019-07-24 DIAGNOSIS — Z131 Encounter for screening for diabetes mellitus: Secondary | ICD-10-CM | POA: Diagnosis not present

## 2019-07-24 DIAGNOSIS — Z72820 Sleep deprivation: Secondary | ICD-10-CM

## 2019-07-24 LAB — CBC WITH DIFFERENTIAL/PLATELET
Basophils Absolute: 0 10*3/uL (ref 0.0–0.1)
Basophils Relative: 0.4 % (ref 0.0–3.0)
Eosinophils Absolute: 0.1 10*3/uL (ref 0.0–0.7)
Eosinophils Relative: 1.2 % (ref 0.0–5.0)
HCT: 39.5 % (ref 36.0–46.0)
Hemoglobin: 13.4 g/dL (ref 12.0–15.0)
Lymphocytes Relative: 12.6 % (ref 12.0–46.0)
Lymphs Abs: 0.5 10*3/uL — ABNORMAL LOW (ref 0.7–4.0)
MCHC: 34 g/dL (ref 30.0–36.0)
MCV: 84 fl (ref 78.0–100.0)
Monocytes Absolute: 0.5 10*3/uL (ref 0.1–1.0)
Monocytes Relative: 11.6 % (ref 3.0–12.0)
Neutro Abs: 3.2 10*3/uL (ref 1.4–7.7)
Neutrophils Relative %: 74.2 % (ref 43.0–77.0)
Platelets: 184 10*3/uL (ref 150.0–400.0)
RBC: 4.7 Mil/uL (ref 3.87–5.11)
RDW: 14.5 % (ref 11.5–15.5)
WBC: 4.3 10*3/uL (ref 4.0–10.5)

## 2019-07-24 LAB — LIPID PANEL
Cholesterol: 164 mg/dL (ref 0–200)
HDL: 52.6 mg/dL (ref >39.00)
LDL Cholesterol: 94 mg/dL (ref 0–99)
NonHDL: 111.32
Total CHOL/HDL Ratio: 3
Triglycerides: 87 mg/dL (ref 0.0–149.0)
VLDL: 17.4 mg/dL (ref 0.0–40.0)

## 2019-07-24 LAB — BASIC METABOLIC PANEL
BUN: 15 mg/dL (ref 6–23)
CO2: 28 mEq/L (ref 19–32)
Calcium: 9.1 mg/dL (ref 8.4–10.5)
Chloride: 102 mEq/L (ref 96–112)
Creatinine, Ser: 0.95 mg/dL (ref 0.40–1.20)
GFR: 65.02 mL/min (ref >60.00)
Glucose, Bld: 114 mg/dL — ABNORMAL HIGH (ref 70–99)
Potassium: 4.6 mEq/L (ref 3.5–5.1)
Sodium: 135 mEq/L (ref 135–145)

## 2019-07-24 LAB — VITAMIN D 25 HYDROXY (VIT D DEFICIENCY, FRACTURES): VITD: 25.65 ng/mL — ABNORMAL LOW (ref 30.00–100.00)

## 2019-07-24 LAB — HEPATIC FUNCTION PANEL
ALT: 19 U/L (ref 0–35)
AST: 21 U/L (ref 0–37)
Albumin: 4 g/dL (ref 3.5–5.2)
Alkaline Phosphatase: 50 U/L (ref 39–117)
Bilirubin, Direct: 0.1 mg/dL (ref 0.0–0.3)
Total Bilirubin: 0.5 mg/dL (ref 0.2–1.2)
Total Protein: 7.2 g/dL (ref 6.0–8.3)

## 2019-07-24 LAB — VITAMIN B12: Vitamin B-12: 258 pg/mL (ref 211–911)

## 2019-07-24 MED ORDER — TRAZODONE HCL 50 MG PO TABS: 25.0000 mg | ORAL_TABLET | Freq: Every evening | ORAL | 3 refills | Status: DC | PRN

## 2019-07-24 NOTE — Patient Instructions (Signed)
We will let you know about your bloodwork when it comes back.

## 2019-07-24 NOTE — Progress Notes (Signed)
Braleigh Massoud T. Aidyn Sportsman, MD, [place] at [facility] [place], [address]  Phone: [phone]  FAX: [phone]  Madison Holt - 39 y.o. female  MRN [id number]  Date of Birth: 09-22-1980  Date: 07/24/2019  PCP: Owens Loffler, MD  Referral: Owens Loffler, MD  Chief Complaint  Patient presents with  . Fatigue    Wants to discuss B12 injections    This visit occurred during the SARS-CoV-2 public health emergency.  Safety protocols were in place, including screening questions prior to the visit, additional usage of staff PPE, and extensive cleaning of exam room while observing appropriate contact time as indicated for disinfecting solutions.   Subjective:   Madison Holt is a 39 y.o. very pleasant female patient with Body mass index is 27.11 kg/m. who presents with the following:  She is a pleasant lady with pulmonary sarcoidosis, which has been plagued with some fatigue for quite some time.  We have worked this up in the past very extensively and she had some borderline low B12 levels as well as low vitamin D levels, normal thyroid levels and otherwise his work-up has been negative.  She does sleep only 3 to 4 hours per report, and she does not snore significantly or stop breathing per her partner.  Fatigue: Sarcoid, some difficulty with breathing.  ? Cannot take a deep breath.  Walk with 02.  B12? ?  Stopped oral b12, august 2020  Vit D,  Vit D supp did not help.  Sleeping only 3-4 hours a night.     Wt Readings from Last 3 Encounters:  07/24/19 155 lb 8 oz (70.5 kg)  09/05/18 152 lb (68.9 kg)  04/11/18 145 lb 6.4 oz (66 kg)      Review of Systems is noted in the HPI, as appropriate  Objective:   BP 110/80   Pulse 81   Temp 98.2 F (36.8 C) (Temporal)   Ht 5' 3.5" (1.613 m)   Wt 155 lb 8 oz (70.5 kg)   LMP 09/27/2016 (Exact Date)   SpO2 98%   BMI  27.11 kg/m   GEN: No acute distress; alert,appropriate. PULM: Breathing comfortably in no respiratory distress CV: RRR, no m/g/r  PULM: Normal respiratory rate, no accessory muscle use. No wheezes, crackles or rhonchi  PSYCH: Normally interactive.   Laboratory and Imaging Data: Results for orders placed or performed in visit on 32/20/25  Basic metabolic panel  Result Value Ref Range   Sodium 138 135 - 145 mEq/L   Potassium 4.4 3.5 - 5.1 mEq/L   Chloride 102 96 - 112 mEq/L   CO2 29 19 - 32 mEq/L   Glucose, Bld 91 70 - 99 mg/dL   BUN 14 6 - 23 mg/dL   Creatinine, Ser 1.04 0.40 - 1.20 mg/dL   Calcium 9.5 8.4 - 10.5 mg/dL   GFR 58.83 (L) >60.00 mL/min  Vitamin B12  Result Value Ref Range   Vitamin B-12 230 211 - 911 pg/mL  CBC with Differential/Platelet  Result Value Ref Range   WBC 3.3 (L) 4.0 - 10.5 K/uL   RBC 4.68 3.87 - 5.11 Mil/uL   Hemoglobin 13.9 12.0 - 15.0 g/dL   HCT 41.0 36.0 - 46.0 %   MCV 87.6 78.0 - 100.0 fl   MCHC 34.0 30.0 - 36.0 g/dL   RDW 12.8 11.5 - 15.5 %   Platelets 220.0 150.0 - 400.0 K/uL  Neutrophils Relative % 67.1 43.0 - 77.0 %   Lymphocytes Relative 18.0 12.0 - 46.0 %   Monocytes Relative 12.6 (H) 3.0 - 12.0 %   Eosinophils Relative 1.6 0.0 - 5.0 %   Basophils Relative 0.7 0.0 - 3.0 %   Neutro Abs 2.2 1.4 - 7.7 K/uL   Lymphs Abs 0.6 (L) 0.7 - 4.0 K/uL   Monocytes Absolute 0.4 0.1 - 1.0 K/uL   Eosinophils Absolute 0.1 0.0 - 0.7 K/uL   Basophils Absolute 0.0 0.0 - 0.1 K/uL  Hepatic function panel  Result Value Ref Range   Total Bilirubin 0.7 0.2 - 1.2 mg/dL   Bilirubin, Direct 0.1 0.0 - 0.3 mg/dL   Alkaline Phosphatase 41 39 - 117 U/L   AST 15 0 - 37 U/L   ALT 15 0 - 35 U/L   Total Protein 7.4 6.0 - 8.3 g/dL   Albumin 4.6 3.5 - 5.2 g/dL  VITAMIN D 25 Hydroxy (Vit-D Deficiency, Fractures)  Result Value Ref Range   VITD 30.14 30.00 - 100.00 ng/mL  TSH  Result Value Ref Range   TSH 0.75 0.35 - 4.50 uIU/mL  T3, free  Result Value Ref Range    T3, Free 3.6 2.3 - 4.2 pg/mL  T4, free  Result Value Ref Range   Free T4 0.95 0.60 - 1.60 ng/dL  IBC + Ferritin  Result Value Ref Range   Iron 47 42 - 145 ug/dL   Transferrin 532.9 924.2 - 360.0 mg/dL   Saturation Ratios 68.3 (L) 20.0 - 50.0 %   Ferritin 45.9 10.0 - 291.0 ng/mL     Assessment and Plan:     ICD-10-CM   1. Other fatigue  R53.83 CBC with Differential/Platelet    Hepatic function panel  2. Poor sleep  Z72.820   3. B12 deficiency  E53.8 Vitamin B12  4. Vitamin D deficiency  E55.9 VITAMIN D 25 Hydroxy (Vit-D Deficiency, Fractures)  5. Screening, lipid  Z13.220 Lipid panel  6. Screening for diabetes mellitus  Z13.1 Basic metabolic panel   Fatigue of unclear origin.  I tend to think that this is primarily from lack of sleep.  Check the patient on trazodone to see if this helps.  Follow-up: No follow-ups on file.  No orders of the defined types were placed in this encounter.  There are no discontinued medications. Orders Placed This Encounter  Procedures  . Vitamin B12  . Basic metabolic panel  . CBC with Differential/Platelet  . Hepatic function panel  . VITAMIN D 25 Hydroxy (Vit-D Deficiency, Fractures)  . Lipid panel    Signed,  Karleen Hampshire T. Kmya Placide, MD   Outpatient Encounter Medications as of 07/24/2019  Medication Sig  . acetaminophen (TYLENOL) 325 MG tablet Take 650 mg by mouth every 6 (six) hours as needed for mild pain or headache.   . beclomethasone (QVAR REDIHALER) 40 MCG/ACT inhaler Inhale 1 puff into the lungs 2 (two) times daily.  . traMADol (ULTRAM) 50 MG tablet Take 1 tablet (50 mg total) by mouth every 6 (six) hours as needed.   No facility-administered encounter medications on file as of 07/24/2019.

## 2019-07-25 LAB — HEMOGLOBIN A1C: Hgb A1c MFr Bld: 4.8 % (ref 4.6–6.5)

## 2019-08-13 ENCOUNTER — Ambulatory Visit: Payer: Managed Care, Other (non HMO) | Attending: Internal Medicine

## 2019-08-13 DIAGNOSIS — Z23 Encounter for immunization: Secondary | ICD-10-CM

## 2019-08-13 NOTE — Progress Notes (Signed)
   Covid-19 Vaccination Clinic  Name:  Madison Holt    MRN: [id number] DOB: 1980/11/13  08/13/2019  [name] was observed post Covid-19 immunization for 15 minutes without incident. She was provided with Vaccine Information Sheet and instruction to access the V-Safe system.   [name] was instructed to call 911 with any severe reactions post vaccine: Marland Kitchen Difficulty breathing  . Swelling of face and throat  . A fast heartbeat  . A bad rash all over body  . Dizziness and weakness   Immunizations Administered    Name Date Dose VIS Date Route   Pfizer COVID-19 Vaccine 08/13/2019  8:10 AM 0.3 mL 05/01/2018 Intramuscular   Manufacturer: [employer], [employer]   Lot: [id number]   NDC: [address]-9

## 2019-08-15 ENCOUNTER — Other Ambulatory Visit: Payer: Self-pay | Admitting: Family Medicine

## 2020-01-20 ENCOUNTER — Other Ambulatory Visit: Payer: Self-pay | Admitting: Obstetrics and Gynecology

## 2020-01-20 DIAGNOSIS — Z1231 Encounter for screening mammogram for malignant neoplasm of breast: Secondary | ICD-10-CM

## 2020-04-21 ENCOUNTER — Other Ambulatory Visit
Admission: RE | Admit: 2020-04-21 | Discharge: 2020-04-21 | Disposition: A | Discharge status: Home / Self Care | Payer: Managed Care, Other (non HMO) | Source: Ambulatory Visit | Attending: Pediatrics | Admitting: Pediatrics

## 2020-04-21 ENCOUNTER — Other Ambulatory Visit: Payer: Self-pay

## 2020-04-21 ENCOUNTER — Ambulatory Visit
Admission: RE | Admit: 2020-04-21 | Discharge: 2020-04-21 | Disposition: A | Discharge status: Home / Self Care | Payer: Managed Care, Other (non HMO) | Source: Ambulatory Visit | Attending: Student | Admitting: Student

## 2020-04-21 ENCOUNTER — Other Ambulatory Visit: Payer: Self-pay | Admitting: Student

## 2020-04-21 DIAGNOSIS — R7989 Other specified abnormal findings of blood chemistry: Secondary | ICD-10-CM

## 2020-04-21 DIAGNOSIS — M546 Pain in thoracic spine: Secondary | ICD-10-CM

## 2020-04-21 LAB — FIBRIN DERIVATIVES D-DIMER (ARMC ONLY): Fibrin derivatives D-dimer (ARMC): 733.37 ng/mL (FEU) — ABNORMAL HIGH (ref 0.00–499.00)

## 2020-04-21 MED ORDER — IOHEXOL 350 MG/ML SOLN
75.0000 mL | Freq: Once | INTRAVENOUS | Status: AC | PRN
  Administered 2020-04-21: 75 mL via INTRAVENOUS

## 2020-06-02 ENCOUNTER — Other Ambulatory Visit: Payer: Self-pay

## 2020-06-02 ENCOUNTER — Encounter: Payer: Self-pay | Admitting: Internal Medicine

## 2020-06-02 ENCOUNTER — Ambulatory Visit: Payer: Managed Care, Other (non HMO) | Admitting: Internal Medicine

## 2020-06-02 VITALS — BP 106/62 | HR 82 | Temp 97.8°F | Ht 63.0 in | Wt 144.2 lb

## 2020-06-02 DIAGNOSIS — R7989 Other specified abnormal findings of blood chemistry: Secondary | ICD-10-CM

## 2020-06-02 DIAGNOSIS — D860 Sarcoidosis of lung: Secondary | ICD-10-CM

## 2020-06-02 DIAGNOSIS — B349 Viral infection, unspecified: Secondary | ICD-10-CM

## 2020-06-02 NOTE — Patient Instructions (Addendum)
Pulmonary sarcoidosis (HCC)  -Clinically stable on CT scan of the chest and resolution of splenic lesions from 2 years ago. -Subjectively also stable with mild levels of random shortness of breath and fatigue  Plan -Continue supportive care -Do full pulmonary function test in 1 year  Positive D dimer February 2022 Acute viral disease   -Glad pulmonary embolism was ruled out.  Positive D-dimer likely related to acute illness.  No evidence of blood clot anywhere in the system or any systemic active disease at this moment.  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

## 2020-06-02 NOTE — Progress Notes (Signed)
40 yo Female former smoker seen for pulmonary consult 05/10/2005 by Dr. Marchelle Gearingamaswamy for shortness of breath and cough.  TEST Pulmonary function tests of 05/04/2015 normal  Autoimmune workup 05/06/2015 extensive panel is essentially normal except angiotensin-converting enzyme is elevated at 60 and c-ANCA is trace positive at 1:40 but PR 3 and MPO antibodies are negative.  05/06/2015 she also had CT chest with contrast and high resolution CT chest without contrast: There classic pulmonary infiltrates and hilar and subcarinal lymphadenopathy. Overall pattern is consistent with sarcoidosis radiologically according to the radiologist. I personally visualized this film.   Walking desat test 185 feet X 3 laps on RA -> did not desatrurate   06/29/2015 Follow up : Bx results Patient returns for a follow-up. Patient was seen last month for pulmonary consult for shortness of breath and cough and suspected sarcoid. Patient had been having cough, shortness of breath. Pulmonary function test was normal. An autoimmune panel in March was essentially negative except for cANCA that was trace positive. Ace level was elevated (PR-3 and MPO negative). CT chest showed pulmonary infiltrates and hilar and subcarinal lymphadenopathy. Patient was sent for a surgical consult. Patient underwent a bronchoscopy, endobronchial ultrasound and mediastinoscopy on 06/05/15 . Surgical pathology came back for nonnecrotizing granuloma and hyaline fibrosis in the lymph nodes. Lung biopsy showed no granulomas or malignancy. [facility] for acute illness with chills and severe nausea. She was started on prednisone 40 mg. She says that her acute symptoms have resolved. She has not seen any change in her breathing. She continues to have fatigue, cough. She denies any rash, chest pain, orthopnea, PND, hemoptysis, orthopnea. She says that she has gained some weight since starting prednisone. We discussed  healthy dietary choices and exercise. She says that she does have some visual changes. Over the last year. Recommend that she make an appointment with ophthalmology for a annual exam and that she will need annual eye exams yearly. She is currently on prednisone 40 mg daily   REC pred and taper and hold at 10mg  per day  OV 08/10/2015  Chief Complaint  Patient presents with  . Follow-up    Pt c/o occasional dry cough and SOB/chest tightness with exertion. Pt denies wheeze/CP. Pt states that her breathing is improving. She has noticed that weather does affect her breathing, mostly with extremes in air temperature and humidity.      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 At that point in time she already been on a few weeks of prednisone at 40 mg per day. At that visit 06/29/2015 she was better. She was asked to taper her prednisone and continue at 10 mg per day. However she ran out of the bottle and she stopped taking the prednisone one week ago. However it turns out that she is reluctant to take prednisone because of weight gain. She says she has gained 30 pounds of weight which is more than her pregnancy weight gain in the past. She does admit that prednisone helped her a lot. While shortness of breath cough chest tightness was all improved with prednisone significantly but now off prednisone for weeks symptoms are returning. The heat and humidity do bother her more significantly. Per hx - eeye exam - no sarcoid  Wondering about measures she could' do to coutneract weight gain   OV 11/16/2015  Chief Complaint  Patient presents with  . Follow-up  been off steriods for x 1 month and more sob faster, dry cough, chest pain and some tightness   Follow-up stage II pulmonary sarcoidosis-diagnosis established March 2017  Last visit June 2017. We reinitiated prednisone. However she went to the beach and forgot to take  prednisone. She has not taken prednisone in over a month. With this she says that her mood swings are better. She still has some sleep issues. Being off prednisone initially she was fine but now she feels the shortness of breath and cough are coming back. She struggling between managing the side effects of prednisone and the symptoms of sarcoidosis. Last chest x-ray 06/05/2015 at the time diagnosis was made. Last pulmonary function test was in February 2017 for diagnosis was made. 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   OV 06/26/2017 Chief Complaint  Patient presents with  . Follow-up    reports some SOB since weather has changed, overall states she has been okay.    40 year old female with stage II sarcoidosis on observation therapy  Presents for routine follow-up.  It has been over a year since I last saw her.  Overall in the past year she has been stable.  She continues to work Psychologist, sport and exercise at medical clinic.  She continues to feel shortness of breath on talking.  She feels like she cannot to get a good deep breath.  Long-acting beta agonist made her tachycardic.  Prednisone made her gain weight and since being off prednisone she has not lost that weight.  Prednisone also made her mood changes and these have improved/resolved after stopping prednisone.  She gets easily dyspneic on minimal exertion.  She says she is to be an athlete but is unable to do these activities.  She just feels like she needs to get a good deep breath.  She is open to the idea of having pulmonary stress test.  A cardiac stress test a few years ago apparently was normal.  She does not have any other clinical symptoms of sarcoidosis and she feels a sarcoidosis under control.  She is not keen on repeating a chest x-ray or having pulmonary function test.  Socially she has been busy getting her son into college and a son in high school and now  the son 57 years old    OV 12/15/2017  Subjective:  Patient ID: Madison Holt, female , DOB: 07-03-1980 , age 40 y.o. , MRN: [id number] , ADDRESS: [address] [name] [facility] [address]   12/15/2017 -   Chief Complaint  Patient presents with  . Follow-up    Did not do the cpst due to cost breathing is about the same.     HPI Madison Holt 40 y.o. 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  She is here for six-month follow-up.  She continues to have mild intermittent shortness of breath.  This happens episodically.  She takes Qvar and it goes away.  Heat makes it worse.  It almost sounds like asthma.  We recommended a pulmonary stress test but she has deferred it because of a high deductible cost to it.  She has not had a flu shot but will have it with her employer.  There are no other issues.  With the onset of fall she is actually feeling better.  At this point in time she just wants supportive and expectant follow-up.  She is willing to try bronchodilator as long as it does not cause of tremors.  Albuterol causes tremors.  She is open to trying to Xopenex if you have a sample.  She is not sure she wants to afford or pay for Xopenex.  Especially now that she is feeling better.    OV 04/11/2018  Subjective:  Patient ID: Madison Holt, female , DOB: 08/03/1980 , age 22 y.o. , MRN: [id number] , ADDRESS: [address] [name] [facility] [address]   04/11/2018 -   Chief Complaint  Patient presents with  . Results    Chest CT   Stage II pulmonary sarcoidosis on observation therapy  HPI Madison Holt 40 y.o. -presents for follow-up.  At this point in time she feels stable with her baseline exertional dyspnea and fatigue.  There are no new issues other than the fact earlier in January 2020 she had respiratory infection.  With doxycycline and prednisone it improved and she is back to baseline.  Of note she did get Omnicef and discussed diarrhea and she had to stop.  She  had a chest x-ray during this time and there was concern for pulmonary infiltrates.  Therefore because it is been 3 years since we did a CT chest we got a CT chest and she is here to review.  I personally visualized the CT chest.  To me the mediastinal nodes and the pulmonary infiltrates appear stable and in fact the pulmonary infiltrates may be slightly improved.  According to the radiologist the mediastinal nodes are stable but the pulmonary infiltrates some areas have improved in some areas of worse.  We did spirometry today in the office and it shows continued stability in the last 2 years.  Results are below.  She wanted to discuss the implications of this.  She cannot handle methotrexate and prednisone.  Therefore she is averse to taking this.  At this point in time because of good quality of life other than mild shortness of breath and fatigue she is fairly content with observation therapy.  The bigger question is that on the CT chest hypodense lesions were seen in the spleen raising the question of splenic sarcoidosis.  She says she has palpitations for a long time.  She seen cardiology was done echocardiogram.  This was in [place] those records are not available.  The cardiologist at [facility] o clinic.  However she says a Holter monitor has not been done.  She had a recent eye exam which was normal.  However, she is worried about spread of sarcoidosis to the liver, brain and cardiac tissue.  We discussed about possible screening versus and also treatment of the splenic lesions.  We discussed the possibility of a second opinion at [facility] of [place] or [place].  But at this point in time we resolved that we would discuss locally   Results for Madison Holt, Madison Holt (MRN [id number]) as of 04/11/2018 09:03  Ref. Range 05/04/2015 09:59 03/29/2016 15:06 04/11/2018 [facility]  FVC-Pre Latest Units: L 3.86 3.71 3.7  FVC-%Pred-Pre Latest Units: % 103 99 102%  FEV1-Pre Latest Units: L 2.69 2.57 2.5/   FEV1-%Pred-Pre Latest Units: % 87 83 86%   Results for Madison Holt, Madison Holt (MRN [id number]) as of 04/11/2018 09:03  Ref. Range 05/04/2015 09:59 03/29/2016 15:06  DLCO unc Latest Units: ml/min/mmHg 21.37 21.71  DLCO unc % pred Latest Units: % 88 89   CT chest 04/09/2018 IMPRESSION: 1. Mild mediastinal and bilateral hilar lymphadenopathy is stable  to slightly increased since 05/06/2015 CT, compatible with sarcoidosis. 2. Upper lung predominant pulmonary parenchymal findings of sarcoidosis, with mild mixed changes including worsening confluent regions of bandlike and nodular consolidation in both lungs likely reflecting a combination of confluent nodularity and fibrosis, and with some improvement in the fine perilymphatic nodularity previously visualized. 3. Numerous hypodense splenic lesions, not previously discretely visualized (which may be due to technical differences in contrast timing), compatible with sarcoidosis.   Electronically Signed   By: Delbert Phenix M.D.   On: 04/10/2018 09:14  ROS - per HPI   OV 06/02/2020  Subjective:  Patient ID: Madison Holt, female , DOB: November 13, 1980 , age 47 y.o. , MRN: [id number] , ADDRESS: [address] [place] [address] PCP Hannah Beat, MD Patient Care Team: Hannah Beat, MD as PCP - General (Family Medicine)  This Provider for this visit: Treatment Team:  Attending Provider: [name], MD    06/02/2020 -   Chief Complaint  Patient presents with  . Follow-up    Recently had an elevated d dimer and chest ct. Breathing has been doing well.  Breathing with the seasonal allergies and exertion at times but in all she is doing well.    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 -returns for follow-up.  Last seen 2 years ago.  She says overall she is been doing stable except with random episodes of mild shortness of breath and ongoing fatigue.  No real issues.  She  believes in early 2020 she might of had COVID-19 but at that time testing was not available.  She has had 2 shots of the Covid vaccine last 1 in June 2021.  After that she is not actively had COVID.  She continues to work at [employer] clinic in [place].  Then in mid February 2022 she had fever and diarrhea and stomach upset.  She saw outpatient urgent care.  D-dimer was elevated.  She then had CT angiogram chest that ruled out pulmonary embolism.  Of note the splenic lesion she had 2 years ago was resolved.  Her sarcoid lesions itself had improved on the CT.  After this GI symptoms have resolved.  At that time she did not have any Covid exposures.  She is back to baseline but she is concerned why she had a positive D-dimer and what to do about it.  She is here for follow-up.  She is in favor of continued supportive approach with her sarcoid as opposed to immunomodulatory treatment    SYMPTOM SCALE -  06/02/2020   O2 use ra  Shortness of Breath 0 -> 5 scale with 5 being worst (score 6 If unable to do)  At rest 0  Simple tasks - showers, clothes change, eating, shaving 0  Household (dishes, doing bed, laundry) 2  Shopping 0  Walking level at own pace 2  Walking up Stairs 3  Total (30-36) Dyspnea Score 7  How bad is your cough? 0  How bad is your fatigue 5  How bad is nausea 4 - on med  How bad is vomiting?  0  How bad is diarrhea? 3 - on med  How bad is anxiety? 3  How bad is depression 0        CT Chest data - feb 2022  CLINICAL DATA:  Thoracic pain since Sunday, positive D-dimer today. History of shortness of breath and sarcoidosis.  EXAM: CT ANGIOGRAPHY CHEST WITH CONTRAST  TECHNIQUE: Multidetector CT imaging of the chest was performed using the standard protocol during bolus administration of intravenous contrast. Multiplanar CT image reconstructions and MIPs were obtained to evaluate the vascular anatomy.  CONTRAST:  75mL OMNIPAQUE IOHEXOL 350 MG/ML  SOLN  COMPARISON:  Chest CT April 09, 2018.  FINDINGS: Cardiovascular: Satisfactory opacification of the pulmonary arteries to the segmental level. No evidence of pulmonary embolism. Normal heart size. No pericardial effusion.  Mediastinum/Nodes: Slightly decreased size of the borderline enlarged/prominent mediastinal and hilar lymph nodes. Index lymph nodes are as follows, right paratracheal lymph node now measures 8 mm previously 12 mm (series 7, image 30), right subcarinal lymph node now measures 1.3 cm previously 1.6 cm (series 7, image 67), and the right hilar lymph node now measures 1.1 cm previously 1.3 cm (series 7, image 55). No axillary adenopathy. Again seen are multiple small hypodense thyroid nodules largest of which measures 1 cm.  Lungs/Pleura: No pneumothorax. No pleural effusion. No significant change in the patchy bilateral upper lobe predominant perilymphatic nodularity, which appears similar to prior study. Similar to slightly decreased bandlike and nodular foci of consolidation in bilateral lungs with associated mild volume loss and distortion, for instance in the posterior right upper lobe now measures 3.8 x 1.1 cm previously 4.5 x 1.7 cm (series 9, image 43), a right lower lobe pulmonary nodule which measures 6 mm, unchanged (series 9, image 65), and a left upper lobe nodule which now measures 5 mm previously 6 mm (series 9, image 62). No acute consolidative airspace opacities. No significant regions of traction bronchiectasis or frank honeycombing.  Upper Abdomen: Previously described hypodense lesions scattered throughout the spleen are not visualized today.  Musculoskeletal: No aggressive lytic or blastic lesion of bone.  Review of the MIP images confirms the above findings.  IMPRESSION: 1. No evidence of pulmonary embolism. 2. No significant change in the patchy bilateral upper lobe predominant perilymphatic nodularity and foci of  bandlike and nodular foci of consolidation in bilateral lungs with associated mild volume loss and distortion, compatible with known sarcoidosis. 3. Slightly decreased size of the borderline enlarged/prominent mediastinal and hilar lymph nodes, also compatible with known sarcoidosis   Electronically Signed   By: Maudry Mayhew MD   On: 04/21/2020 15:46    PFT  PFT Results Latest Ref Rng & Units 03/29/2016 05/04/2015  FVC-Pre L 3.71 3.86  FVC-Predicted Pre % 99 103  FVC-Post L 3.78 3.90  FVC-Predicted Post % 101 104  Pre FEV1/FVC % % 69 70  Post FEV1/FCV % % 75 76  FEV1-Pre L 2.57 2.69  FEV1-Predicted Pre % 83 87  FEV1-Post L 2.82 2.95  DLCO uncorrected ml/min/mmHg 21.71 21.37  DLCO UNC% % 89 88  DLCO corrected ml/min/mmHg 20.49 -  DLCO COR %Predicted % 84 -  DLVA Predicted % 99 93  TLC L 5.19 5.21  TLC % Predicted % 102 103  RV % Predicted % 105 100       has a past medical history of Pneumonia (2014), Sarcoidosis, and Shortness of breath dyspnea.   reports that she quit smoking about 22 years ago. Her smoking use included cigarettes. She has a 5.00 pack-year smoking history. She has never used smokeless tobacco.  Past Surgical History:  Procedure Laterality Date  . BILATERAL SALPINGECTOMY Bilateral 10/10/2016   Procedure: BILATERAL SALPINGECTOMY;  Surgeon: Christeen Douglas, MD;  Location: [facility] ORS;  Service: Gynecology;  Laterality: Bilateral;  . ESOPHAGOGASTRODUODENOSCOPY    . MEDIASTINOSCOPY N/A 06/05/2015   Procedure:  MEDIASTINOSCOPY;  Surgeon: Loreli Slot, MD;  Location: [facility] OR;  Service: Thoracic;  Laterality: N/A;  . VAGINAL HYSTERECTOMY N/A 10/10/2016   Procedure: HYSTERECTOMY VAGINAL;  Surgeon: Christeen Douglas, MD;  Location: [facility] ORS;  Service: Gynecology;  Laterality: N/A;  . VIDEO BRONCHOSCOPY WITH ENDOBRONCHIAL NAVIGATION N/A 06/05/2015   Procedure: VIDEO BRONCHOSCOPY WITH ENDOBRONCHIAL NAVIGATION;  Surgeon: Loreli Slot, MD;  Location: [facility]  OR;  Service: Thoracic;  Laterality: N/A;  . VIDEO BRONCHOSCOPY WITH ENDOBRONCHIAL ULTRASOUND N/A 06/05/2015   Procedure: VIDEO BRONCHOSCOPY WITH ENDOBRONCHIAL ULTRASOUND;  Surgeon: Loreli Slot, MD;  Location: [facility] OR;  Service: Thoracic;  Laterality: N/A;    Allergies  Allergen Reactions  . Codeine Other (See Comments)    Other reaction(s): Hallucination  . Albuterol     Pt feels shaky.   Truman Hayward [Cefdinir] Diarrhea    Immunization History  Administered Date(s) Administered  . Influenza,inj,Quad PF,6+ Mos 12/21/2015, 01/03/2018  . PFIZER(Purple Top)SARS-COV-2 Vaccination 07/19/2019, 08/13/2019  . Tdap 05/29/2019    Family History  Problem Relation Age of Onset  . Arthritis Mother   . Hyperlipidemia Mother   . Hyperlipidemia Father   . Diabetes Father   . Breast cancer Maternal Grandmother   . Hypertension Maternal Grandmother   . Colon cancer Maternal Grandfather   . Hypertension Maternal Grandfather   . Heart disease Paternal Grandfather      Current Outpatient Medications:  .  hyoscyamine (LEVSIN SL) 0.125 MG SL tablet, Place under the tongue every 6 (six) hours as needed., Disp: , Rfl:  .  ondansetron (ZOFRAN-ODT) 4 MG disintegrating tablet, Take 4 mg by mouth every 8 (eight) hours as needed., Disp: , Rfl:  .  acetaminophen (TYLENOL) 325 MG tablet, Take 650 mg by mouth every 6 (six) hours as needed for mild pain or headache.  (Patient not taking: Reported on 06/02/2020), Disp: , Rfl:  .  beclomethasone (QVAR REDIHALER) 40 MCG/ACT inhaler, Inhale 1 puff into the lungs 2 (two) times daily. (Patient not taking: Reported on 06/02/2020), Disp: 10.6 g, Rfl: 2 .  traMADol (ULTRAM) 50 MG tablet, Take 1 tablet (50 mg total) by mouth every 6 (six) hours as needed. (Patient not taking: Reported on 06/02/2020), Disp: 40 tablet, Rfl: 1 .  traZODone (DESYREL) 50 MG tablet, TAKE 0.5-1 TABLETS (25-50 MG TOTAL) BY MOUTH AT BEDTIME AS NEEDED FOR SLEEP. (Patient not taking: Reported on  06/02/2020), Disp: 90 tablet, Rfl: 0      Objective:   Vitals:   06/02/20 0926  BP: 106/62  Pulse: 82  Temp: 97.8 F (36.6 C)  TempSrc: Tympanic  SpO2: 100%  Weight: 144 lb 4 oz (65.4 kg)  Height:  (1.6 m)    Estimated body mass index is 25.55 kg/m as calculated from the following:   Height as of this encounter:  (1.6 m).   Weight as of this encounter: 144 lb 4 oz (65.4 kg).  @  [employer]   06/02/20 0926  Weight: 144 lb 4 oz (65.4 kg)     Physical Exam   General: No distress. Looks well Neuro: Alert and Oriented x 3. GCS 15. Speech normal Psych: Pleasant Resp:  Barrel Chest - no.  Wheeze - no, Crackles - no, No overt respiratory distress CVS: Normal heart sounds. Murmurs - no Ext: Stigmata of Connective Tissue Disease - no HEENT: Normal upper airway. PEERL +. No post nasal drip        Assessment:  ICD-10-CM   1. Pulmonary sarcoidosis (HCC)  D86.0   2. Positive D dimer  R79.89   3. Acute viral disease  B34.9        Plan:     Patient Instructions  Pulmonary sarcoidosis (HCC)  -Clinically stable on CT scan of the chest and resolution of splenic lesions from 2 years ago. -Subjectively also stable with mild levels of random shortness of breath and fatigue  Plan -Continue supportive care -Do full pulmonary function test in 1 year  Positive D dimer February 2022 Acute viral disease   -Glad pulmonary embolism was ruled out.  Positive D-dimer likely related to acute illness.  No evidence of blood clot anywhere in the system or any systemic active disease at this moment.  Acute viral illness could have been COVID-19 in February 2022  Plan -Check Covid IgG to get some clarity in February 2022 illness might have been due to COVID-19 [unlikely positive IgG would be from vaccine last dose was in June 2021]  Follow-up -Return in 1 year but after breathing test [[place] [facility]] follow-up stage II pulmonary sarcoidosis on  observation therapy       SIGNATURE    Dr. [name], M.D., F.C.C.P,  Pulmonary and Critical Care Medicine Staff Physician, [facility] Director - Interstitial Lung Disease  Program  Pulmonary Fibrosis [facility] at [facility] [place], [place], [address]  Pager: [phone], If no answer or between  15:00h - 7:00h: call [phone] Telephone: [phone]  10:09 AM 06/02/2020

## 2020-06-02 NOTE — Addendum Note (Signed)
Addended by: Marcellus Scott on: 06/02/2020 10:16 AM   Modules accepted: Orders

## 2020-06-02 NOTE — Addendum Note (Signed)
Addended by: Demetrio Lapping E on: 06/02/2020 10:18 AM   Modules accepted: Orders

## 2020-06-03 LAB — SARS COV-2 SEROLOGY(COVID-19)AB(IGG,IGM),IMMUNOASSAY
SARS CoV-2 AB IgG: NEGATIVE
SARS CoV-2 IgM: NEGATIVE

## 2020-06-11 ENCOUNTER — Telehealth: Payer: Self-pay | Admitting: Internal Medicine

## 2020-06-11 NOTE — Telephone Encounter (Signed)
covid IgG ws negative. This means immediate defesne against covid from 2nd dose has waned off. She can do 3rd shot if she feels inclined. She can also watch covid levels and if rising can do 3rd shot at that time. Certainly if the covid strain changes she needs vaccine against that strain    Immunization History  Administered Date(s) Administered  . Influenza,inj,Quad PF,6+ Mos 12/21/2015, 01/03/2018  . PFIZER(Purple Top)SARS-COV-2 Vaccination 07/19/2019, 08/13/2019  . Tdap 05/29/2019

## 2020-06-12 NOTE — Telephone Encounter (Signed)
Called and spoke with pt letting her know the results of labwork and recs per MR. Pt verbalized understanding. Nothing further needed.

## 2020-06-12 NOTE — Telephone Encounter (Signed)
Attempted to call pt but line went straight to VM. Left message for her to return call. 

## 2020-06-15 MED ORDER — LEVALBUTEROL TARTRATE 45 MCG/ACT IN AERO: 2.0000 | INHALATION_SPRAY | Freq: Three times a day (TID) | RESPIRATORY_TRACT | 5 refills | Status: DC | PRN

## 2020-06-15 MED ORDER — BECLOMETHASONE DIPROPIONATE 80 MCG/ACT IN AERS: 2.0000 | INHALATION_SPRAY | Freq: Two times a day (BID) | RESPIRATORY_TRACT | 5 refills | Status: DC

## 2020-06-15 NOTE — Telephone Encounter (Signed)
Ok to do [employer]  2 puff bid Xopenex HFA 2 puff q8h prn - let her know if this is expensive we can try prior ath

## 2020-06-15 NOTE — Telephone Encounter (Signed)
Called pt after receiving the following emails   Madison Holt, Madison Holt to Madison Shan, MD   06/15/20 11:25 AM  Azithromycin, Prednisone and a cough suppressant that doesn't work. I don't remember discussing inhalers with him at my appt a couple of weeks ago. I haven't tried the xopenex. Never heard of it. Whichever inhaler he wants Madison Holt to try, I will be willing, however I cannot have Albuterol. I am going to get a breathing treatment. If that doesn't help, I will send another message requesting an appt. Right now, I have no voice.  Madison Holt to Madison Holt   06/15/20 11:15 AM  What medication did they give you? Dr Marchelle Gearing had mentioned xopenex inhaler at the last visit to try instead of albuterol. Have you ever tried this yet? We do not have any openings today but could get you in with the NP on Wednesday if you would like?   Last read by Madison Holt at 11:57 AM on 06/15/2020.  Madison Holt to Madison Shan, MD   06/15/20 10:56 AM  Thank you. I had mentioned to the nurse at my last appt that I would like a refill, but I didn't see it in my after visit summary. I use it when it's needed, not everyday. I was seen in my walk-in department last Friday, but the medication is not working at this time. Just a few minutes ago, I became short of breath and had to regain control of my breathing. I am thinking about going back to my walk-in department for a breathing treatment, but I would like to be able to pick up the inhaler and have it when I need it. Thanks, again.   06/15/20 10:50 AMYou routed this conversation to Madison Shan, MD  Madison Holt to Madison Holt, Madison Holt   06/15/20 10:50 AM  Hi Madison Holt. I see that the Qvar is still on your med list, but Dr Marchelle Gearing did not mention continuing this on the last visit. I just want to check with him before we send to the pharmacy. I will let you know once he responds. Hope you have a great day! :)   Last read by Madison Holt at 11:57 AM on  06/15/2020.  Madison Holt  06/15/20 10:49 AM  Note  MR- please see pt email and advise on qvar refill. I do not see the med mentioned last ov, however med is still on her med list. Thanks!  I would like to request a refill on the Qvar Redihaler that was last prescribed to Madison Holt. I cannot have the inhaler with Albuterol. [facility] [place]. Thank you.  Madison Holt to Madison Shan, MD   06/15/20 10:38 AM  I would like to request a refill on the Qvar Redihaler that was last prescribed to Madison Holt. I cannot have the inhaler with Albuterol. [facility] [place]. Thank you.    The pt states started with sore throat 5 days ago, then started having increased SOB and chest tightness. Currently on pred 10 mg bid. She has been seen by UC once 4 days ago, and is back there today getting neb tx and depo. She is asking for refill on Qvar or something to help her SOB so she does not have to keep going to get neb tx at [facility]. Please advise thanks

## 2020-06-15 NOTE — Telephone Encounter (Signed)
MR- please see pt email and advise on qvar refill. I do not see the med mentioned last ov, however med is still on her med list. Thanks!  I would like to request a refill on the Qvar Redihaler that was last prescribed to me.  I cannot have the inhaler with Albuterol.  [facility] [place].  Thank you.

## 2020-06-26 ENCOUNTER — Telehealth: Payer: Self-pay | Admitting: Internal Medicine

## 2020-06-26 ENCOUNTER — Other Ambulatory Visit: Payer: Self-pay | Admitting: *Deleted

## 2020-06-26 NOTE — Progress Notes (Signed)
error 

## 2020-06-26 NOTE — Telephone Encounter (Signed)
PA request was received from [facility] Phone:[phone] Fax: [phone] Medication name and strength: Levalbuterol Tartrate Ordering Provider: Marchelle Gearing  Was PA started with [facility]?: yes If yes, please enter KEY: [id number] Medication tried and failed: albuterol HFA Covered Alternatives: albuterol HFA (which pt cannot tolerate)  PA sent to plan, time frame for approval / denial: Your information has been submitted to [facility] and is being reviewed. You may close this dialog, return to your dashboard, and perform other tasks. You will receive an electronic determination in CoverMyMeds within 72-120 hours; you'll also receive a faxed copy. You can see the latest determination by locating this request on your dashboard or reopening this request. If Rosann Auerbach has not responded in 120 hours, contact [employer] at [phone].  Routing to myself for follow-up

## 2020-06-29 NOTE — Telephone Encounter (Addendum)
PA still being reviewed.

## 2020-06-30 NOTE — Telephone Encounter (Signed)
PA is still being reviewed

## 2020-07-03 NOTE — Telephone Encounter (Signed)
Fax received from pt's insurance stating that the PA for pt's levalbuterol tartrate hfa had been reviewed. It states: based on the information provided, I am unable to approve coverage for this medication because there is nothing to support that for the following covered alternative: albuterol hfa (generics for proair and proventil)-either 1. Individual has had inadequate efficacy to the covered alternative or 2. The individual has a contraindication according to FDA label, significant intolerance, or is not a candidate for the covered alternative.  In the PA that was done, it was put that pt could not tolerate albuterol due to it making her very jittery feeling and that was why she was needing to have levalbuterol.  Attempted to call pt to see if she had been able to pick up her inhaler from pharmacy but unable to reach. Left pt a message for her to return call.   Decided to call pharmacy to see if pt had been able to pick up Rx after it was last refilled and was told that pt was able to get it 4/11 the day Rx was sent to the pharmacy. Nothing further needed.

## 2020-07-10 ENCOUNTER — Telehealth: Payer: Self-pay | Admitting: Internal Medicine

## 2020-07-10 NOTE — Telephone Encounter (Signed)
See PN 06/26/20 for details regarding rx for levalbuterol-   LMTCB for the pt

## 2020-07-10 NOTE — Telephone Encounter (Signed)
Spoke with the pt  I explained that we had called to ask her about her xopenex inhaler, and then learned she already had picked it up  She states only had to pay $50 out of pocket and was fine with this since only using prn  Nothing further needed

## 2020-08-06 ENCOUNTER — Other Ambulatory Visit: Payer: Self-pay | Admitting: Family Medicine

## 2020-08-06 ENCOUNTER — Other Ambulatory Visit: Payer: Self-pay | Admitting: *Deleted

## 2020-08-06 MED ORDER — PREDNISONE 20 MG PO TABS: ORAL_TABLET | ORAL | 0 refills | Status: DC

## 2020-08-06 MED ORDER — BUTALBITAL-APAP-CAFFEINE 50-300-40 MG PO CAPS: 1.0000 | ORAL_CAPSULE | Freq: Four times a day (QID) | ORAL | 0 refills | Status: DC | PRN

## 2020-08-06 MED ORDER — NIRMATRELVIR/RITONAVIR (PAXLOVID)TABLET: 3.0000 | ORAL_TABLET | Freq: Two times a day (BID) | ORAL | 0 refills | Status: AC

## 2020-08-06 NOTE — Telephone Encounter (Signed)
Patient sent this email    I tested positive for covid today through a rapid test at my work [facility]).  Initially, I had a fever, fatigue, and a headache. I have now developed a cough and nausea, my headache is the worst it has ever been and I can't get rid of my fever. I have been taking Tylenol and ondansetron, with no relief. Is there anything else I can take? Is there anything I need to do or be aware of as far as my sarcoidosis?   I have contacted my primary care provider as well.   I look forward to hearing room you.   Madison Holt  I tried to call patient to get more details about when her symptoms started was unable to reach   Sending to Dr. Marchelle Gearing for recommendations.

## 2020-08-06 NOTE — Telephone Encounter (Signed)
Patient sent another email with details of symptoms  Good morning. I'm sorry I missed your call. I believe my symptoms started Tuesday with a fatigue and a headache. I went to work and managed to get through the day. It wasn't until yesterday that my symptoms became more severe. I have a tremendous headache, which I have never experienced this severity of pain in my head before. I am dizzy, fatigued, and I have congestion in which I cough. My throat doesn't hurt unless I cough and my chest doesn't hurt. I feel like all of my issues are in my head; headache, dizziness, fatigue, and congestion. If there is something I can do for this headache, that would be great! Tylenol is not helping to relieve any pain. Thank you

## 2020-08-06 NOTE — Telephone Encounter (Signed)
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  Mom and Dad told me.

## 2020-08-06 NOTE — Telephone Encounter (Signed)
   Meds ordered this encounter  Medications  . predniSONE (DELTASONE) 20 MG tablet    Sig: 2 tabs po for 4 days, then 1 tab po for 4 days    Dispense:  12 tablet    Refill:  0  . Butalbital-APAP-Caffeine 50-300-40 MG CAPS    Sig: Take 1 tablet by mouth every 6 (six) hours as needed (headache).    Dispense:  30 capsule    Refill:  0

## 2020-08-06 NOTE — Telephone Encounter (Signed)
Lot of symptoms likely from covid  Plan  - talk to PCP Cop[place], Karleen Hampshire, MD about general symptoms mgmt  - for covid - sarcoid could potentially pose a burden to progression esp as she is not vaaccinated (pls double check) but this variant is mild but if <=5 d of symptoms  Recommend paxlovid as below  PAXLOVID   Paxlovid (nirmatelvir 300/Ritonavir100) - BID x 5 days - for GFR >= 60   If [name] is on any of the following Strong CYP3A inhibtors - he should wtithold. Please check med list and ensure if h sjeis taking any of these:  alfuzosin, amiodarone, clozapine, colchicine, dihydroergotamine, dronedarone, ergotamine, flecainide, lovastatin, lurasidone, methylergonovine, midazolam [oral], pethidine, pimozide, propafenone, propoxyphene, quinidine, ranolazine, sildenafil simvastatin, triazolam). PLEASE CHECK MED LIST  If   [name]   Is on any of these other strong CYP3A inducers apalutamide, carbamazepine, phenobarbital, phenytoin, rifampin, St John's wort) - let me know immediately and we should delay starting paxlovid by some days even if he stops these medication. PLEASE CHECK MED LIST  PLEASE INFORM Vermelle L Papandrea  OF FOLLOWING SIDE EFFECTS  Side effects - all < 5%  - skin rash (and veyr rare a conditon called TEN) - angiomedia  - myalgia - jaundice - high bP (1%) - loss of taste  - diarrhea     Immunization History  Administered Date(s) Administered  . Influenza,inj,Quad PF,6+ Mos 12/21/2015, 01/03/2018  . PFIZER(Purple Top)SARS-COV-2 Vaccination 07/19/2019, 08/13/2019  . Tdap 05/29/2019      PFT Results Latest Ref Rng & Units 03/29/2016 05/04/2015  FVC-Pre L 3.71 3.86  FVC-Predicted Pre % 99 103  FVC-Post L 3.78 3.90  FVC-Predicted Post % 101 104  Pre FEV1/FVC % % 69 70  Post FEV1/FCV % % 75 76  FEV1-Pre L 2.57 2.69  FEV1-Predicted Pre % 83 87  FEV1-Post L 2.82 2.95  DLCO uncorrected ml/min/mmHg 21.71 21.37  DLCO UNC% % 89 88  DLCO corrected  ml/min/mmHg 20.49 -  DLCO COR %Predicted % 84 -  DLVA Predicted % 99 93  TLC L 5.19 5.21  TLC % Predicted % 102 103  RV % Predicted % 105 100   IMPRESSION: 1. No evidence of pulmonary embolism. 2. No significant change in the patchy bilateral upper lobe predominant perilymphatic nodularity and foci of bandlike and nodular foci of consolidation in bilateral lungs with associated mild volume loss and distortion, compatible with known sarcoidosis. 3. Slightly decreased size of the borderline enlarged/prominent mediastinal and hilar lymph nodes, also compatible with known sarcoidosis   Electronically Signed   By: Maudry Mayhew MD   On: 04/21/2020 15:46   Allergies  Allergen Reactions  . Codeine Other (See Comments)    Other reaction(s): Hallucination  . Albuterol     Pt feels shaky.   Truman Hayward [Cefdinir] Diarrhea     Current Outpatient Medications:  .  acetaminophen (TYLENOL) 325 MG tablet, Take 650 mg by mouth every 6 (six) hours as needed for mild pain or headache.  (Patient not taking: Reported on 06/02/2020), Disp: , Rfl:  .  beclomethasone (QVAR REDIHALER) 40 MCG/ACT inhaler, Inhale 1 puff into the lungs 2 (two) times daily. (Patient not taking: Reported on 06/02/2020), Disp: 10.6 g, Rfl: 2 .  beclomethasone (QVAR) 80 MCG/ACT inhaler, Inhale 2 puffs into the lungs 2 (two) times daily., Disp: 1 each, Rfl: 5 .  hyoscyamine (LEVSIN SL) 0.125 MG SL tablet, Place under the tongue every 6 (six) hours as  needed., Disp: , Rfl:  .  levalbuterol (XOPENEX HFA) 45 MCG/ACT inhaler, Inhale 2 puffs into the lungs every 8 (eight) hours as needed for wheezing., Disp: 1 each, Rfl: 5 .  ondansetron (ZOFRAN-ODT) 4 MG disintegrating tablet, Take 4 mg by mouth every 8 (eight) hours as needed., Disp: , Rfl:  .  traMADol (ULTRAM) 50 MG tablet, Take 1 tablet (50 mg total) by mouth every 6 (six) hours as needed. (Patient not taking: Reported on 06/02/2020), Disp: 40 tablet, Rfl: 1 .  traZODone  (DESYREL) 50 MG tablet, TAKE 0.5-1 TABLETS (25-50 MG TOTAL) BY MOUTH AT BEDTIME AS NEEDED FOR SLEEP. (Patient not taking: Reported on 06/02/2020), Disp: 90 tablet, Rfl: 0

## 2020-08-06 NOTE — Telephone Encounter (Signed)
Dr. Marchelle Gearing, Ms. Humble wanted you to know that she has had the first and second doses of the covid vaccine, she has just not had the booster.  I told her I would make you aware.  Thank you.

## 2020-08-07 NOTE — Telephone Encounter (Signed)
Thanks for making note of her vaccine status. Please let her know that paxlovid is effective if symptom onsent is < 5 days

## 2020-08-09 NOTE — Telephone Encounter (Signed)
Received refill error.  Rx was not transmitted to pharmacy.

## 2020-08-09 NOTE — Addendum Note (Signed)
Addended by: Damita Lack on: 08/09/2020 07:34 PM   Modules accepted: Orders

## 2020-08-10 MED ORDER — BUTALBITAL-APAP-CAFFEINE 50-300-40 MG PO CAPS: ORAL_CAPSULE | ORAL | 0 refills | Status: DC

## 2020-09-14 ENCOUNTER — Ambulatory Visit: Payer: Managed Care, Other (non HMO) | Admitting: Dermatology

## 2020-09-14 ENCOUNTER — Other Ambulatory Visit: Payer: Self-pay

## 2020-09-14 DIAGNOSIS — B360 Pityriasis versicolor: Secondary | ICD-10-CM

## 2020-09-14 MED ORDER — SELENIUM SULFIDE 2.5 % EX LOTN: TOPICAL_LOTION | CUTANEOUS | 3 refills | Status: DC

## 2020-09-14 MED ORDER — KETOCONAZOLE 2 % EX CREA: TOPICAL_CREAM | CUTANEOUS | 3 refills | Status: DC

## 2020-09-14 NOTE — Progress Notes (Signed)
   New Patient Visit  Subjective  Madison Holt is a 40 y.o. female who presents for the following: Rash (On the back - has been there for several years but patient has noticed it growing larger and becoming itchy. 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 Flares when hot and sweaty.  The following portions of the chart were reviewed this encounter and updated as appropriate:       Review of Systems:  No other skin or systemic complaints except as noted in HPI or Assessment and Plan.  Objective  Well appearing patient in no apparent distress; mood and affect are within normal limits.  A focused examination was performed including the back. Relevant physical exam findings are noted in the Assessment and Plan.  L mid back at braline, R mid back Light pink brown patches with slight scale.   Assessment & Plan  Tinea versicolor L mid back at braline, R mid back  Discussed recurrence and persistence - Tinea versicolor is a skin infection. It is caused by a type of yeast. It is normal for some yeast to be on your skin, but too much yeast causes thisinfection. The infection causes a rash of light or dark patches on your skin. The rash is most common on the chest, back, neck, or upper arms. The infection usually does not cause other problems. If it is treated, it will probably go away in a few weeks. The infection cannot be spread from one person to another (is not contagious).  Start Selenium sulfide 2.5% suspension Apply to aa's QHS. Wash off in the morning. Use 3 nights in a row. Afterwards, use twice per week in shower in summer months.   Start Ketoconazole 2% cream to aa's QHS.   Recommend Vanicream Z-Bar daily in the shower for prevention.   selenium sulfide (SELSUN) 2.5 % shampoo - L mid back at braline, R mid back Apply to aa's QHS. Wash off in the morning. Use 3 nights in a row. Afterwards, use twice per week in summer  months.  ketoconazole (NIZORAL) 2 % cream - L mid back at braline, R mid back For tinea versicolor apply to aa's back QHS.  Return if symptoms worsen or fail to improve.  Maylene Roes, CMA, am acting as scribe for Willeen Niece, MD .  Documentation: I have reviewed the above documentation for accuracy and completeness, and I agree with the above.  Willeen Niece MD

## 2020-09-14 NOTE — Patient Instructions (Addendum)
If you have any questions or concerns for your doctor, please call our main line at [phone] and press option 4 to reach your doctor's medical assistant. If no one answers, please leave a voicemail as directed and we will return your call as soon as possible. Messages left after 4 pm will be answered the following business day.   You may also send [place] a message via MyChart. We typically respond to MyChart messages within 1-2 business days.  For prescription refills, please ask your pharmacy to contact our office. Our fax number is [phone].  If you have an urgent issue when the clinic is closed that cannot wait until the next business day, you can page your doctor at the number below.    Please note that while we do our best to be available for urgent issues outside of office hours, we are not available 24/7.   If you have an urgent issue and are unable to reach [place], you may choose to seek medical care at your doctor's office, retail clinic, urgent care center, or emergency room.  If you have a medical emergency, please immediately call 911 or go to the emergency department.  Pager Numbers  - Dr. Gwen Pounds: [phone]  - Dr. Neale Burly: [phone]  - Dr. Roseanne Reno: [phone]  In the event of inclement weather, please call our main line at [phone] for an update on the status of any delays or closures.  Dermatology Medication Tips: Please keep the boxes that topical medications come in in order to help keep track of the instructions about where and how to use these. Pharmacies typically print the medication instructions only on the boxes and not directly on the medication tubes.   If your medication is too expensive, please contact our office at [phone] option 4 or send [place] a message through MyChart.   We are unable to tell what your co-pay for medications will be in advance as this is different depending on your insurance coverage. However, we may be able to find a substitute  medication at lower cost or fill out paperwork to get insurance to cover a needed medication.   If a prior authorization is required to get your medication covered by your insurance company, please allow [place] 1-2 business days to complete this process.  Drug prices often vary depending on where the prescription is filled and some pharmacies may offer cheaper prices.  The website www.goodrx.com contains coupons for medications through different pharmacies. The prices here do not account for what the cost may be with help from insurance (it may be cheaper with your insurance), but the website can give you the price if you did not use any insurance.  - You can print the associated coupon and take it with your prescription to the pharmacy.  - You may also stop by our office during regular business hours and pick up a GoodRx coupon card.  - If you need your prescription sent electronically to a different pharmacy, notify our office through [facility] or by phone at [phone] option 4.  Tinea Versicolor  Tinea versicolor is a skin infection. It is caused by a type of yeast. It is normal for some yeast to be on your skin, but too much yeast causes thisinfection. The infection causes a rash of light or dark patches on your skin. The rash is most common on the chest, back, neck, or upper arms. The infection usually does not cause other problems. If it is treated, it will probably  go away in a few weeks. The infection cannot be spread from one person to another (is not contagious). Follow these instructions at home: Use over-the-counter and prescription medicines only as told by your doctor. Scrub your skin every day with dandruff shampoo as told by your doctor. Do not scratch your skin in the rash area. Avoid places that are hot and humid. Do not use tanning booths. Try to avoid sweating a lot. Contact a doctor if: Your symptoms get worse. You have a fever. You have redness, swelling, or  pain in the rash area. You have fluid or blood coming from your rash. Your rash feels warm to the touch. You have pus or a bad smell coming from your rash. Your rash comes back (recurs) after treatment. Summary Tinea versicolor is a skin infection. It causes a rash of light or dark patches on your skin. The rash is most common on the chest, back, neck, or upper arms. This infection usually does not cause other problems. Use over-the-counter and prescription medicines only as told by your doctor. If the infection is treated, it will probably go away in a few weeks. This information is not intended to replace advice given to you by your health care provider. Make sure you discuss any questions you have with your healthcare provider. Document Revised: 12/17/2019 Document Reviewed: 12/18/2019 Elsevier Patient Education  2022 [employer].

## 2021-06-03 ENCOUNTER — Ambulatory Visit: Payer: Managed Care, Other (non HMO) | Admitting: Internal Medicine

## 2021-07-15 ENCOUNTER — Other Ambulatory Visit: Payer: Self-pay | Admitting: *Deleted

## 2021-07-15 DIAGNOSIS — D860 Sarcoidosis of lung: Secondary | ICD-10-CM

## 2021-07-16 ENCOUNTER — Ambulatory Visit: Payer: Managed Care, Other (non HMO) | Admitting: Internal Medicine

## 2021-07-16 ENCOUNTER — Encounter: Payer: Self-pay | Admitting: Internal Medicine

## 2021-07-16 ENCOUNTER — Ambulatory Visit (INDEPENDENT_AMBULATORY_CARE_PROVIDER_SITE_OTHER): Payer: Managed Care, Other (non HMO) | Admitting: Internal Medicine

## 2021-07-16 VITALS — BP 118/64 | HR 81 | Temp 97.9°F | Ht 64.0 in | Wt 143.0 lb

## 2021-07-16 DIAGNOSIS — D860 Sarcoidosis of lung: Secondary | ICD-10-CM | POA: Diagnosis not present

## 2021-07-16 DIAGNOSIS — R0609 Other forms of dyspnea: Secondary | ICD-10-CM

## 2021-07-16 DIAGNOSIS — R0689 Other abnormalities of breathing: Secondary | ICD-10-CM | POA: Diagnosis not present

## 2021-07-16 NOTE — Patient Instructions (Addendum)
ICD-10-CM   ?1. 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  ?2. DOE (dyspnea on exertion)  R06.09   ?  ?3. Sighing respiration  R06.89   ?  ? ? ? ?-Clinically stable on CT scan of the chest 2022 ?- PFT normal May 2023 ?-Subjectively also stable with mild levels of random shortness of breath and sigh ? ?Plan ?-Continue supportive care ?-refer pulmonary rehab at [facility] ?- can get echo next few to several weeks at [facility] for completeness esp in setting of sarcoid  ? ?Folllwup ?1 year or sooner if needed ?

## 2021-07-16 NOTE — Progress Notes (Signed)
? ? ?41 yo Female former smoker seen for pulmonary consult 05/10/2005 by Dr. Marchelle Gearingamaswamy for shortness of breath and cough. ? ?TEST ?Pulmonary function tests of 05/04/2015 normal ? ?Autoimmune workup 05/06/2015 extensive panel is essentially normal except angiotensin-converting enzyme is elevated at 60 and c-ANCA  is trace positive at 1:40 but PR 3 and MPO antibodies are negative. ? ?05/06/2015 she also had CT chest with contrast and high resolution CT chest without contrast: There classic pulmonary infiltrates and hilar and subcarinal lymphadenopathy. Overall pattern is consistent with sarcoidosis radiologically according to the radiologist. I personally visualized this film.  ? ?Walking desat test 185 feet  X 3 laps on RA -> did not desatrurate ? ? ?06/29/2015 Follow up : Bx results ?Patient returns for a follow-up. Patient was seen last month for pulmonary consult for shortness of breath and cough and suspected sarcoid. Patient had been having cough, shortness of breath. Pulmonary function test was normal. An autoimmune panel in March was essentially negative except for cANCA that was trace positive. Ace level was elevated (PR-3 and MPO negative). CT chest showed pulmonary infiltrates and hilar and subcarinal lymphadenopathy. Patient was sent for a surgical consult. Patient underwent a bronchoscopy, endobronchial ultrasound and mediastinoscopy on 06/05/15 . Surgical pathology came back for nonnecrotizing granuloma and hyaline fibrosis in the lymph nodes. Lung biopsy showed no granulomas or malignancy. [facility] for acute illness with chills and severe nausea. She was started on prednisone 40 mg. She says that her acute symptoms have resolved. She has not seen any change in her breathing. She continues to have fatigue, cough. She denies any rash, chest pain, orthopnea, PND, hemoptysis, orthopnea. She says that she has gained some weight since starting prednisone. We discussed healthy  dietary choices and exercise. She says that she does have some visual changes. Over the last year. Recommend that she make an appointment with ophthalmology for a annual exam and that she will need annual eye exams yearly. ?She is currently on prednisone 40 mg daily ? ? ?REC ?pred and taper and hold at 10mg  per day ? ?OV 08/10/2015 ? ?Chief Complaint  ?Patient presents with  ? Follow-up  ?  Pt c/o occasional dry cough and SOB/chest tightness with exertion. Pt denies wheeze/CP. Pt states that her breathing is improving. She has noticed that weather does affect her breathing, mostly with extremes in air temperature and humidity.   ? ? ? 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 ? ? 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 At that point in time she already been on a few weeks of prednisone at 40 mg per day. At that visit 06/29/2015 she was better. She was asked to taper her prednisone and continue at 10 mg per day. However she ran out of the bottle and she stopped taking the prednisone one week ago. However it turns out that she is reluctant to take prednisone because of weight gain. She says she has gained 30 pounds of weight which is more than her pregnancy weight gain in the past. She does admit that prednisone helped her a lot. While shortness of breath cough chest tightness was all improved with prednisone significantly but now off prednisone for weeks symptoms are returning. The heat and humidity do bother her more significantly. Per hx - eeye exam - no sarcoid ? ?Wondering about measures she could' do to coutneract weight gain ? ? ?OV 11/16/2015 ? ?Chief Complaint  ?Patient presents with  ? Follow-up  ?  been off steriods for x 1 month and more sob faster, dry cough, chest pain and some tightness  ? ?Follow-up stage II pulmonary sarcoidosis-diagnosis established March 2017 ? ?Last visit June 2017. We reinitiated prednisone. However she went to the beach and forgot to take prednisone. She  has not taken prednisone in over a month. With this she says that her mood swings are better. She still has some sleep issues. Being off prednisone initially she was fine but now she feels the shortness of breath and cough are coming back. She struggling between managing the side effects of prednisone and the symptoms of sarcoidosis. Last chest x-ray 06/05/2015 at the time diagnosis was made. Last pulmonary function test was in February 2017 for diagnosis was made. 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 ? ? ?OV 06/26/2017 ?Chief Complaint  ?Patient presents with  ? Follow-up  ?  reports some SOB since weather has changed, overall states she has been okay.   ? ?41 year old female with stage II sarcoidosis on observation therapy ? ?Presents for routine follow-up.  It has been over a year since I last saw her.  Overall in the past year she has been stable.  She continues to work Psychologist, sport and exercise at medical clinic.  She continues to feel shortness of breath on talking.  She feels like she cannot to get a good deep breath.  Long-acting beta agonist made her tachycardic.  Prednisone made her gain weight and since being off prednisone she has not lost that weight.  Prednisone also made her mood changes and these have improved/resolved after stopping prednisone.  She gets easily dyspneic on minimal exertion.  She says she is to be an athlete but is unable to do these activities.  She just feels like she needs to get a good deep breath.  She is open to the idea of having pulmonary stress test.  A cardiac stress test a few years ago apparently was normal.  She does not have any other clinical symptoms of sarcoidosis and she feels a sarcoidosis under control.  She is not keen on repeating a chest x-ray or having pulmonary function test.  Socially she has been busy getting her son into college and a son in high school and now the son 56 years  old ? ? ? ?OV 12/15/2017 ? ?Subjective:  ?Patient ID: [name], female , DOB: 25-Nov-1980 , age 75 y.o. , MRN: [id number] , ADDRESS: [address] ?[name] [address] ? ? ?12/15/2017 -   ?Chief Complaint  ?Patient presents with  ? Follow-up  ?  Did not do the cpst due to cost breathing is about the same.  ? ? ? ?HPI ?[name] 41 y.o. 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  She is here for six-month follow-up.  She continues to have mild intermittent shortness of breath.  This happens episodically.  She takes Qvar and it goes away.  Heat makes it worse.  It almost sounds like asthma.  We recommended a pulmonary stress test but she has deferred it because of a high deductible cost to it.  She has not had a flu shot but will have it with her employer.  There are no other issues.  With the onset of fall she is actually feeling better.  At this point in time she just wants supportive and expectant follow-up.  She is willing to try bronchodilator as long as it does not cause of tremors.  Albuterol causes tremors.  She is open to trying to Xopenex if you have a sample.  She is not sure she wants to afford or pay for Xopenex.  Especially now that she is feeling better. ? ? ? ?OV 04/11/2018 ? ?Subjective:  ?Patient ID: [name], female , DOB: [birth year] , age 41 y.o. , MRN: [id number] , ADDRESS: [address] ?[name] [address] ? ? ?04/11/2018 -   ?Chief Complaint  ?Patient presents with  ? Results  ?  Chest CT  ? ?Stage II pulmonary sarcoidosis on observation therapy ? ?HPI ?[name] 41 y.o. -presents for follow-up.  At this point in time she feels stable with her baseline exertional dyspnea and fatigue.  There are no new issues other than the fact earlier in January 2020 she had respiratory infection.  With doxycycline and prednisone it improved and she is back to baseline.  Of note she did get Omnicef and discussed diarrhea and she had to stop.  She had a chest x-ray  during this time and there was concern for pulmonary infiltrates.  Therefore because it is been 3 years since we did a CT chest we got a CT chest and she is here to review.  I personally visualized the

## 2021-07-16 NOTE — Progress Notes (Signed)
Full PFT performed today. °

## 2021-07-16 NOTE — Patient Instructions (Signed)
Full PFT performed today. °

## 2021-08-03 LAB — PULMONARY FUNCTION TEST
DL/VA % pred: 96 %
DL/VA: 4.27 ml/min/mmHg/L
DLCO cor % pred: 96 %
DLCO cor: 20.92 ml/min/mmHg
DLCO unc % pred: 96 %
DLCO unc: 20.92 ml/min/mmHg
FEF 25-75 Post: 1.99 L/sec
FEF 25-75 Pre: 1.52 L/sec
FEF2575-%Change-Post: 31 %
FEF2575-%Pred-Post: 65 %
FEF2575-%Pred-Pre: 49 %
FEV1-%Change-Post: 11 %
FEV1-%Pred-Post: 89 %
FEV1-%Pred-Pre: 80 %
FEV1-Post: 2.66 L
FEV1-Pre: 2.4 L
FEV1FVC-%Change-Post: 12 %
FEV1FVC-%Pred-Pre: 80 %
FEV6-%Change-Post: 0 %
FEV6-%Pred-Post: 100 %
FEV6-%Pred-Pre: 101 %
FEV6-Post: 3.61 L
FEV6-Pre: 3.64 L
FEV6FVC-%Change-Post: 0 %
FEV6FVC-%Pred-Post: 101 %
FEV6FVC-%Pred-Pre: 101 %
FVC-%Change-Post: -1 %
FVC-%Pred-Post: 98 %
FVC-%Pred-Pre: 99 %
FVC-Post: 3.61 L
FVC-Pre: 3.66 L
Post FEV1/FVC ratio: 74 %
Post FEV6/FVC ratio: 100 %
Pre FEV1/FVC ratio: 66 %
Pre FEV6/FVC Ratio: 100 %
RV % pred: 109 %
RV: 1.77 L
TLC % pred: 108 %
TLC: 5.5 L

## 2021-08-09 ENCOUNTER — Ambulatory Visit: Payer: Managed Care, Other (non HMO)

## 2021-10-14 ENCOUNTER — Encounter: Payer: Self-pay | Admitting: Family Medicine

## 2021-10-14 MED ORDER — BUTALBITAL-APAP-CAFFEINE 50-300-40 MG PO CAPS: ORAL_CAPSULE | ORAL | 0 refills | Status: AC

## 2021-10-14 NOTE — Telephone Encounter (Signed)
Not on current medication list.  Patient has not been seen since 2021.

## 2021-10-14 NOTE — Telephone Encounter (Signed)
Name of Medication: Butalbital-APAP-Caffeine 50-300-40 mg Name of Pharmacy: [name] Last Fill or Written Date and Quantity: #30 on 08/10/2020 Last Office Visit and Type: 07/24/2019 fatigue Next Office Visit and Type: none scheduled

## 2021-11-11 ENCOUNTER — Encounter: Payer: Managed Care, Other (non HMO) | Admitting: Family Medicine

## 2022-01-19 ENCOUNTER — Ambulatory Visit: Payer: Managed Care, Other (non HMO) | Admitting: Family Medicine

## 2022-01-19 ENCOUNTER — Encounter: Payer: Self-pay | Admitting: Family Medicine

## 2022-01-19 VITALS — BP 120/80 | HR 80 | Temp 98.4°F | Ht 63.0 in | Wt 147.2 lb

## 2022-01-19 DIAGNOSIS — B001 Herpesviral vesicular dermatitis: Secondary | ICD-10-CM | POA: Diagnosis not present

## 2022-01-19 MED ORDER — VALACYCLOVIR HCL 1 G PO TABS: ORAL_TABLET | ORAL | 0 refills | Status: DC

## 2022-01-19 NOTE — Progress Notes (Signed)
    Parthena Fergeson T. Jemina Scahill, MD, CAQ Sports Medicine [facility] at [facility] [address] [place], [address]  Phone: [phone]  FAX: [phone]  Madison Holt - 41 y.o. female  MRN [id number]  Date of Birth: Jan 20, 1981  Date: 01/19/2022  PCP: Hannah Beat, MD  Referral: Hannah Beat, MD  Chief Complaint  Patient presents with   Fever Blisters   Subjective:   Madison Holt is a 41 y.o. very pleasant female patient with Body mass index is 26.08 kg/m. who presents with the following:  Well-known patient who presents with upper lip fever blisters that is been present over the last day or so.  She has tried some Abreva without much success, she is here today for additional recommendations and management.  Review of Systems is noted in the HPI, as appropriate  Objective:   BP 120/80   Pulse 80   Temp 98.4 F (36.9 C) (Oral)   Ht 5\' 3"  (1.6 m)   Wt 147 lb 4 oz (66.8 kg)   LMP 09/27/2016 (Exact Date)   SpO2 99%   BMI 26.08 kg/m   GEN: No acute distress; alert,appropriate. PULM: Breathing comfortably in no respiratory distress PSYCH: Normally interactive.  Cold sores on upper lip.  Laboratory and Imaging Data:  Assessment and Plan:     ICD-10-CM   1. Herpes labialis  B00.1      Treat as such.  Medication Management during today's office visit: Meds ordered this encounter  Medications   valACYclovir (VALTREX) 1000 MG tablet    Sig: 2 tabs po 12 hours apart for 2 doses    Dispense:  28 tablet    Refill:  0   Medications Discontinued During This Encounter  Medication Reason   acetaminophen (TYLENOL) 325 MG tablet Patient Preference    Orders placed today for conditions managed today: No orders of the defined types were placed in this encounter.   Disposition: No follow-ups on file.  Dr[employer] One speech-to-text software was used for transcription in this dictation.  Possible transcriptional errors can occur  using 09/29/2016.   Signed,  Animal nutritionist. Roni Friberg, MD   Outpatient Encounter Medications as of 01/19/2022  Medication Sig   Butalbital-APAP-Caffeine 50-300-40 MG CAPS TAKE 1 BY MOUTH EVERY 6 (SIX) HOURS AS NEEDED (HEADACHE).   hyoscyamine (LEVSIN SL) 0.125 MG SL tablet Place under the tongue every 6 (six) hours as needed.   ondansetron (ZOFRAN-ODT) 4 MG disintegrating tablet Take 4 mg by mouth every 8 (eight) hours as needed.   pantoprazole (PROTONIX) 40 MG tablet Take 40 mg by mouth daily.   valACYclovir (VALTREX) 1000 MG tablet 2 tabs po 12 hours apart for 2 doses   [DISCONTINUED] acetaminophen (TYLENOL) 325 MG tablet Take 650 mg by mouth every 6 (six) hours as needed for mild pain or headache.   No facility-administered encounter medications on file as of 01/19/2022.

## 2022-04-14 LAB — RESULTS CONSOLE HPV: CHL HPV: NEGATIVE

## 2022-04-14 LAB — HM PAP SMEAR: HM Pap smear: NEGATIVE

## 2022-10-16 NOTE — Progress Notes (Unsigned)
    Kurt Hoffmeier T. Madison Elms, MD, CAQ Sports Medicine [facility] at [facility] [address] [place], [address]  Phone: [phone]  FAX: [phone]  Madison Holt - 42 y.o. female  MRN [id number]  Date of Birth: 1980-05-10  Date: 10/17/2022  PCP: Hannah Beat, MD  Referral: Hannah Beat, MD  No chief complaint on file.  Subjective:   Madison Holt is a 42 y.o. very pleasant female patient with There is no height or weight on file to calculate BMI. who presents with the following:  The patient presents with some ongoing questions about weight loss.  She does have a history of pulmonary sarcoidosis, she regularly sees pulmonology.    Review of Systems is noted in the HPI, as appropriate  Objective:   LMP 09/27/2016 (Exact Date)   GEN: No acute distress; alert,appropriate. PULM: Breathing comfortably in no respiratory distress PSYCH: Normally interactive.   Laboratory and Imaging Data:  Assessment and Plan:   ***

## 2022-10-17 ENCOUNTER — Ambulatory Visit: Payer: Managed Care, Other (non HMO) | Admitting: Family Medicine

## 2022-10-17 ENCOUNTER — Ambulatory Visit: Payer: Managed Care, Other (non HMO) | Admitting: Internal Medicine

## 2022-10-17 ENCOUNTER — Encounter: Payer: Self-pay | Admitting: Internal Medicine

## 2022-10-17 ENCOUNTER — Encounter: Payer: Self-pay | Admitting: Family Medicine

## 2022-10-17 VITALS — BP 120/90 | HR 81 | Ht 63.0 in | Wt 149.6 lb

## 2022-10-17 VITALS — BP 120/80 | HR 74 | Temp 98.3°F | Ht 63.0 in | Wt 147.2 lb

## 2022-10-17 DIAGNOSIS — R634 Abnormal weight loss: Secondary | ICD-10-CM

## 2022-10-17 DIAGNOSIS — Z13220 Encounter for screening for lipoid disorders: Secondary | ICD-10-CM

## 2022-10-17 DIAGNOSIS — D860 Sarcoidosis of lung: Secondary | ICD-10-CM

## 2022-10-17 DIAGNOSIS — Z79899 Other long term (current) drug therapy: Secondary | ICD-10-CM

## 2022-10-17 DIAGNOSIS — R0609 Other forms of dyspnea: Secondary | ICD-10-CM

## 2022-10-17 DIAGNOSIS — Z131 Encounter for screening for diabetes mellitus: Secondary | ICD-10-CM | POA: Diagnosis not present

## 2022-10-17 DIAGNOSIS — R0689 Other abnormalities of breathing: Secondary | ICD-10-CM

## 2022-10-17 LAB — BASIC METABOLIC PANEL
BUN: 14 mg/dL (ref 6–23)
CO2: 27 mEq/L (ref 19–32)
Calcium: 9.6 mg/dL (ref 8.4–10.5)
Chloride: 100 mEq/L (ref 96–112)
Creatinine, Ser: 0.89 mg/dL (ref 0.40–1.20)
GFR: 79.29 mL/min (ref >60.00)
Glucose, Bld: 95 mg/dL (ref 70–99)
Potassium: 3.8 mEq/L (ref 3.5–5.1)
Sodium: 136 mEq/L (ref 135–145)

## 2022-10-17 LAB — CBC WITH DIFFERENTIAL/PLATELET
Basophils Absolute: 0 10*3/uL (ref 0.0–0.1)
Basophils Relative: 0.7 % (ref 0.0–3.0)
Eosinophils Absolute: 0.1 10*3/uL (ref 0.0–0.7)
Eosinophils Relative: 1.4 % (ref 0.0–5.0)
HCT: 39.4 % (ref 36.0–46.0)
Hemoglobin: 13 g/dL (ref 12.0–15.0)
Lymphocytes Relative: 12.7 % (ref 12.0–46.0)
Lymphs Abs: 0.5 10*3/uL — ABNORMAL LOW (ref 0.7–4.0)
MCHC: 32.9 g/dL (ref 30.0–36.0)
MCV: 86.6 fl (ref 78.0–100.0)
Monocytes Absolute: 0.4 10*3/uL (ref 0.1–1.0)
Monocytes Relative: 8.9 % (ref 3.0–12.0)
Neutro Abs: 3.2 10*3/uL (ref 1.4–7.7)
Neutrophils Relative %: 76.3 % (ref 43.0–77.0)
Platelets: 195 10*3/uL (ref 150.0–400.0)
RBC: 4.54 Mil/uL (ref 3.87–5.11)
RDW: 14.2 % (ref 11.5–15.5)
WBC: 4.2 10*3/uL (ref 4.0–10.5)

## 2022-10-17 LAB — HEPATIC FUNCTION PANEL
ALT: 15 U/L (ref 0–35)
AST: 19 U/L (ref 0–37)
Albumin: 4.5 g/dL (ref 3.5–5.2)
Alkaline Phosphatase: 43 U/L (ref 39–117)
Bilirubin, Direct: 0.2 mg/dL (ref 0.0–0.3)
Total Bilirubin: 1.1 mg/dL (ref 0.2–1.2)
Total Protein: 7.4 g/dL (ref 6.0–8.3)

## 2022-10-17 LAB — LIPID PANEL
Cholesterol: 153 mg/dL (ref 0–200)
HDL: 57.7 mg/dL (ref >39.00)
LDL Cholesterol: 66 mg/dL (ref 0–99)
NonHDL: 94.84
Total CHOL/HDL Ratio: 3
Triglycerides: 144 mg/dL (ref 0.0–149.0)
VLDL: 28.8 mg/dL (ref 0.0–40.0)

## 2022-10-17 LAB — HEMOGLOBIN A1C: Hgb A1c MFr Bld: 4.6 % (ref 4.6–6.5)

## 2022-10-17 LAB — TSH: TSH: 0.84 u[IU]/mL (ref 0.35–5.50)

## 2022-10-17 MED ORDER — ZEPBOUND 5 MG/0.5ML ~~LOC~~ SOAJ: 5.0000 mg | SUBCUTANEOUS | 0 refills | Status: DC

## 2022-10-17 MED ORDER — ZEPBOUND 7.5 MG/0.5ML ~~LOC~~ SOAJ: 7.5000 mg | SUBCUTANEOUS | 0 refills | Status: DC

## 2022-10-17 MED ORDER — ZEPBOUND 2.5 MG/0.5ML ~~LOC~~ SOAJ: 2.5000 mg | SUBCUTANEOUS | 0 refills | Status: DC

## 2022-10-17 NOTE — Progress Notes (Signed)
42 yo Female former smoker seen for pulmonary consult 05/10/2005 by Dr. Marchelle Gearing for shortness of breath and cough.  TEST Pulmonary function tests of 05/04/2015 normal  Autoimmune workup 05/06/2015 extensive panel is essentially normal except angiotensin-converting enzyme is elevated at 60 and c-ANCA  is trace positive at 1:40 but PR 3 and MPO antibodies are negative.  05/06/2015 she also had CT chest with contrast and high resolution CT chest without contrast: There classic pulmonary infiltrates and hilar and subcarinal lymphadenopathy. Overall pattern is consistent with sarcoidosis radiologically according to the radiologist. I personally visualized this film.   Walking desat test 185 feet  X 3 laps on RA -> did not desatrurate   06/29/2015 Follow up : Bx results Patient returns for a follow-up. Patient was seen last month for pulmonary consult for shortness of breath and cough and suspected sarcoid. Patient had been having cough, shortness of breath. Pulmonary function test was normal. An autoimmune panel in March was essentially negative except for cANCA that was trace positive. Ace level was elevated (PR-3 and MPO negative). CT chest showed pulmonary infiltrates and hilar and subcarinal lymphadenopathy. Patient was sent for a surgical consult. Patient underwent a bronchoscopy, endobronchial ultrasound and mediastinoscopy on 06/05/15 . Surgical pathology came back for nonnecrotizing granuloma and hyaline fibrosis in the lymph nodes. Lung biopsy showed no granulomas or malignancy. Patient was seen by [facility] care on April 7 for acute illness with chills and severe nausea. She was started on prednisone 40 mg. She says that her acute symptoms have resolved. She has not seen any change in her breathing. She continues to have fatigue, cough. She denies any rash, chest pain, orthopnea, PND, hemoptysis, orthopnea. She says that she has gained some weight since starting prednisone. We discussed  healthy dietary choices and exercise. She says that she does have some visual changes. Over the last year. Recommend that she make an appointment with ophthalmology for a annual exam and that she will need annual eye exams yearly. She is currently on prednisone 40 mg daily   REC pred and taper and hold at 10mg  per day  OV 08/10/2015  Chief Complaint  Patient presents with   Follow-up    Pt c/o occasional dry cough and SOB/chest tightness with exertion. Pt denies wheeze/CP. Pt states that her breathing is improving. She has noticed that weather does affect her breathing, mostly with extremes in air temperature and humidity.      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 At that point in time she already been on a few weeks of prednisone at 40 mg per day. At that visit 06/29/2015 she was better. She was asked to taper her prednisone and continue at 10 mg per day. However she ran out of the bottle and she stopped taking the prednisone one week ago. However it turns out that she is reluctant to take prednisone because of weight gain. She says she has gained 30 pounds of weight which is more than her pregnancy weight gain in the past. She does admit that prednisone helped her a lot. While shortness of breath cough chest tightness was all improved with prednisone significantly but now off prednisone for weeks symptoms are returning. The heat and humidity do bother her more significantly. Per hx - eeye exam - no sarcoid  Wondering about measures she could' do to coutneract weight gain   OV 11/16/2015  Chief Complaint  Patient presents with  Follow-up    been off steriods for x 1 month and more sob faster, dry cough, chest pain and some tightness   Follow-up stage II pulmonary sarcoidosis-diagnosis established March 2017  Last visit June 2017. We reinitiated prednisone. However she went to the beach and forgot to take  prednisone. She has not taken prednisone in over a month. With this she says that her mood swings are better. She still has some sleep issues. Being off prednisone initially she was fine but now she feels the shortness of breath and cough are coming back. She struggling between managing the side effects of prednisone and the symptoms of sarcoidosis. Last chest x-ray 06/05/2015 at the time diagnosis was made. Last pulmonary function test was in February 2017 for diagnosis was made. 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   OV 06/26/2017 Chief Complaint  Patient presents with   Follow-up    reports some SOB since weather has changed, overall states she has been okay.    41 year old female with stage II sarcoidosis on observation therapy  Presents for routine follow-up.  It has been over a year since I last saw her.  Overall in the past year she has been stable.  She continues to work Psychologist, sport and exercise at medical clinic.  She continues to feel shortness of breath on talking.  She feels like she cannot to get a good deep breath.  Long-acting beta agonist made her tachycardic.  Prednisone made her gain weight and since being off prednisone she has not lost that weight.  Prednisone also made her mood changes and these have improved/resolved after stopping prednisone.  She gets easily dyspneic on minimal exertion.  She says she is to be an athlete but is unable to do these activities.  She just feels like she needs to get a good deep breath.  She is open to the idea of having pulmonary stress test.  A cardiac stress test a few years ago apparently was normal.  She does not have any other clinical symptoms of sarcoidosis and she feels a sarcoidosis under control.  She is not keen on repeating a chest x-ray or having pulmonary function test.  Socially she has been busy getting her son into college and a son in high school and now  the son 86 years old    OV 12/15/2017  Subjective:  Patient ID: Madison Holt, female , DOB: 1980/03/09 , age 89 y.o. , MRN: [id number] , ADDRESS: [address] [name] [facility] [address]   12/15/2017 -   Chief Complaint  Patient presents with   Follow-up    Did not do the cpst due to cost breathing is about the same.     HPI REGENNA TEDRICK 42 y.o. 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  She is here for six-month follow-up.  She continues to have mild intermittent shortness of breath.  This happens episodically.  She takes Qvar and it goes away.  Heat makes it worse.  It almost sounds like asthma.  We recommended a pulmonary stress test but she has deferred it because of a high deductible cost to it.  She has not had a flu shot but will have it with her employer.  There are no other issues.  With the onset of fall she is actually feeling better.  At this point in time she just wants  supportive and expectant follow-up.  She is willing to try bronchodilator as long as it does not cause of tremors.  Albuterol causes tremors.  She is open to trying to Xopenex if you have a sample.  She is not sure she wants to afford or pay for Xopenex.  Especially now that she is feeling better.    OV 04/11/2018  Subjective:  Patient ID: Madison Holt, female , DOB: [birth year] , age 62 y.o. , MRN: [id number] , ADDRESS: [address] [name] [facility] [address]   04/11/2018 -   Chief Complaint  Patient presents with   Results    Chest CT   Stage II pulmonary sarcoidosis on observation therapy  HPI Madison Holt 42 y.o. -presents for follow-up.  At this point in time she feels stable with her baseline exertional dyspnea and fatigue.  There are no new issues other than the fact earlier in January 2020 she had respiratory infection.  With doxycycline and prednisone it improved and she is back to baseline.  Of note she did get Omnicef and discussed diarrhea and she had to stop.  She had  a chest x-ray during this time and there was concern for pulmonary infiltrates.  Therefore because it is been 3 years since we did a CT chest we got a CT chest and she is here to review.  I personally visualized the CT chest.  To me the mediastinal nodes and the pulmonary infiltrates appear stable and in fact the pulmonary infiltrates may be slightly improved.  According to the radiologist the mediastinal nodes are stable but the pulmonary infiltrates some areas have improved in some areas of worse.  We did spirometry today in the office and it shows continued stability in the last 2 years.  Results are below.  She wanted to discuss the implications of this.  She cannot handle methotrexate and prednisone.  Therefore she is averse to taking this.  At this point in time because of good quality of life other than mild shortness of breath and fatigue she is fairly content with observation therapy.  The bigger question is that on the CT chest hypodense lesions were seen in the spleen raising the question of splenic sarcoidosis.  She says she has palpitations for a long time.  She seen cardiology was done echocardiogram.  This was in [place] those records are not available.  The cardiologist at [facility] o clinic.  However she says a Holter monitor has not been done.  She had a recent eye exam which was normal.  However, she is worried about spread of sarcoidosis to the liver, brain and cardiac tissue.  We discussed about possible screening versus and also treatment of the splenic lesions.  We discussed the possibility of a second opinion at [facility] of [place] or [place].  But at this point in time we resolved that we would discuss locally   Results for KAMYLLE, DIDIER (MRN [id number]) as of 04/11/2018 09:03  Ref. Range 05/04/2015 09:59 03/29/2016 15:06 04/11/2018 [facility]  FVC-Pre Latest Units: L 3.86 3.71 3.7  FVC-%Pred-Pre Latest Units: % 103 99 102%  FEV1-Pre Latest Units: L 2.69 2.57 2.5/   FEV1-%Pred-Pre Latest Units: % 87 83 86%   Results for Madison, Holt (MRN [id number]) as of 04/11/2018 09:03  Ref. Range 05/04/2015 09:59 03/29/2016 15:06  DLCO unc Latest Units: ml/min/mmHg 21.37 21.71  DLCO unc % pred Latest Units: % 88 89   CT chest 04/09/2018 IMPRESSION: 1. Mild mediastinal and  bilateral hilar lymphadenopathy is stable to slightly increased since 05/06/2015 CT, compatible with sarcoidosis. 2. Upper lung predominant pulmonary parenchymal findings of sarcoidosis, with mild mixed changes including worsening confluent regions of bandlike and nodular consolidation in both lungs likely reflecting a combination of confluent nodularity and fibrosis, and with some improvement in the fine perilymphatic nodularity previously visualized. 3. Numerous hypodense splenic lesions, not previously discretely visualized (which may be due to technical differences in contrast timing), compatible with sarcoidosis.     Electronically Signed   By: Delbert Phenix M.D.   On: 04/10/2018 09:14  ROS - per HPI   OV 06/02/2020  Subjective:  Patient ID: Madison Holt, female , DOB: 23-Jul-1980 , age 43 y.o. , MRN: [id number] , ADDRESS: [address] [place] [address] PCP Hannah Beat, MD Patient Care Team: Hannah Beat, MD as PCP - General (Family Medicine)  This Provider for this visit: Treatment Team:  Attending Provider: [name], MD    06/02/2020 -   Chief Complaint  Patient presents with   Follow-up    Recently had an elevated d dimer and chest ct. Breathing has been doing well.  Breathing with the seasonal allergies and exertion at times but in all she is doing well.    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 -returns for follow-up.  Last seen 2 years ago.  She says overall she is been doing stable except with random episodes of mild shortness of breath and ongoing fatigue.  No real issues.  She  believes in early 2020 she might of had COVID-19 but at that time testing was not available.  She has had 2 shots of the Covid vaccine last 1 in June 2021.  After that she is not actively had COVID.  She continues to work at [employer] clinic in [place].  Then in mid February 2022 she had fever and diarrhea and stomach upset.  She saw outpatient urgent care.  D-dimer was elevated.  She then had CT angiogram chest that ruled out pulmonary embolism.  Of note the splenic lesion she had 2 years ago was resolved.  Her sarcoid lesions itself had improved on the CT.  After this GI symptoms have resolved.  At that time she did not have any Covid exposures.  She is back to baseline but she is concerned why she had a positive D-dimer and what to do about it.  She is here for follow-up.  She is in favor of continued supportive approach with her sarcoid as opposed to immunomodulatory treatment          CT Chest data - feb 2022  CLINICAL DATA:  Thoracic pain since Sunday, positive D-dimer today. History of shortness of breath and sarcoidosis.   EXAM: CT ANGIOGRAPHY CHEST WITH CONTRAST   TECHNIQUE: Multidetector CT imaging of the chest was performed using the standard protocol during bolus administration of intravenous contrast. Multiplanar CT image reconstructions and MIPs were obtained to evaluate the vascular anatomy.   CONTRAST:  75mL OMNIPAQUE IOHEXOL 350 MG/ML SOLN   COMPARISON:  Chest CT April 09, 2018.   FINDINGS: Cardiovascular: Satisfactory opacification of the pulmonary arteries to the segmental level. No evidence of pulmonary embolism. Normal heart size. No pericardial effusion.   Mediastinum/Nodes: Slightly decreased size of the borderline enlarged/prominent mediastinal and hilar lymph nodes. Index lymph nodes are as follows, right paratracheal lymph node now measures 8 mm previously 12 mm (series 7, image 30), right  subcarinal lymph node now measures 1.3 cm previously 1.6  cm (series 7, image 67), and the right hilar lymph node now measures 1.1 cm previously 1.3 cm (series 7, image 55). No axillary adenopathy. Again seen are multiple small hypodense thyroid nodules largest of which measures 1 cm.   Lungs/Pleura: No pneumothorax. No pleural effusion. No significant change in the patchy bilateral upper lobe predominant perilymphatic nodularity, which appears similar to prior study. Similar to slightly decreased bandlike and nodular foci of consolidation in bilateral lungs with associated mild volume loss and distortion, for instance in the posterior right upper lobe now measures 3.8 x 1.1 cm previously 4.5 x 1.7 cm (series 9, image 43), a right lower lobe pulmonary nodule which measures 6 mm, unchanged (series 9, image 65), and a left upper lobe nodule which now measures 5 mm previously 6 mm (series 9, image 62). No acute consolidative airspace opacities. No significant regions of traction bronchiectasis or frank honeycombing.   Upper Abdomen: Previously described hypodense lesions scattered throughout the spleen are not visualized today.   Musculoskeletal: No aggressive lytic or blastic lesion of bone.   Review of the MIP images confirms the above findings.   IMPRESSION: 1. No evidence of pulmonary embolism. 2. No significant change in the patchy bilateral upper lobe predominant perilymphatic nodularity and foci of bandlike and nodular foci of consolidation in bilateral lungs with associated mild volume loss and distortion, compatible with known sarcoidosis. 3. Slightly decreased size of the borderline enlarged/prominent mediastinal and hilar lymph nodes, also compatible with known sarcoidosis     Electronically Signed   By: Maudry Mayhew MD   On: 04/21/2020 15:46     PFT   OV 07/16/2021  Subjective:  Patient ID: Madison Holt, female , DOB: 05-23-1980 , age 30 y.o. , MRN: [id number] , ADDRESS: [address] [place]  [address] PCP Hannah Beat, MD Patient Care Team: Hannah Beat, MD as PCP - General (Family Medicine)  This Provider for this visit: Treatment Team:  Attending Provider: [name], MD    07/16/2021 -   Chief Complaint  Patient presents with   Follow-up    PFT performed today.  Pt states she is doing okay since last visit and denies any complaints.   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 -returns for 1 year follow-up.  Last seen in March 2022.  Continues to be stable.  Pulmonary function just shows continued normality.  Raw numbers might be down over time but this seems age-related.  She continues to have shortness of breath with exertion doing household work and incline.  She does not exercise much.  She also has intermittent "sigh" kind of shortness of breath where she has to take a deep breath.  In 2017 she did pulmonary rehab.  She is willing to try this again.  Review of the records indicate she has not had an echocardiogram.  There is no chest pain.  She had a second COVID in December 2022.  Last CT scan of the chest was a year ago.  There are no other new issues.  She continues to work in [employer].  Her 2 sons in college.  1 is working in Education officer, environmental and also attending college at the same time.  Her youngest is 78.  She has 1 in high school   OV 10/17/2022  Subjective:  Patient ID: Madison Holt, female , DOB: [birth year] , age 35 y.o. , MRN:  [id number] , ADDRESS: [address] [place]  [id number] PCP Hannah Beat, MD Patient Care Team: Hannah Beat, MD as PCP - General (Family Medicine)  This Provider for this visit: Treatment Team:  Attending Provider: [name], MD    10/17/2022 -   Chief Complaint  Patient presents with   Follow-up    6 months f/up, no complaints     HPI Madison Holt 42 y.o. -returns for annual follow-up.  She says overall she is doing well.   Approximately a month ago she developed radiculopathy in her left right side fourth and fifth digits.  She is seen neurology PA.  Apparently an MRI was ordered and nerve conduction studies ordered.  She was given prednisone.  Diagnosed with cervical radiculopathy.  I did review the external records.  No chest x-ray in 2 years.  But she did have a spinal x-ray.  There is no official report but the neurology PA said there is loss of cervical curvature and is most cervical straightening.  In terms of her sarcoid she is not had an x-ray in 2 years no CT scan.  She does not want to get imaging because of radiation risk unless absolutely needed.  She has not had a PFT in a year.  Last visit we talked about pulm rehabilitation but she said that "there is a lot going on" socially and she does not have time for it.  Of note she is going to become a grandmother.  His son who works in Education officer, environmental is married and is going to have a baby.  She is willing to have an echo.  We discussed this last time but she says she will have it done this time because of continued shortness of breath with stairs and talking on the phone and also sigh respiration.   SYMPTOM SCALE -  06/02/2020  07/16/2021   O2 use ra ra  Shortness of Breath 0 -> 5 scale with 5 being worst (score 6 If unable to do)   At rest 0 0  Simple tasks - showers, clothes change, eating, shaving 0 2  Household (dishes, doing bed, laundry) 2 2.5  Shopping 0 0  Walking level at own pace 2 2  Walking up Stairs 3 3.5  Total (30-36) Dyspnea Score 7 10  How bad is your cough? 0 0  How bad is your fatigue 5 5  How bad is nausea 4 - on med 3  How bad is vomiting?  0 0  How bad is diarrhea? 3 - on med 0  How bad is anxiety? 3 3  How bad is depression 0 0      PFT     Latest Ref Rng & Units 07/16/2021    9:50 AM 03/29/2016    3:06 PM 05/04/2015    9:59 AM  ILD indicators  FVC-Pre L 3.66  3.71  3.86   FVC-Predicted Pre % 99  99  103   FVC-Post L 3.61  3.78   3.90   FVC-Predicted Post % 98  101  104   TLC L 5.50  5.19  5.21   TLC Predicted % 108  102  103   DLCO uncorrected ml/min/mmHg 20.92  21.71  21.37   DLCO UNC %Pred % 96  89  88   DLCO Corrected ml/min/mmHg 20.92  20.49    DLCO COR %Pred % 96  84        LAB RESULTS last 96 hours No results found.  LAB RESULTS last 90 days No results found for this or any previous visit (from the past 2160 hour(s)).       has a past medical history of Pneumonia (2014), Sarcoidosis, and Shortness of breath dyspnea.   reports that she quit smoking about 24 years ago. Her smoking use included cigarettes. She started smoking about 29 years ago. She has a 5 pack-year smoking history. She has never used smokeless tobacco.  Past Surgical History:  Procedure Laterality Date   BILATERAL SALPINGECTOMY Bilateral 10/10/2016   Procedure: BILATERAL SALPINGECTOMY;  Surgeon: Christeen Douglas, MD;  Location: [facility] ORS;  Service: Gynecology;  Laterality: Bilateral;   ESOPHAGOGASTRODUODENOSCOPY     MEDIASTINOSCOPY N/A 06/05/2015   Procedure:  MEDIASTINOSCOPY;  Surgeon: Loreli Slot, MD;  [facility];  Service: Thoracic;  Laterality: N/A;   VAGINAL HYSTERECTOMY N/A 10/10/2016   Procedure: HYSTERECTOMY VAGINAL;  Surgeon: Christeen Douglas, MD;  Location: [facility] ORS;  Service: Gynecology;  Laterality: N/A;   VIDEO BRONCHOSCOPY WITH ENDOBRONCHIAL NAVIGATION N/A 06/05/2015   Procedure: VIDEO BRONCHOSCOPY WITH ENDOBRONCHIAL NAVIGATION;  Surgeon: Loreli Slot, MD;  Location: [facility] OR;  Service: Thoracic;  Laterality: N/A;   VIDEO BRONCHOSCOPY WITH ENDOBRONCHIAL ULTRASOUND N/A 06/05/2015   Procedure: VIDEO BRONCHOSCOPY WITH ENDOBRONCHIAL ULTRASOUND;  Surgeon: Loreli Slot, MD;  Location: [facility] OR;  Service: Thoracic;  Laterality: N/A;    Allergies  Allergen Reactions   Codeine Other (See Comments)    Other reaction(s): Hallucination   Albuterol     Pt feels shaky.    Omnicef [Cefdinir] Diarrhea     Immunization History  Administered Date(s) Administered   Influenza,inj,Quad PF,6+ Mos 12/21/2015, 01/03/2018   Influenza-Unspecified 12/06/2018, 12/20/2019   PFIZER(Purple Top)SARS-COV-2 Vaccination 07/19/2019, 08/13/2019   Tdap 05/29/2019    Family History  Problem Relation Age of Onset   Arthritis Mother    Hyperlipidemia Mother    Hyperlipidemia Father    Diabetes Father    Breast cancer Maternal Grandmother    Hypertension Maternal Grandmother    Colon cancer Maternal Grandfather    Hypertension Maternal Grandfather    Heart disease Paternal Grandfather      Current Outpatient Medications:    Butalbital-APAP-Caffeine 50-300-40 MG CAPS, TAKE 1 BY MOUTH EVERY 6 (SIX) HOURS AS NEEDED (HEADACHE)., Disp: 30 capsule, Rfl: 0   hyoscyamine (LEVSIN SL) 0.125 MG SL tablet, Place under the tongue every 6 (six) hours as needed., Disp: , Rfl:    ondansetron (ZOFRAN-ODT) 4 MG disintegrating tablet, Take 4 mg by mouth every 8 (eight) hours as needed., Disp: , Rfl:    pantoprazole (PROTONIX) 40 MG tablet, Take 40 mg by mouth daily., Disp: , Rfl:    valACYclovir (VALTREX) 1000 MG tablet, 2 tabs po 12 hours apart for 2 doses, Disp: 28 tablet, Rfl: 0      Objective:   Vitals:   10/17/22 0824  BP: (!) 120/90  Pulse: 81  SpO2: 99%  Weight: 149 lb 9.6 oz (67.9 kg)  Height: 5\' 3"  (1.6 m)    Estimated body mass index is 26.5 kg/m as calculated from the following:   Height as of this encounter: 5\' 3"  (1.6 m).   Weight as of this encounter: 149 lb 9.6 oz (67.9 kg).  @WEIGHTCHANGE @  [employer]   10/17/22 0824  Weight: 149 lb 9.6 oz (67.9 kg)     Physical Exam   General: No distress. Loooks well O2 at rest: no Cane present: no Sitting in wheel chair: no Frail: no Obese: no  Neuro: Alert and Oriented x 3. GCS 15. Speech normal Psych: Pleasant Resp:  Barrel Chest - no.  Wheeze - no, Crackles - no, No overt respiratory distress CVS: Normal heart sounds. Murmurs -  no Ext: Stigmata of Connective Tissue Disease - no HEENT: Normal upper airway. PEERL +. No post nasal drip        Assessment:       ICD-10-CM   1. Pulmonary sarcoidosis (HCC)  D86.0 ECHOCARDIOGRAM COMPLETE    Pulmonary function test    2. DOE (dyspnea on exertion)  R06.09 ECHOCARDIOGRAM COMPLETE    3. Sighing respiration  R06.89          Plan:     Patient Instructions     ICD-10-CM   1. 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 DOE (dyspnea on exertion)  R06.09     3. Sighing respiration  R06.89        -Clinically stable on CT scan of the chest 2022 - PFT normal May 2023 -Subjectively also stable with mild levels of random shortness of breath and sigh  Plan -Continue supportive care - can get echo next few weeks to several months at [facility] for completeness esp in setting of sarcoid   - we can order now and call with results - hold off on pulmonary rehab - do full PFT In 1 year  Folllwup 1 year or sooner if needed   FOLLOWUP No follow-ups on file.    SIGNATURE    Dr. [name], M.D., F.C.C.P,  Pulmonary and Critical Care Medicine Staff Physician, [facility] Center Director - Interstitial Lung Disease  Program  Pulmonary Fibrosis [facility] at [facility] [place], [place], [address]  Pager: [phone], If no answer or between  15:00h - 7:00h: call [phone] Telephone: [phone]  8:51 AM 10/17/2022   Moderate Complexity MDM OFFICE  2021 E/M guidelines, first released in 2021, with minor revisions added in 2023 and 2024 Must meet the requirements for 2 out of 3 dimensions to qualify.    Number and complexity of problems addressed Amount and/or complexity of data reviewed Risk of complications and/or morbidity  One or more chronic illness with mild exacerbation, OR progression, OR  side effects of treatment  Two or more stable chronic illnesses  One undiagnosed new problem with uncertain  prognosis  One acute illness with systemic symptoms   One Acute complicated injury Must meet the requirements for 1 of 3 of the categories)  Category 1: Tests and documents, historian  Any combination of 3 of the following:  Assessment requiring an independent historian  Review of prior external note(s) from each unique source  Review of results of each unique test  Ordering of each unique test    Category 2: Interpretation of tests   Independent interpretation of a test performed by another physician/other qualified health care professional (not separately reported)  Category 3: Discuss management/tests  Discussion of management or test interpretation with external physician/other qualified health care professional/appropriate source (not separately reported) Moderate risk of morbidity from additional diagnostic testing or treatment Examples only:  Prescription drug management  Decision regarding minor surgery with identfied patient or procedure risk factors  Decision regarding elective major surgery without identified patient or procedure risk factors  Diagnosis or treatment significantly limited by social determinants of health             HIGh Complexity  OFFICE  2021 E/M guidelines, first released in 2021, with minor revisions added in 2023. Must meet the requirements for 2 out of 3 dimensions to qualify.    Number and complexity of problems addressed Amount and/or complexity of data reviewed Risk of complications and/or morbidity  Severe exacerbation of chronic illness  Acute or chronic illnesses that may pose a threat to life or bodily function, e.g., multiple trauma, acute MI, pulmonary embolus, severe respiratory distress, progressive rheumatoid arthritis, psychiatric illness with potential threat to self or others, peritonitis, acute renal failure, abrupt change in neurological status Must meet the requirements for 2 of 3 of the  categories)  Category 1: Tests and documents, historian  Any combination of 3 of the following:  Assessment requiring an independent historian  Review of prior external note(s) from each unique source  Review of results of each unique test  Ordering of each unique test    Category 2: Interpretation of tests    Independent interpretation of a test performed by another physician/other qualified health care professional (not separately reported)  Category 3: Discuss management/tests  Discussion of management or test interpretation with external physician/other qualified health care professional/appropriate source (not separately reported)  HIGH risk of morbidity from additional diagnostic testing or treatment Examples only:  Drug therapy requiring intensive monitoring for toxicity  Decision for elective major surgery with identified pateint or procedure risk factors  Decision regarding hospitalization or escalation of level of care  Decision for DNR or to de-escalate care   Parenteral controlled  substances            LEGEND - Independent interpretation involves the interpretation of a test for which there is a CPT code, and an interpretation or report is customary. When a review and interpretation of a test is performed and documented by the provider, but not separately reported (billed), then this would represent an independent interpretation. This report does not need to conform to the usual standards of a complete report of the test. This does not include interpretation of tests that do not have formal reports such as a complete blood count with differential and blood cultures. Examples would include reviewing a chest radiograph and documenting in the medical record an interpretation, but not separately reporting (billing) the interpretation of the chest radiograph.   An appropriate source includes professionals who are not health care professionals but may be  involved in the management of the patient, such as a Clinical research associate, upper officer, case manager or teacher, and does not include discussion with family or informal caregivers.    - SDOH: SDOH are the conditions in the environments where people are born, live, learn, work, play, worship, and age that affect a wide range of health, functioning, and quality-of-life outcomes and risks. (e.g., housing, food insecurity, transportation, etc.). SDOH-related Z codes ranging from Z55-Z65 are the ICD-10-CM diagnosis codes used to document SDOH data Z55 - Problems related to education and literacy Z56 - Problems related to employment and unemployment Z57 - Occupational exposure to risk factors Z58 - Problems related to physical environment Z59 - Problems related to housing and economic circumstances [phone] - Problems related to social environment [phone] - Problems related to upbringing [phone] - Other problems related to primary support group, including family circumstances Z65 - Problems related to certain psychosocial circumstances Z65 - Problems related to other psychosocial circumstances

## 2022-10-17 NOTE — Patient Instructions (Addendum)
ICD-10-CM   1. 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 DOE (dyspnea on exertion)  R06.09     3. Sighing respiration  R06.89        -Clinically stable on CT scan of the chest 2022 - PFT normal May 2023 -Subjectively also stable with mild levels of random shortness of breath and sigh  Plan -Continue supportive care - can get echo next few weeks to several months at [facility] for completeness esp in setting of sarcoid   - we can order now and call with results - hold off on pulmonary rehab - do full PFT In 1 year  Folllwup 1 year or sooner if needed

## 2022-10-18 ENCOUNTER — Telehealth: Payer: Self-pay

## 2022-10-18 ENCOUNTER — Other Ambulatory Visit (HOSPITAL_COMMUNITY): Payer: Self-pay

## 2022-10-18 NOTE — Telephone Encounter (Signed)
Pharmacy Patient Advocate Encounter   Received notification from CoverMyMeds that prior authorization for Zepbound 2.5MG /0.5ML pen-injectors is required/requested.   Insurance verification completed.   The patient is insured through [employer] .   Per test claim: PA required; PA submitted to [employer] via CoverMyMeds Key/confirmation #/EOC [id number] Status is pending

## 2022-10-21 ENCOUNTER — Encounter: Payer: Self-pay | Admitting: Family Medicine

## 2022-10-25 ENCOUNTER — Ambulatory Visit: Payer: Managed Care, Other (non HMO) | Admitting: Internal Medicine

## 2022-10-25 MED ORDER — WEGOVY 0.25 MG/0.5ML ~~LOC~~ SOAJ: 0.2500 mg | SUBCUTANEOUS | 0 refills | Status: AC

## 2022-10-26 ENCOUNTER — Telehealth: Payer: Self-pay

## 2022-10-26 NOTE — Telephone Encounter (Signed)
No need to do anything else additional.  She wanted to try a script for [facility] to see if that would be approved.

## 2022-10-26 NOTE — Telephone Encounter (Signed)
Pharmacy Patient Advocate Encounter  Received notification from [employer] that Prior Authorization for Zepbound 2.5MG /0.5ML pen-injectors has been DENIED. Please advise how you'd like to proceed. Full denial letter will be uploaded to the media tab. See denial reason below.   PA #/Case ID/Reference #: [id number]   Denial Reason: No indication of a baseline BMI greater than or equal to 30 kg/m2, or a BMI greater than or equal to 27 kg/m2 and at least one weight-related comorbidities

## 2022-10-26 NOTE — Telephone Encounter (Signed)
Pharmacy Patient Advocate Encounter   Received notification from CoverMyMeds that prior authorization for [facility] 0.25MG /0.5ML auto-injectors is required/requested.   Insurance verification completed.   The patient is insured through [employer] .   Per test claim: PA required; PA submitted to [employer] via CoverMyMeds Key/confirmation #/EOC [facility] Status is pending

## 2022-10-31 ENCOUNTER — Encounter: Payer: Self-pay | Admitting: *Deleted

## 2022-10-31 NOTE — Telephone Encounter (Signed)
Pharmacy Patient Advocate Encounter  Received notification from [employer] that Prior Authorization for Wegovy 0.25MG /0.5ML auto-injectors has been DENIED. Please advise how you'd like to proceed. Full denial letter will be uploaded to the media tab. See denial reason below.   PA #/Case ID/Reference #: [id number]   Denial Reason: BMI is less than 27

## 2022-10-31 NOTE — Telephone Encounter (Signed)
Can you call and let her know - Madison Holt is also denied.  Basically, she her BMI is not high enough (she is not obese) for either Zepbound or Wegovy to be approved.

## 2023-01-19 ENCOUNTER — Other Ambulatory Visit: Payer: Self-pay | Admitting: Family Medicine

## 2023-01-19 MED ORDER — VALACYCLOVIR HCL 1 G PO TABS: ORAL_TABLET | ORAL | 3 refills | Status: AC

## 2023-01-19 NOTE — Telephone Encounter (Signed)
Last office visit 10/17/2022 for weight loss.  Last refilled 01/19/22 for #28 with no refills.  Next Appt: No future appointments.

## 2023-01-24 ENCOUNTER — Other Ambulatory Visit: Payer: Self-pay | Admitting: Family Medicine

## 2023-01-24 NOTE — Telephone Encounter (Signed)
Please submit PA.

## 2023-02-08 NOTE — Telephone Encounter (Signed)
Medication has previously been denied-patient does not meet the threshold for obesity.

## 2023-05-10 ENCOUNTER — Other Ambulatory Visit: Payer: Self-pay

## 2023-05-10 DIAGNOSIS — Z1231 Encounter for screening mammogram for malignant neoplasm of breast: Secondary | ICD-10-CM

## 2023-11-08 ENCOUNTER — Encounter: Payer: Self-pay | Admitting: Internal Medicine

## 2024-02-13 ENCOUNTER — Ambulatory Visit: Admitting: Internal Medicine

## 2024-02-13 DIAGNOSIS — D860 Sarcoidosis of lung: Secondary | ICD-10-CM

## 2024-05-14 ENCOUNTER — Ambulatory Visit: Admitting: Internal Medicine
